# Patient Record
Sex: Male | Born: 1935 | Race: Black or African American | Hispanic: No | Smoking: Former smoker
Health system: Southern US, Community
[De-identification: ages and names within clinical notes are randomized; demographics above are authoritative.]

## PROBLEM LIST (undated history)

## (undated) DIAGNOSIS — I509 Heart failure, unspecified: Secondary | ICD-10-CM

## (undated) DIAGNOSIS — N289 Disorder of kidney and ureter, unspecified: Secondary | ICD-10-CM

## (undated) DIAGNOSIS — F0390 Unspecified dementia without behavioral disturbance: Secondary | ICD-10-CM

## (undated) DIAGNOSIS — I10 Essential (primary) hypertension: Secondary | ICD-10-CM

## (undated) HISTORY — PX: CARDIAC VALVE SURGERY: SHX40

## (undated) HISTORY — PX: FEMORAL ARTERY STENT: SHX1583

## (undated) HISTORY — PX: CARDIAC SURGERY: SHX584

---

## 1998-05-05 ENCOUNTER — Inpatient Hospital Stay (HOSPITAL_COMMUNITY): Admission: AD | Admit: 1998-05-05 | Discharge: 1998-05-20 | Payer: Self-pay | Admitting: *Deleted

## 1998-07-14 ENCOUNTER — Ambulatory Visit (HOSPITAL_COMMUNITY): Admission: RE | Admit: 1998-07-14 | Discharge: 1998-07-14 | Payer: Self-pay | Admitting: *Deleted

## 1998-12-09 ENCOUNTER — Encounter: Payer: Self-pay | Admitting: Cardiology

## 1998-12-09 ENCOUNTER — Ambulatory Visit (HOSPITAL_COMMUNITY): Admission: RE | Admit: 1998-12-09 | Discharge: 1998-12-09 | Payer: Self-pay | Admitting: Cardiology

## 1998-12-28 ENCOUNTER — Encounter: Payer: Self-pay | Admitting: Urology

## 1998-12-28 ENCOUNTER — Ambulatory Visit (HOSPITAL_COMMUNITY): Admission: RE | Admit: 1998-12-28 | Discharge: 1998-12-28 | Payer: Self-pay | Admitting: Urology

## 1999-12-09 ENCOUNTER — Encounter (HOSPITAL_BASED_OUTPATIENT_CLINIC_OR_DEPARTMENT_OTHER): Payer: Self-pay | Admitting: General Surgery

## 1999-12-13 ENCOUNTER — Ambulatory Visit (HOSPITAL_COMMUNITY): Admission: RE | Admit: 1999-12-13 | Discharge: 1999-12-13 | Payer: Self-pay | Admitting: General Surgery

## 1999-12-13 ENCOUNTER — Encounter (INDEPENDENT_AMBULATORY_CARE_PROVIDER_SITE_OTHER): Payer: Self-pay | Admitting: Specialist

## 2000-04-07 ENCOUNTER — Ambulatory Visit (HOSPITAL_COMMUNITY): Admission: RE | Admit: 2000-04-07 | Discharge: 2000-04-07 | Payer: Self-pay | Admitting: Cardiology

## 2000-11-01 ENCOUNTER — Ambulatory Visit (HOSPITAL_COMMUNITY): Admission: RE | Admit: 2000-11-01 | Discharge: 2000-11-01 | Payer: Self-pay | Admitting: Cardiology

## 2001-12-20 ENCOUNTER — Encounter (INDEPENDENT_AMBULATORY_CARE_PROVIDER_SITE_OTHER): Payer: Self-pay | Admitting: *Deleted

## 2001-12-20 ENCOUNTER — Other Ambulatory Visit: Admission: RE | Admit: 2001-12-20 | Discharge: 2001-12-20 | Payer: Self-pay | Admitting: Urology

## 2001-12-26 ENCOUNTER — Encounter: Payer: Self-pay | Admitting: Urology

## 2001-12-26 ENCOUNTER — Encounter: Admission: RE | Admit: 2001-12-26 | Discharge: 2001-12-26 | Payer: Self-pay | Admitting: Urology

## 2002-02-07 ENCOUNTER — Ambulatory Visit: Admission: RE | Admit: 2002-02-07 | Discharge: 2002-02-07 | Payer: Self-pay | Admitting: Cardiology

## 2002-04-15 ENCOUNTER — Ambulatory Visit (HOSPITAL_COMMUNITY): Admission: RE | Admit: 2002-04-15 | Discharge: 2002-04-15 | Payer: Self-pay | Admitting: Neurosurgery

## 2002-04-15 ENCOUNTER — Encounter: Payer: Self-pay | Admitting: Neurosurgery

## 2002-06-12 ENCOUNTER — Ambulatory Visit (HOSPITAL_COMMUNITY): Admission: RE | Admit: 2002-06-12 | Discharge: 2002-06-12 | Payer: Self-pay | Admitting: Cardiology

## 2002-06-25 ENCOUNTER — Encounter: Payer: Self-pay | Admitting: Neurosurgery

## 2002-06-25 ENCOUNTER — Encounter: Admission: RE | Admit: 2002-06-25 | Discharge: 2002-06-25 | Payer: Self-pay | Admitting: Neurosurgery

## 2002-07-09 ENCOUNTER — Encounter: Payer: Self-pay | Admitting: Neurosurgery

## 2002-07-09 ENCOUNTER — Encounter: Admission: RE | Admit: 2002-07-09 | Discharge: 2002-07-09 | Payer: Self-pay | Admitting: Neurosurgery

## 2003-02-20 ENCOUNTER — Ambulatory Visit: Admission: RE | Admit: 2003-02-20 | Discharge: 2003-02-20 | Payer: Self-pay | Admitting: Cardiology

## 2003-02-20 ENCOUNTER — Encounter (INDEPENDENT_AMBULATORY_CARE_PROVIDER_SITE_OTHER): Payer: Self-pay | Admitting: Cardiology

## 2004-01-28 ENCOUNTER — Inpatient Hospital Stay (HOSPITAL_COMMUNITY): Admission: EM | Admit: 2004-01-28 | Discharge: 2004-02-14 | Payer: Self-pay | Admitting: Emergency Medicine

## 2004-01-30 ENCOUNTER — Encounter (INDEPENDENT_AMBULATORY_CARE_PROVIDER_SITE_OTHER): Payer: Self-pay | Admitting: Cardiology

## 2004-02-06 ENCOUNTER — Encounter (INDEPENDENT_AMBULATORY_CARE_PROVIDER_SITE_OTHER): Payer: Self-pay | Admitting: Specialist

## 2004-03-01 ENCOUNTER — Encounter (HOSPITAL_COMMUNITY): Admission: RE | Admit: 2004-03-01 | Discharge: 2004-05-27 | Payer: Self-pay | Admitting: Cardiology

## 2004-03-04 ENCOUNTER — Encounter: Admission: RE | Admit: 2004-03-04 | Discharge: 2004-03-04 | Payer: Self-pay | Admitting: Cardiothoracic Surgery

## 2005-09-09 ENCOUNTER — Encounter: Admission: RE | Admit: 2005-09-09 | Discharge: 2005-09-09 | Payer: Self-pay | Admitting: Nephrology

## 2005-09-25 IMAGING — CR DG CHEST 2V
2 series · 2 of 2 positions shown · non-contrast
Comparison: none

CLINICAL DATA: Pulmonary edema.  Follow-up coronary artery bypass grafts and valve replacement.
 TWO VIEW CHEST 
 PA and lateral views of the chest are made and are compared to the previous study of 02/10/04 at 8588 hours and show slightly poorer left basilar aeration.  There is also slight blunting of the left costophrenic angle.  There are again noted the coronary artery bypass grafts and the prostatic aortic valve.  No pneumothorax is seen.

[view not recorded (1 of 2)]
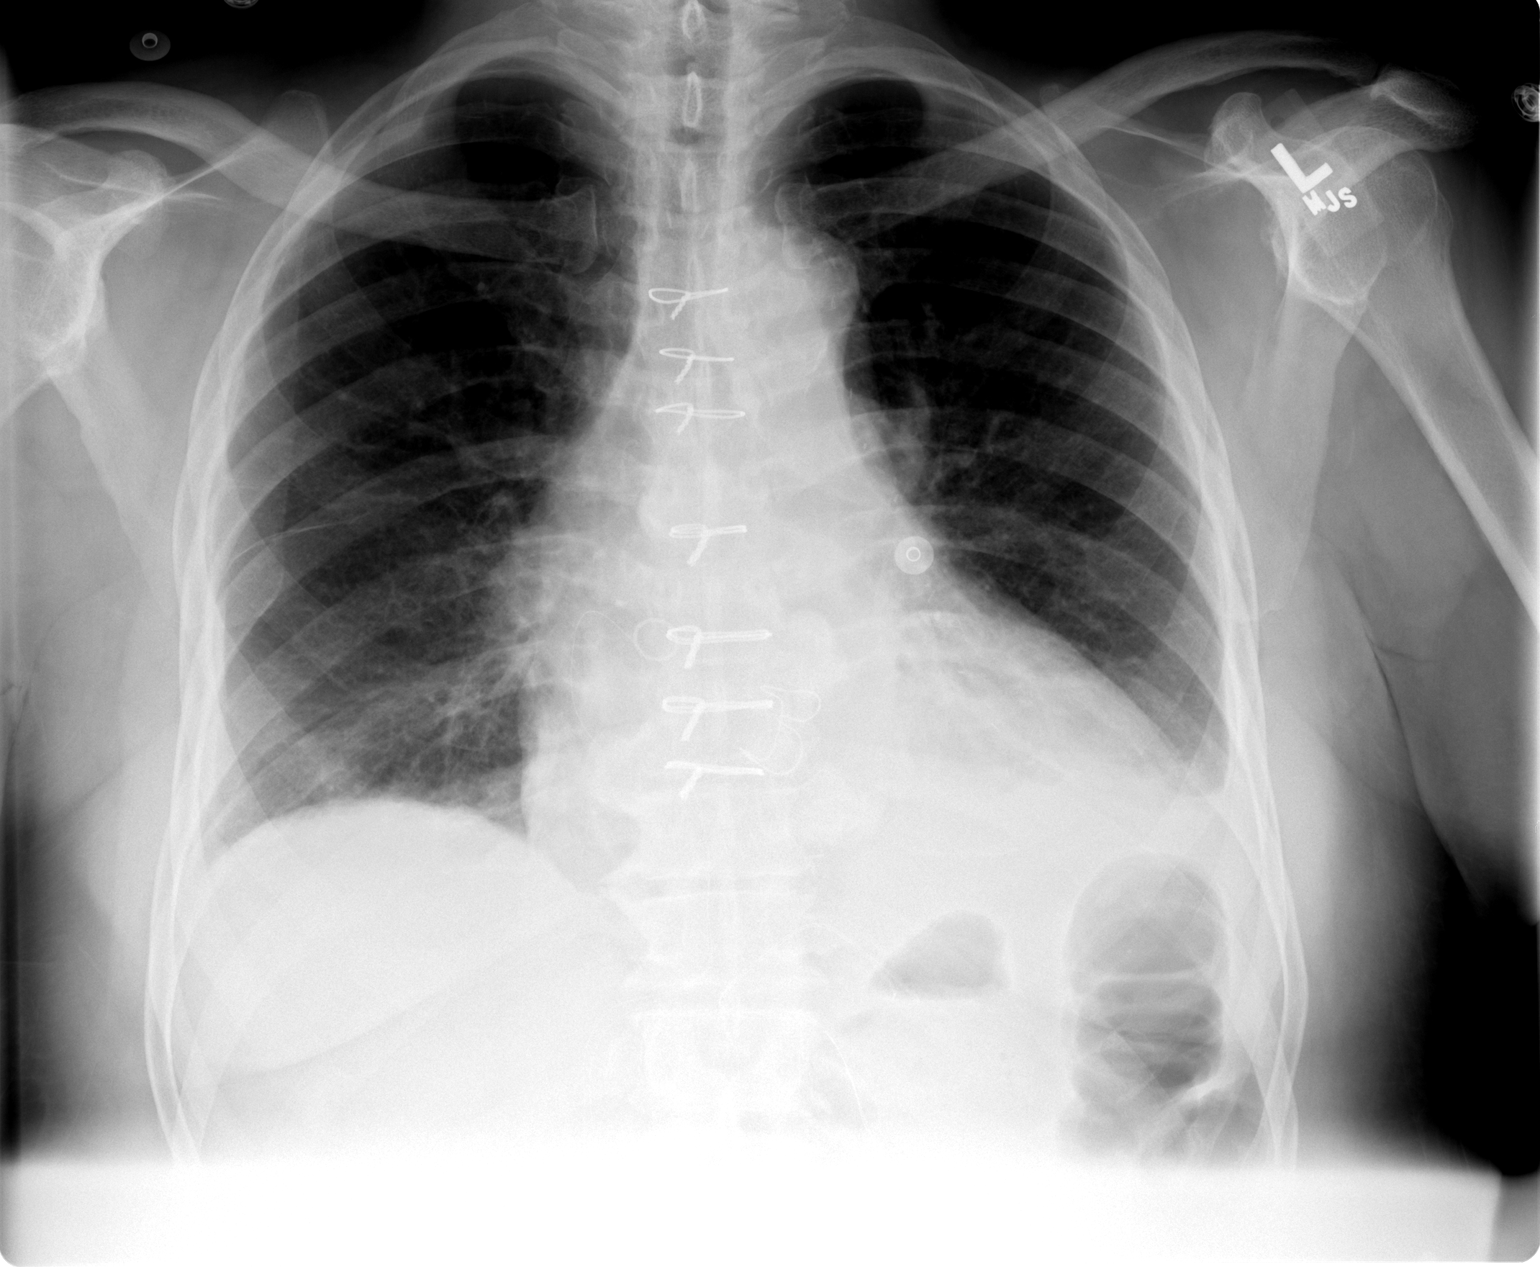

[view not recorded (2 of 2)]
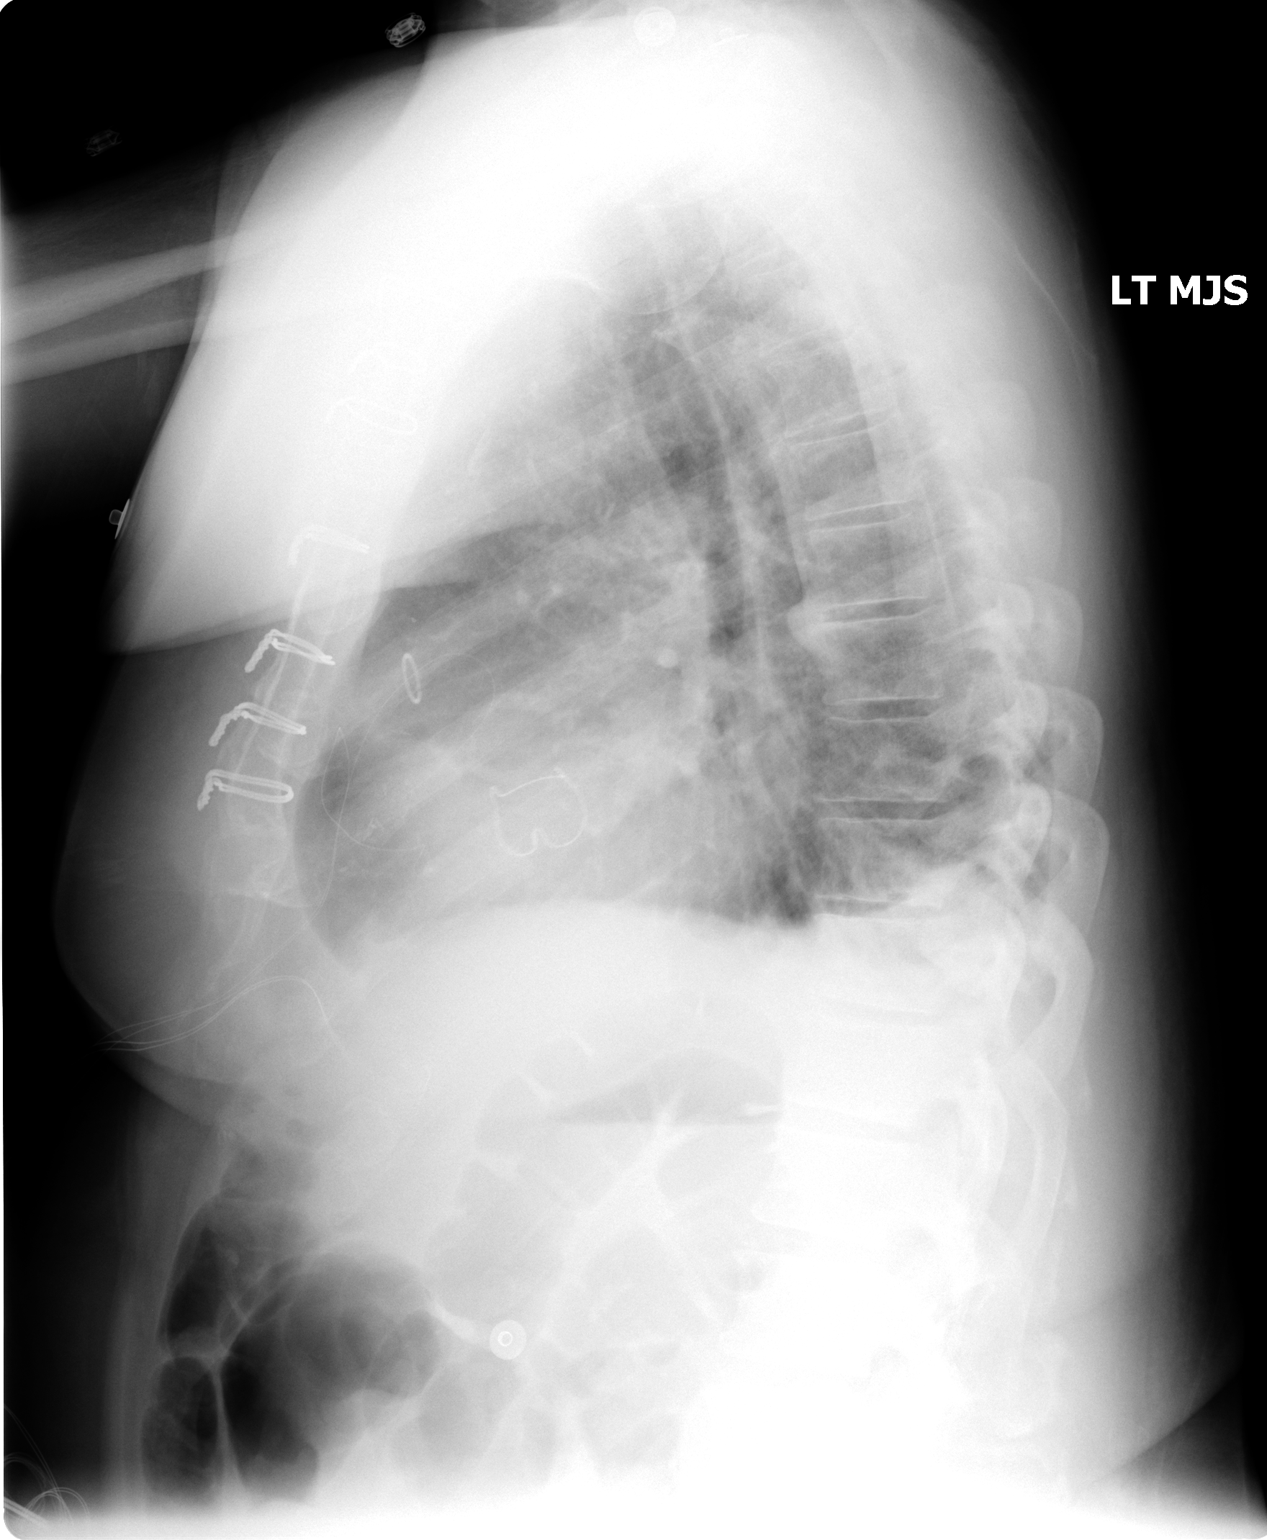

[2 of 2 positions shown; findings below may reference images not displayed]

IMPRESSION: Slightly poorer left basilar aeration.

## 2005-12-05 ENCOUNTER — Encounter: Admission: RE | Admit: 2005-12-05 | Discharge: 2005-12-05 | Payer: Self-pay | Admitting: Cardiology

## 2006-08-31 ENCOUNTER — Emergency Department (HOSPITAL_COMMUNITY): Admission: EM | Admit: 2006-08-31 | Discharge: 2006-08-31 | Payer: Self-pay | Admitting: Emergency Medicine

## 2006-09-03 ENCOUNTER — Inpatient Hospital Stay (HOSPITAL_COMMUNITY): Admission: EM | Admit: 2006-09-03 | Discharge: 2006-09-08 | Payer: Self-pay | Admitting: Emergency Medicine

## 2006-09-07 ENCOUNTER — Encounter: Admission: RE | Admit: 2006-09-07 | Discharge: 2006-09-07 | Payer: Self-pay | Admitting: Neurosurgery

## 2006-10-27 ENCOUNTER — Encounter: Admission: RE | Admit: 2006-10-27 | Discharge: 2006-10-27 | Payer: Self-pay | Admitting: Neurosurgery

## 2006-11-15 ENCOUNTER — Encounter: Admission: RE | Admit: 2006-11-15 | Discharge: 2006-11-15 | Payer: Self-pay | Admitting: Neurosurgery

## 2008-07-03 ENCOUNTER — Encounter: Admission: RE | Admit: 2008-07-03 | Discharge: 2008-07-03 | Payer: Self-pay | Admitting: Nephrology

## 2011-03-11 NOTE — Op Note (Signed)
NAME:  [name], [name]                     ACCOUNT NO.:  [id number]   MEDICAL RECORD NO.:  [id number]                   PATIENT TYPE:  INP   LOCATION:  2303                                 FACILITY:  [facility]   PHYSICIAN:  Gwenith Daily. Tyrone Sage, M.D.            DATE OF BIRTH:  [birth year]   DATE OF PROCEDURE:  02/06/2004  DATE OF DISCHARGE:                                 OPERATIVE REPORT   PREOPERATIVE DIAGNOSIS:  Critical aortic stenosis and coronary occlusive  disease.   POSTOPERATIVE DIAGNOSIS:  Critical aortic stenosis and coronary occlusive  disease.   SURGICAL PROCEDURE:  Aortic valve replacement with a #23 pericardial tissue  valve and coronary artery bypass grafting x2 with the left internal mammary  artery to the left anterior descending coronary artery and reversed  saphenous vein graft to the distal right coronary artery with right thigh  endovein harvesting.   SURGEON:  Gwenith Daily. Tyrone Sage, M.D.   FIRST ASSISTANT:  Rowe Clack, P.A.-C.   BRIEF HISTORY:  The patient is a 77 year old male who presented with a  sudden onset of pulmonary edema.  He was stabilized medically and evaluated  and found to have critical aortic stenosis at catheterization and also at  least a 70%  mid right coronary artery lesion and 50% to 60% lesions of  haziness just prior to and just distal to a stent placed 2-3 years ago in  the proximal LAD.  The patient had evidence of significant left ventricular  hypertrophy.  Because of his symptoms and critical aortic stenosis, aortic  valve replacement was recommended along with coronary artery bypass  grafting.  The patient agreed and signed informed consent.   DESCRIPTION OF PROCEDURE:  With Swan-Ganz and arterial line and monitors in  place, the patient underwent general endotracheal anesthesia without  incident.  Skin of the chest and legs was prepped with Betadine and draped  in the usual sterile manner.  A transesophageal echo probe was  placed and  confirmed that the patient did have some mitral regurgitation, however, the  leaflets were all intact and it was felt that this was a minor enough  regurgitation that replacement of the critical aortic stenosis would be all  that was necessary.  The patient was prepped and draped in the usual sterile  manner.  Using a [employer] endovein harvesting system, vein was harvested from  the right thigh.  A median sternotomy was performed.  The left internal  mammary artery was dissected down as a pedicle graft.  The distal artery was  divided and had good blood flow.  The pericardium was opened.  Overall  ventricular function was preserved, however, he did have evidence of severe  left ventricular hypertrophy.  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  With some difficulty, the patient was  then systemically heparinized.  The ascending aorta and  the right atrium  were cannulated.  The patient was placed on cardiopulmonary bypass at 2.4  L/min per sq m.  Right superior pulmonary vein vent was placed.  The  patient's body temperature was cooled to 30 degrees.  Aortic cross-clamp was  applied and 500 mL of cold blood and potassium cardioplegia were  administered with rapid diastolic arrest of the heart.  Myocardial septal  temperature was monitored throughout the cross-clamp period.  Attention was  turned first to the distal right coronary artery, which was opened.  Using a  running 7-0 Prolene, a distal anastomosis was performed with the second  reversed saphenous vein graft.  Attention was then turned to the mid-to-  distal left anterior descending coronary artery, which was opened.  Using a  running 8-0 Prolene, the left internal mammary artery was anastomosed to the  left anterior descending coronary.  Additional cold blood cardioplegia was  administered.  Attention was  then turned to the aortic valve.  A transverse  aortotomy was performed and this gave good visualization of a tricuspid,  highly calcified valve with the left and right coronary cusps almost  completely fused.  The valve was excised and all calcific debris was  removed.  The annulus was sized for a 23 pericardial tissue valve.  Consideration for a 25 Magnum was attempted, however, this valve still sized  for a 23.  Using #2 Tycron pledgeted sutures with the pledgets on the  ventricular surface, the valve was secured in placed without any impingement  on the coronary ostium.  Then the aorta was inspected for any loose calcific  debris.  The aortotomy was then closed with horizontal mattress, 3- and 4-0  Prolene sutures over felt strips.  With the cross-clamp still in place, a  single punch aortotomy was performed and a right proximal anastomosis of the  vein graft was performed.  The heart was allowed to passively fill and de-  air prior to completion of the final vein proximal anastomosis.  Aortic  cross-clamp was removed with a total cross-clamp time of 117 minutes.  The  patient spontaneously converted to a sinus rhythm.  His body temperature was  rewarmed to 37 degrees.  He had been maintained on renal-dose dopamine  throughout the case because of unknown preoperative renal insufficiency.  He  was ventilated and weaned from cardiopulmonary bypass without difficulty.  The transesophageal echo showed trace to 1+ MR, much improved from  preoperatively.  The aortic valve was functioning normally.  The patient was  decannulated in the usual fashion.  Protamine sulfate was administered.  With the operative field hemostatic, 2 atrial and 2 ventricular pacing wires  were applied and graft markers applied, left pleural tube and 2 mediastinal  tubes left in place.  The sternum was closed with #6 stainless steel wires,  fascia closed with interrupted 0 Vicryl and running 3-0 Vicryl in  the subcutaneous tissue, 4-0 subcuticular stitch in the skin edges.  Dry  dressings were applied.  Sponge and needle count was reported as correct at  completion of the procedure.   The patient had implanted a pericardial tissue value by [employer], model #2700, serial number [id number], 23-mm size.                                               Gwenith Daily Tyrone Sage, M.D.  EBG/MEDQ  D:  02/09/2004  T:  02/09/2004  Job:  [id number]   cc:   Eduardo Osier. Sharyn Lull, M.D.  [address]  [place]  [place] [address]  Fax: [phone]   [facility]

## 2011-03-11 NOTE — Discharge Summary (Signed)
NAME:  [name] [name], [name] [name]           ACCOUNT NO.:  [id number]   MEDICAL RECORD NO.:  [id number]          PATIENT TYPE:  INP   LOCATION:  1409                         FACILITY:  [facility]   PHYSICIAN:  Eduardo Osier. Sharyn Lull, M.D. DATE OF BIRTH:  Feb 24, 1933   DATE OF ADMISSION:  09/02/2006  DATE OF DISCHARGE:  09/08/2006                               DISCHARGE SUMMARY   ADMITTING DIAGNOSES:  1. Severe degenerative joint disease with herniation.  2. Coronary artery disease.  3. Hypertension.  4. Noninsulin-dependent diabetes mellitus controlled by diet.  5. Hypercholesteremia.  6. Mild renal insufficiency.   FINAL DIAGNOSES:  1. Severe degenerative joint disease with herniation.  2. Resolving bronchitis.  3. Hypertension.  4. Coronary artery disease status post aortic valve replacement.  5. Noninsulin-dependent diabetes mellitus controlled by diet.  6. Hypercholesteremia.  7. Mild renal insufficiency.  8. Status post nonsustained ventricular tachycardia.   DISCHARGED HOME MEDICATIONS:  1. Enteric-coated aspirin 81 mg 2 tablets daily.  2. Toprol XL 100 mg 1 tablet daily.  3. Altace 10 mg 1 capsule daily.  4. Norvasc 5 mg 1 tablet daily.  5. Avapro 300 mg 1 tablet daily.  6. Vytorin 10/40 1 tablet daily.  7. Hydrocodone/APAP 5/500 1-2 tablets every 8 hours as needed for      pain.   DIET:  Low salt, low cholesterol, 1800 calories ADA diet.  The patient  has been advised to avoid sweets and increase activity slowly as  tolerated with the help of crutches and walker.  OT, PT and home health  aid has been arranged.  Follow up with me in 1 week.  Follow up with Dr.  Sampson Goon. Venetia Maxon in 1 week.  Condition at discharge is stable.   BRIEF HISTORY AND HOSPITAL COURSE:  [name] is a 77 year old black  male with past medical history significant for severe degenerative joint  disease being followed by Dr. Franky Macho and Dr. Roxan Hockey in the past and  [facility], history of  CAD, history of severe aortic  stenosis requiring AVR, hypertension.  He was admitted by Dr. Shana Chute  because of severe low back pain and left leg pain.  The patient was seen  by Dr. Franky Macho and Dr. Roxan Hockey for the last 3-5 years and was treated  conservatively with epidural injection with improvement in his symptoms  in the past.  Denies any history of trauma or fall.  Denies any bladder  and bowel incontinence.   SOCIAL HISTORY:  Married, lives with his wife.  No history of smoking or  alcohol abuse.   PAST MEDICAL HISTORY:  As above.   PAST SURGICAL HISTORY:  He had CABG.  He had AVR a few years ago.  No  known drug allergies.   MEDICATION:  At home, he is on Altace, Avapro, Norvasc, Vicodin.   EXAMINATION:  His blood pressure was 192/97, pulse was 79.  Neck was  supple, no JVD.  Lungs were clear to auscultation without rhonchi or  rales.  CARDIOVASCULAR EXAM:  S1-S2 was normal.  There was metallic heart  sounds.  ABDOMEN:  Soft.  Bowel sounds present, nontender.  EXTREMITIES:  There was no clubbing, cyanosis or edema.  NEURO EXAM:  Was grossly intact.   LABS:  Cholesterol was 204, LDL 123, HDL 68, AST was 46, ALP was 43, ALT  was 43, ALP was 55, total bili 1.1, magnesium was 1.9.  Sodium 140,  potassium 4.0, chloride 106, bicarb 24, glucose 141, BUN 21, creatinine  1.6.  Hemoglobin was 15.9, hematocrit 46.6, white count of 9.0.  Repeat  CBC on September 05, 2006:  Hemoglobin was 15.3, hematocrit 45.1, white  count of 10.3.  On September 07, 2006:  Hemoglobin was 14.6, hematocrit  43, white count has come down to 7.6.  His MRI of LS spine showed  advanced multilevel degenerative spondylotic changes, multiple sites of  stenosis involving central canal, lateral recess and foramina, multiple  disk herniations.  There is a large primarily centered HNP at L2-L3 and  large HNP paracentrally and posterolateral knee on right L3-L4.  Essentially obliteration of L4-L5 disc with findings  suggestive of prior  interbody fusion.  Foraminal stenosis is noted at multiple levels  including on the right at L2-L3, L4-L5, and particularly L3-L4 and on  the left at L2-L3, L3-L4 and L5-S1 and to a lesser degree L4-L5.  Chest  x-ray showed cardiomegaly with no congestive heart failure.  Neuro found  scarring in the left lower chest, questionable nodular opacity in the  posterior aspect of the chest on the left.  The patient subsequently had  CT of the chest to evaluate for questionable capacity.  On September 04, 2006 it showed pleural thickening and parenchymal scarring in the  posterior and posterolateral hemithorax.  There was no focal pulmonary  mass.  There was dilatation of ascending aorta measuring 5 cm in  greatest diameter.   BRIEF HOSPITAL COURSE:  The patient was admitted to telemetry unit.  Neurosurgical consultation was obtained with Dr. Venetia Maxon.  The patient was  advised to go L3 selective nerve block with steroid injection.  The  patient subsequently underwent CT-guided L3 nerve block with improvement  in his symptoms.  The patient had a cough with yellowish phlegm and was  started on Avelox with improvement in his symptoms.  Due to questionable  left lower lobe opacity, the patient underwent CT of the chest which  showed no evidence of pulmonary mass.  OT/PT consultation was obtained.  The patient has been ambulating in hallway with assistance.  The patient  will be discharged home.  The patient was discharged home on above  medications and will be followed up by neurosurgery in 1 week and with  me in 2 weeks.  The patient was advised to go to the ER if he develops  any weakness in his leg or any incontinence.  The patient was discharged  in stable condition on September 08, 2006.           ______________________________  Eduardo Osier. Sharyn Lull, M.D.    MNH/MEDQ  D:  10/08/2006  T:  10/08/2006  Job:  [id number]

## 2011-03-11 NOTE — Consult Note (Signed)
NAME:  [name] [name], [name] [name]                     ACCOUNT NO.:  [id number]   MEDICAL RECORD NO.:  [id number]                   PATIENT TYPE:  INP   LOCATION:  3732                                 FACILITY:  [facility]   PHYSICIAN:  Gwenith Daily. Tyrone Sage, M.D.            DATE OF BIRTH:  Mar 12, 1933   DATE OF CONSULTATION:  02/02/2004  DATE OF DISCHARGE:                                   CONSULTATION   REQUESTING PHYSICIAN:  Dr. Sharyn Lull.   FOLLOWUP CARDIOLOGIST:  Dr. Sharyn Lull.   PRIMARY CARE PHYSICIAN:  Dr. Sharyn Lull.   REASON FOR CONSULTATION:  Severe aortic stenosis with flash pulmonary edema.   HISTORY OF PRESENT ILLNESS:  The patient is a 77 year old male who presents  with flash pulmonary edema manifested by severe shortness of breath. The  patient was noted he was driving to a local pharmacy to pick up some  medication when he became acutely short of breath with cough and  diaphoretic. EMS was called. On room air, his O2 saturations were 86%, and  he was brought to the emergency room, given BiPAP, morphine, nitroglycerin,  Lasix, and supplemental oxygen. His symptoms improved. Peak CK was 160 with  a MB of 2.9. Troponins were less than 0.5 x3.   The patient denies any syncope or near syncope. He denies any previous  shortness of breath; however, he has been retired for the past six years,  and his wife notes that he is not very active and primarily stays at home  and watches TV.   Previous cardiac history includes cardiac catheterization 1999. At that  time, he had stenosis of the LAD, and a stent was placed in the LAD. Cardiac  risk factors include hypertension. The patient was markedly hypertensive at  the time of admission with a diastolic pressure of 120. He has  hyperlipidemia. Has a positive family history for cardiac disease and  strokes. Both his mother and his father died at elderly ages with multiple  strokes. He has had one brother who has had bypass surgery. He denies  diabetes, is a current smoker one to two cigarettes a day, had smoked up to  a pack a day for 20 years but eight years ago cut back to one or two per  day. He denies claudication.   He does have baseline renal insufficiency in February 2000. His creatinine  was 1.8 on admission; on this admission 2.2; and decreased to 2 just today  prior to catheterization.   Past surgical history includes hernia repair on the right in 2000.   SOCIAL HISTORY:  The patient's married. He is retired from ____________.  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 Denies alcohol use.   MEDICATIONS ON ADMISSION:  1. Altace 10 mg a day.  2. Toprol-XL 50 mg a day.  3. Imdur 60 mg a day.  4. Lipitor 40 mg a day.  5. One aspirin a day.  ALLERGIES:  Denies any drug allergies.   REVIEW OF SYSTEMS:  CARDIAC:  The patient specifically denies chest pain,  resting shortness of breath. Does note exertional shortness of breath as  noted above. Denies orthopnea, syncope, palpitations, or lower extremities  edema. GENERAL:  The patient does note some constitutional symptoms. His  family notes that over the past several months he has not looked as well as  he had and has had an increasing cough for several months, nonproductive. He  denies any fevers or chills. GASTROINTESTINAL:  Denies any blood in his  stool. NEUROLOGICAL:  Denies TIAs or amaurosis. MUSCULOSKELETAL:  Has lower  lumbar disk disease which prevents him from doing heavy physical work. Does  have narrowing at L4-L5. GENITOURINARY:  He has had a history of prostate  difficulty in the past followed by Dr. Brunilda Payor. Denies any recent infections.  Denies any hematologic abnormalities. Denies polyuria, polydipsia, or other  evidence of hypothyroidism. Denies psychiatric history. Denies claudication.  HEENT:  The patient has partial dental replacements, see the dentist on a  regular basis-the last time was two months ago. He does wear glasses for   reading.   PHYSICAL EXAMINATION:  VITAL SIGNS:  His blood pressure is 114/64, pulse is  84 and regular, respiratory rate is 18. He is 5 feet 9 inches tall, weighs  214 pounds on admission and 209 yesterday.  GENERAL:  The patient is awake and neurologically intact and able relate his  history.  HEENT:  Pupils are equal, round, and reactive to light.  NECK:  With radiating bruits heard over both carotid arteries with brisk  upstroke.  LUNGS:  Clear bilaterally.  CARDIAC:  Reveals slightly irregular heart rhythm with a harsh holosystolic  murmur heard along the right sternal border radiating toward the apex of the  heart.  ABDOMEN:  Benign without palpable masses or organomegaly.  NEUROLOGICAL:  Grossly intact.  LYMPH NODES:  He has no cervical, supraclavicular lymph nodes.  SKIN:  Without ischemic changes.  VASCULAR:  Reveals right DP 1+, left DP 2+; right PT not palpable, left PT  1+. The right groin catheter site is without significant hematoma.   LABORATORY DATA:  White count 9.6, hemoglobin 15.5, hematocrit 46.4,  platelet count 144. PT 13.4, INR 1.0, PTT 30. Total cholesterol 148,  triglycerides 89, HDL 50, LDL 80. Sodium 141, potassium 3.9, BUN 28,  creatinine 2.1, glucose 108. On admission, his creatinine was 2.2. BNP is  elevated to 534. EKG is sinus rhythm with PVCs.   The patient had abdominal MRA which showed no evidence of renal artery  stenosis. 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   Echocardiogram and catheter films were reviewed. There is echographic  evidence of severe aortic stenosis with calculated valve area between 0.68  and 0.74 with preserved LV function At the time of catheterization, his PA  pressures were 53/32, a wedge of 36, cardiac index 2.4, left main is patent;  LAD in the area of the previously placed stent appears to be 50 to 60% narrowed on some views or 30 to 40% on others. The circumflex is small but  without significant disease. The right  coronary artery has a mid 60% lesion  in hit. The distal vessel is small.   IMPRESSION:  The patient admitted with flash pulmonary edema and probably  prodrome of decreasing activity over at least several months secondary to  significant aortic stenosis. The patient has some concomitant coronary  artery disease, though  is not what is participating his symptoms. I have  reviewed with the patient and his wife the diagnosis of critical aortic  stenosis and, because of his onset of symptoms, the need for aortic valve  replacement and consideration of bypass surgery. The risks and options were  discussed with the patient in detail including the risk of death, infection,  stroke myocardial infarction, bleeding, blood transfusion. We also discussed  the advantages and disadvantages of each of mechanical and tissue valves;  because of the patient's age, the best choice would probably  be a tissue valve. The patient is agreeable with this. Because of his  baseline elevated creatinine of 2.2 and catheterization this afternoon, we  will delay surgery until Wednesday, April 13, to ensure that he has no bump  in his creatinine following cardiac catheterization. We also obtained  carotid Doppler studies.                                               Gwenith Daily Tyrone Sage, M.D.    Tyson Babinski  D:  02/02/2004  T:  02/02/2004  Job:  [id number]

## 2011-03-11 NOTE — Cardiovascular Report (Signed)
NAME:  [name] [name], [name] [name]                     ACCOUNT NO.:  [id number]   MEDICAL RECORD NO.:  [id number]                   PATIENT TYPE:  INP   LOCATION:  3732                                 FACILITY:  [facility]   PHYSICIAN:  Mohan N. Sharyn Lull, M.D.              DATE OF BIRTH:  June 20, 1933   DATE OF PROCEDURE:  02/02/2004  DATE OF DISCHARGE:                              CARDIAC CATHETERIZATION   PROCEDURE:  Left and right cardiac catheterization with selective left and  right coronary angiography, insertion of Swan-Ganz catheter via right groin  using Judkins technique.   INDICATIONS:  [name] is a 77 year old black male with past medical  history significant for coronary artery disease, history of myocardial  infarction in the past, status post percutaneous transluminal coronary  angioplasty and stenting in 1999, hypertension.  Had moderately severe  aortic stenosis, hypercholesterolemia, mild renal insufficiency. He came to  the emergency room via EMS.  The patient states while driving to the  pharmacy he developed sudden onset of shortness of breath associated with  diaphoresis.  Bystander called EMS and was brought to the emergency room.  The patient denies any chest pain, nausea, vomiting.  Denies palpitation,  lightheadedness or syncope.  The patient denies any noncompliance to  medication or excessive salty food intake.  Denies any prolonged  immobilization.  States he has been having chest cold off and on with  associated cough and whitish phlegm.  Denies any sore throat, fever or  chills.  The patient was noted to be in acute pulmonary edema.  The patient  received IV Lasix, morphine sulfate, and IV nitrates and O2 by BiPAP with  good diuresis and improvement of symptoms.   PAST MEDICAL HISTORY:  As above, plus history of inferior wall myocardial  infarction.  Had Duet stent 3.0 x 18 mm in 1999.   PAST SURGICAL HISTORY:  Had right inguinal hernia in February 2001.   SOCIAL HISTORY:  He is married and has no children.  Smoked one pack per day  for 20 years, quit about eight years ago.  Now smokes occasionally one to  two cigarettes daily.  No history of alcohol use.  He worked at a [employer].  He has been retired since September 1988.  He was born in [place] and  lives in [place].   FAMILY HISTORY:  Father died of stroke at age of 12.  Mother died at age of  47 due to kidney failure.  One brother had coronary artery bypass grafting.   MEDICATIONS AT HOME:  1. Altace 10 mg p.o. daily.  2. Toprol 50 mg p.o. daily.  3. Imdur 60 mg p.o. daily.  4. Lipitor 40 mg p.o. daily.  5. Baby aspirin 81 mg p.o. daily.   ALLERGIES:  No known drug allergies.   PHYSICAL EXAMINATION:  GENERAL:  He was alert, awake, oriented x3 in mild  respiratory distress on BiPAP.  VITAL SIGNS:  Initial  blood pressure was 197/125, pulse 117, sinus tach on  the monitor.  HEENT:  Conjunctivae pink.  NECK:  Supple.  Positive jugular venous distention.  Decreased carotid  upstroke.  LUNGS:  He has bilateral rales with decreased breath sounds at bases.  CARDIOVASCULAR:  S1, S2 was present.  There was 3/6 systolic murmur and soft  S3 gallop.  ABDOMEN:  Soft.  Bowel sounds are present.  Nontender.  EXTREMITIES:  No clubbing, cyanosis or edema.   EKG showed normal sinus rhythm with septal myocardial infarction age  undetermined, minor ST depression in lateral leads.  There were no acute  ischemic changes noted.  His cardiac enzymes were negative.   BRIEF HOSPITAL COURSE:  The patient was admitted to CCU and was started on  IV heparin and nitrates and Lasix with good diuresis.  Myocardial infarction  was ruled out by serial enzymes and EKG.  The patient had two episodes of  nonsustained VT and was started on p.o. amiodarone.  The patient was also  noted to have p.o. amiodarone.  The patient also underwent MRA of the  abdomen due to severe uncontrolled hypertension and acute  pulmonary edema to  rule out renal artery stenosis which showed no evidence of renal artery  stenosis.  Due to multiple risk factors, severe aortic stenosis with aortic  valve area of 0.67 cm x 2-D echo with mean aortic valve gradient of 45 mmHg  with mildly depressed LV function and new onset of congestive heart failure.  Discussed with patient regarding left cath, its risks; i.e., death,  myocardial infarction, stroke, need for emergency coronary artery bypass  graft, local vascular complications, etc., and consented for the procedure.   PROCEDURE:  After obtaining informed consent, the patient was brought to the  cath lab and was placed on fluoroscopy table. Right groin was prepped and  draped in usual fashion.  2% Xylocaine was used for local anesthesia in the  right groin.  With the help of thin-wall needle, a 6-French arterial sheath  and 8 French venous sheaths were placed.  Both the sheaths were aspirated  and flushed.  Next, 7.5 French thermodilution Swan-Ganz  catheter was  advanced under fluoroscopic guidance up to the RA, RV, PA and wedge  position.  Pressures were recorded in above positions. Then, cardiac output  was done.  Subsequently, the Swan-Ganz catheter was pulled out.  Next, 6-  French left Judkins catheter was advanced over the wire under fluoroscopic  guidance up to the ascending aorta.  Wire was pulled out and the catheter  was aspirated and connected to the manifold. Catheter was further advanced  and engaged into left coronary ostium.  Multiple views of the left system  were taken.  Next, the catheter was disengaged and was pulled out over the  wire and was replaced with 6-French right Judkins catheter which was  advanced over the wire under fluoroscopic guidance to the ascending aorta.  Wire was pulled out, the catheter was aspirated and connected to the manifold.  Catheter was further advanced and engaged into right coronary  ostium.  Multiple views of the  right system were taken.  Next, the catheter  was disengaged and was pulled out over the wire and sheaths were aspirated  and flushed.   FINDINGS:  Left ventriculogram was not done due to documented severe aortic  stenosis and renal insufficiency.  Left main was patent.  LAD has 30-40%  sequential stenosis in proximal, mid and distal region.  Diagonal  one is  moderate size which has 20-30% proximal stenosis which bifurcates into  superior and inferior branch.  Diagonal two and diagonal three were very  small which were patent.  Left circumflex was small which was patent.  RCA  has 30-40% proximal and mid stenosis.   SWAN READINGS:  RA pressure was 11, RV 65/10, PA 53/32, pulmonary capillary  wedge pressure of 36.  Cardiac output was 5.2 liters per minute.  Cardiac  index was 2.4 liters per minute.   The patient received sublingual nitro spray prior to the procedure and 40 mg  of Lasix IV at the end of the procedure.  The patient tolerated the  procedure well and there were no complications.  The patient was transferred  to recovery room in stable condition.   PLAN:  Get CVTS consult for possible aortic valve replacement.                                               Eduardo Osier. Sharyn Lull, M.D.    MNH/MEDQ  D:  02/02/2004  T:  02/02/2004  Job:  [id number]

## 2011-03-11 NOTE — Consult Note (Signed)
NAME:  [name] [name], [name] [name]                     ACCOUNT NO.:  [id number]   MEDICAL RECORD NO.:  [id number]                   PATIENT TYPE:  INP   LOCATION:  3732                                 FACILITY:  [facility]   PHYSICIAN:  Terrial Rhodes, M.D.             DATE OF BIRTH:  Apr 13, 1933   DATE OF CONSULTATION:  02/03/2004  DATE OF DISCHARGE:                                   CONSULTATION   REQUESTING PHYSICIAN:  Dr. Tyrone Sage.   PRIMARY CARE PHYSICIAN:  Dr. Sharyn Lull.   REASON FOR CONSULTATION:  Increasing creatinine.   HISTORY OF PRESENT ILLNESS:  The patient is a 77 year old black male with a  history of CAD status post MI and PCI, hypertension, aortic stenosis, and  renal insufficiency who underwent a cardiac catheterization April 11  following admission for acute shortness of breath associated with  diaphoresis but without chest pain, nausea, or vomiting. The patient  reported outpatient compliance with his medications. He received IV Lasix  with good diuresis and improvement of symptoms. An acute MI was ruled out by  serial enzymes and EKGs. During hospitalization, he has had two episodes of  nonsustained ventricular tachycardia and has been placed on amiodarone. A  MRA  of the abdomen was done secondary to his severe hypertension (diastolic  blood pressure of 120 on admission) and showed no renal artery stenosis. A  cardiac catheterization showed 30 to 40% stenosis of LAD, 20 to 30% stenosis  of diagonal #1, and 30 to 40% stenosis of RCA. Plan is for aortic valve  replacement and CABG on April 13, but CVTS requests renal consult for  elevated creatinine.   The patient denies any history of kidney problems except occasionally being  told by his primary that his kidneys are not working normally. He is  currently without chest pain, shortness of breath, or difficulties voiding.   PAST MEDICAL HISTORY:  1. CAD status post inferior wall MI, PCI/stenting (1999) and status post  catheterization (February 02, 2004).  2. Hypertension.  3. Severe aortic stenosis.  4. Hypercholesterolemia.  5. Renal insufficiency with unknown baseline.   PAST SURGICAL HISTORY:  1. PCI/stenting in 1999.  2. Right inguinal hernia repair February 2001.   MEDICATIONS:  Outpatient medications:  1. Altace 10 mg p.o. daily.  2. Toprol 50 mg p.o. daily.  3. Imdur 60 mg p.o. daily.  4. Lipitor 40 mg p.o. daily.  5. Aspirin 81 mg p.o. daily.   Current inpatient medications:  1. Metoprolol.  2. K-Dur.  3. Protonix.  4. Aspirin.  5. Lipitor.  6. Xopenex.  7. Lasix.  8. Amiodarone.  9. Nitroglycerin patch.  10.      Rocephin (day #4).  11.      Morphine sulfate as needed.  12.      Normal saline lock IV.   ALLERGIES:  No known drug allergies.   SOCIAL HISTORY:  The patient is married and has no children. She  now smokes  1 to 2 cigarettes a day for the past 8 years after smoking 1 pack a day for  20 years previously. No history of alcohol use. He is a retried Nutritional therapist.   FAMILY HISTORY:  His father died of a stroke at age 28. His mother died at  age 41 and also suffered from strokes and heart valve problems. His brother  is status post CABG. The patient denies any family history of kidney  problems.   REVIEW OF SYSTEMS:  No nausea or vomiting, dyspnea on exertion, lower  extremity edema. No hematuria or dysuria. He has had increased urine output  secondary to his Lasix.   PHYSICAL EXAMINATION:  VITAL SIGNS:  Current temperature 97.3-maximum  temperature 97.7, blood pressure 117/61 with systolics in the 110s to 130s,  heart rate 77, respiratory rate 20, O2 saturation 100% on room air, weight  209.4 pounds. Urine output recorded at 650 cc in 24 hours.  GENERAL:  He is a well kept older black male, pleasant, no acute distress,  ambulating without difficulty.  HEENT:  PERRL, EOMI. Mucous membranes are clear without lesions.  NECK:  No JVD. Strong carotid upstrokes bilaterally.   CARDIOVASCULAR:  Harsh holosystolic murmur radiating up into the carotid.  Difficulty hearing S2. Regular rate. Two plus and symmetrical radial pulses.  LUNGS:  Clear to auscultation bilaterally. No wheezes, rhonchi, or rales.  Good air flow.  ABDOMEN:  Obese, soft, nontender, nondistended, normal bowel sounds.  EXTREMITIES:  No edema. Moves all extremities equally.   LABORATORY DATA:  UA showed a specific gravity of 1.023, pH 5, small blood,  was otherwise negative. Urine micro showed rare squames, 0 to 2 red blood  cells, no white blood cells or bacteria. White count 8.9, hemoglobin 15,  hematocrit 44, platelets 130. Serum sodium 142, potassium 4.8, chloride 108,  bicarb 26, BUN elevated at 28, creatinine elevated at 2.3, glucose 109,  calcium 9. Total protein 6.3, albumin low at 2.8. The rest of the liver  panel normal. Mag 1.8 on April 8. TSH within normal limits. Imaging:  MRA of  the abdomen on April 8 showed no evidence of renal artery stenosis. Small  renal cysts were noted.   Two-view chest x-ray on April 8 showed improving CHF. Cardiomegaly. Small  bilateral pleural effusions, left greater than right. CT of the abdomen with  and without contrast in March of 2003 showed renovascular calcification  bilaterally. No hydronephrosis. Several lower attenuation structures  consistent with renal cyst (left greater than right) about 2 cm in maximum  diameter.   ASSESSMENT/PLAN:  A 77 year old black male with significant aortic stenosis,  poorly controlled hypertension, history of renal insufficiency admitted for  flash pulmonary edema with planned aortic valve replacement and coronary  artery bypass grafting tomorrow but with elevated creatinine.   1. Renal insufficiency. Creatinine was 2.2 on admission, decreasing to 2 on     April 8, and since then trending up to 2.1 on April 9, 2.2 on April 11,     and 2.3 today. Unsure of his baseline creatinine although in February    2001 he had  a creatinine of 1.5 and a BUN of 18 per hospital computer     records. No renal artery stenosis was seen on MRA. His urine is without     protein but does have a small amount of blood. He is on Lasix secondary     to pulmonary edema with good urine output. He is not  an ACE, ARB, or     NSAID. Would like to see his creatinine plateau prior to surgery.  2. Severe aortic stenosis/coronary artery disease. Plan is for aortic valve     replacement/coronary artery bypass grafting tomorrow. Will discuss with     Dr. Tyrone Sage whether the surgery can be postponed until his creatinine     stabilizes. Management per CVTS and cardiology.  3. Hypertension. Good control in the past 24 hours on current medications.     Follow.  4. Disposition. Per primary team. Will discuss with Dr. Arrie Aran and     follow patient along with you.     Georgina Peer, M.D.                 Terrial Rhodes, M.D.    JM/MEDQ  D:  02/04/2004  T:  02/05/2004  Job:  [id number]   cc:   Gwenith Daily. Tyrone Sage, M.D.  [address]  [place]  [place] [address]   [facility]

## 2011-03-11 NOTE — Op Note (Signed)
[facility]  Patient:    [name] [name], [name] [name]                  MRN: [id number] Proc. Date: 12/13/99 Adm. Date:  [id number] Attending:  Sonda Primes                           Operative Report  PREOPERATIVE DIAGNOSIS:  Right inguinal hernia.  POSTOPERATIVE DIAGNOSIS:  Right direct and indirect inguinal herniae.  PROCEDURE:  Right inguinal herniorrhaphy with Marlex mesh.  SURGEON:  Mardene Celeste. Lurene Shadow, M.D.  ASSISTANT:  Nurse.  ANESTHESIA:  MAC; I used 1% Xylocaine with epinephrine.  NOTE:  This patient is a 77 year old man presenting with a large, sometimes painful groin bulge which is easily reducible on palpation.  He is brought to the operating room now for repair.  DESCRIPTION OF PROCEDURE:  Following induction of satisfactory sedation, with the patient positioned supine, the lower abdomen was prepped and draped to be included in the sterile operative field and then the lower abdominal crease was infiltrated with 1% Xylocaine with epinephrine.  I made the incision just slightly lower than usual because of an area within the lower skin fold which looked as if the area was infected with some dermal fungus.  This incision was then deepened through the kin and subcutaneous tissues and carried down to the external oblique aponeurosis.  Additional injections of Xylocaine were used to maintain analgesia throughout the procedure.  The external oblique aponeurosis was opened down through the external inguinal ring, with protection of the ilioinguinal nerve, which was retracted inferiorly and laterally.  Spermatic cord was elevated and held with a Penrose drain and freed from Hesselbachs triangle.  A very large direct inguinal hernia  sac was located on the anteromedial aspect of the cord and this was dissected free and carried up to the internal ring and opened.  On opening the cord, there was a sliding component made primarily  of intraperitoneal fat, which was dissected free from the sac and reduced into the peritoneal cavity.  The sac was then closed with pursestring sutures of 2-0 silk and the redundant sac was amputated and forwarded for pathologic evaluation.  The Hesselbachs triangle defect was then closed with an onlay patch of Marlex mesh, sewn into the pubic tubercle and carried up along the conjoined tendon, up to the internal ring, and again from the pubic tubercle up along the shelving edge of Pouparts ligament, up to the internal ring, with the  mesh being split so as to allow passage of the cord through the split.  Tails of the mesh were then trimmed and sutured down to the internal oblique muscles. Sponge, instrument and sharp counts were then verified and the wound then closed in layers as follows:  The external oblique aponeurosis was closed over the cord with a running suture of 2-0 Vicryl, Scarpas fascia was closed with running suture of 0 Vicryl and skin was closed with a running suture of 4-0 Monocryl, then reinforced with Steri-Strips.  Sterile dressing was applied.  Anesthetic was reversed. Patient was moved from the operating room to the recovery room in stable condition, having tolerated the procedure well. DD:  12/13/99 TD:  12/13/99 Job: [id number]

## 2011-03-11 NOTE — Consult Note (Signed)
NAME:  [name] [name], ROOP NO.:  [id number]   MEDICAL RECORD NO.:  [id number]          PATIENT TYPE:  INP   LOCATION:  1409                         [facility]ACILITY[facility]   PHYSICIAN:  Danae Orleans. Venetia Maxon, M.D.  DATE OF BIRTH:  [birth year]   DATE OF CONSULTATION:  09/04/2006  DATE OF DISCHARGE:                                   CONSULTATION   REASON FOR CONSULTATION:  Severe left leg pain and low back pain.   HISTORY OF PRESENT ILLNESS:  Lamin Chandley is a 77 year old man with a  long history of low back and left leg pain.  He previously was a patient of  Dr. Corlis Hove, then treated by Dr. Coletta Memos, both from my  practice.  The patient has had multiple episodes of left leg pain managed  with bedrest and epidural steroid injections.  He was hospitalized by Dr.  Roxan Hockey in 1998.  His last series of epidural steroid injections was in  2004.  He is now admitted with severe left thigh pain increased with  standing.  He denies bowel or bladder dysfunction.  He has a history of  coronary disease, status post aortic valve replacement, and hypertension.  I  have included his old records for review from our practice including his  previous admission note.   PHYSICAL EXAMINATION:  GENERAL:  On examination, the patient is awake, alert  and fully oriented.  He speaks with clear and fluent speech.  He has intact  short and long-term memory.  NEUROLOGIC:  Pupils are equal, round and reactive to light.  Extraocular  movements are intact.  Facial sensation and facial motor intact and  symmetric.  Hearing is intact.  Palate is upgoing.  Shoulder shrug is  symmetric.  Tongue protrudes in the midline.  He has full bilateral upper  extremity and lower extremity strength except for decreased left hip flexion  and knee extension, which are mildly weak.  He has decreased sensation to  pin and light touch in the left thigh.  No hip irritability.  Reflexes are 2  in the biceps, triceps  and brachial radialis, 2 at the right knee, 1 at the  left knee, 2 at the ankles.  He has downgoing toes to stimulation.  He has  mild sciatic notch discomfort on the left.   His MRI demonstrated a central and bi-foraminal stenosis L2-3, and left  facet arthropathy L3-4, severe spondylosis L2-3, L3-4, L4-5 and L5-S1  levels.   IMPRESSION:  Selig Wampole is a 77 year old man with severe left leg  pain with standing and also with low back pain.  He had a similar episode in  1998 and 2004 with severe degenerative changes on his MRI.   RECOMMENDATIONS:  I have recommended to Mr. Enriques that he undergo a left  L3 selective nerve block to see if this will help relieve his pain.  I have  recommended that he undergo steroid injections and if he does not get any  relief, he may require surgical intervention.  I will follow the patient.   Please call me with any questions.  Danae Orleans. Venetia Maxon, M.D.  Electronically Signed     JDS/MEDQ  D:  09/04/2006  T:  09/05/2006  Job:  [id number]   cc:   Osvaldo Shipper. Spruill, M.D.  Eduardo Osier. Sharyn Lull, M.D.

## 2011-03-11 NOTE — Discharge Summary (Signed)
NAME:  [name] [name], [name] [name]           ACCOUNT NO.:  [id number]   MEDICAL RECORD NO.:  [id number]          PATIENT TYPE:  INP   LOCATION:  1409                         FACILITY:  [facility]   PHYSICIAN:  Eduardo Osier. Sharyn Lull, M.D. DATE OF BIRTH:  03/31/1933   DATE OF ADMISSION:  09/02/2006  DATE OF DISCHARGE:  09/08/2006                                 DISCHARGE SUMMARY   ADMITTING DIAGNOSES:  Severe back pain, rule out herniated disk,  hypertension, coronary artery disease, status post CABG, status post AVR.   FINAL DIAGNOSES:  Severe degenerative joint disease, status post femoral  nerve block, resolving bronchitis, hypertension, coronary artery disease  status post coronary artery bypass graft/AVR, non-insulin-dependent diabetes  mellitus controlled by diet, hypercholesterolemia, mild renal insufficiency,  status post nonsustained ventricular tachycardia, asymptomatic.   DISCHARGE HOME MEDICATIONS:  Enteric-coated aspirin 81 mg 2 tabs a day,  Toprol-XL 100 mg daily, __________ , Avapro 300 mg 1 tablet daily, Lipitor  20 mg 1 tablet daily, hydrocodone/APAP 5/500 mg 2 tablets every 8 hours as  needed.   FOLLOWUP:  Follow up with me in 2 weeks, follow up with Dr. Sampson Goon.  Venetia Maxon in 1 week.   CONDITION ON DISCHARGE:  Stable.  __________  hypertension, hypercholesterolemia, diabetes mellitus controlled  by diet, mild renal insufficiency, __________ .  The patient has been seen  by Dr. Franky Macho in the past and __________   PAST MEDICAL HISTORY:  As above.   PAST SURGICAL HISTORY:  As above.   PHYSICAL EXAMINATION:  Blood pressure was 192/97, afebrile.  Neck supple.  No bruits.  Lungs clear to auscultation.  __________   LABORATORY DATA:  MRI of the back showed __________           ______________________________  Eduardo Osier. Sharyn Lull, M.D.     MNH/MEDQ  D:  09/08/2006  T:  09/09/2006  Job:  [id number]

## 2011-03-11 NOTE — Discharge Summary (Signed)
NAME:  [name] [name], [name] [name]                     ACCOUNT NO.:  [id number]   MEDICAL RECORD NO.:  [id number]                   PATIENT TYPE:  INP   LOCATION:  2029                                 FACILITY:  [facility]   PHYSICIAN:  Gwenith Daily. Tyrone Sage, M.D.            DATE OF BIRTH:  1932-12-04   DATE OF ADMISSION:  01/28/2004  DATE OF DISCHARGE:  02/14/2004                                 DISCHARGE SUMMARY   ADMISSION DIAGNOSIS:  Flash pulmonary edema.   ADDITIONAL DIAGNOSES/DISCHARGE DIAGNOSES:  1. Known history of coronary artery disease status post stent placement of     the left anterior descending in 1999.  2. History of uncontrolled hypertension.  3. Hyperlipidemia.  4. Acute on chronic renal insufficiency during this admission.  5. History of myocardial infarction in 1999.  6. History of moderately severe aortic stenosis.  7. History of tobacco abuse.  8. Status post right inguinal hernia repair in 2001.  9. Postoperative supraventricular tachycardia, now on Coumadin therapy.  10.      Status post aortic valve replacement and coronary artery bypass     graft completed on February 06, 2004.  11.      Postoperative anemia.  12.      Postoperative elevated liver function tests, trending towards     normal at the time of discharge.   HOSPITAL PROCEDURES/MANAGEMENT:  1. IV Lasix therapy for flash pulmonary edema and exacerbation of congestive     heart failure.  2. MRI/MRA of the kidneys which showed no evidence of renal artery stenosis.  3. A 2-D echocardiogram completed on January 30, 2004.  4. Arterial evaluation including bilateral carotid duplex, palmar arch test,     and ABIs completed on February 03, 2004.  5. Aortic valve replacement utilizing a 23 mm pericardial bioprosthetic     valve and coronary artery bypass grafting x2, completed on February 06, 2004.  6. Initiation of cardiac rehab phase 1.   CONSULTATIONS:  1. Renal service.  2. Cardiac rehab.  3. Case management.   HISTORY OF PRESENT ILLNESS:  Mr. [name] [name] is a 77 year old black male with a  Past Medical History significant for coronary artery disease and a history  of myocardial infarction status post PTCA and stenting in 1999 as well as  hypertension, moderately severe aortic stenosis, hypercholesterolemia, and  mild renal insufficiency.  The patient presented to the emergency department  via EMS with complaints of sudden onset of shortness of breath associated  with diaphoresis.  At the time of presentation, the patient denied any chest  pain, nausea, vomiting, or palpitations.  At the time of presentation to the  emergency department, the patient's O2 saturations were 86%.  He was given  BiPAP, morphine, nitroglycerin, Lasix, and supplemental oxygen.  With this  management, the patient's symptoms improved.  Serial cardiac enzymes were  drawn and the peak CK was 160 with an MB of 2.9.  Troponin's  were less than  0.5 x3.  The patient denied any syncope or near syncope.  He denied any  previous shortness of breath.  The patient was admitted to [facility] for further management of these issues.   HOSPITAL COURSE:  Mr. [name] [name] was admitted to [facility] on January 28, 2004 with flash pulmonary edema and hypertensive urgency.  He was seen in  consultation by cardiology who decided to complete a MRI/MRA of the kidney  to rule out renal artery stenosis as well as to complete a 2-D  echocardiogram to check the patient's left ventricular function.  The  MRI/MRA was negative for renal artery stenosis.  A 2-D echocardiogram  completed on January 30, 2004 revealed a left ventricular ejection fraction of  50 to 55%.  There were findings suggestive of moderate hypokinesis of the  basal inferoposterior wall.  His regional myocardial contractile function  was otherwise normal.  The aortic valve with mildly to moderately calcified  with moderately reduced aortic valve leaflet excursion.  These  findings were  consistent with severe aortic valve stenosis.  Mean transaortic valve  gradient was 43 mmHg.  An estimated aortic valve area was 0.69 cm squared.  The plan was then made for the patient to undergo a heart catheterization  for further evaluation of known coronary artery disease.   The patient was taken to the cardiac catheterization lab and underwent right  and left heart catheterization with elective left and right coronary  angiography on February 02, 2004.  This showed that the left main was patent.  The LAD had a 30 to 40% sequential stenosis in the proximal, mid, and distal  region.  The first diagonal was moderate-sized with a 20 to 30% proximal  stenosis.  The second and third diagonals were very small and were patent.  The left circumflex was small and was patent.  The right coronary artery had  30 to 40% proximal and mid stenosis.  With these findings, in addition to  known aortic valve stenosis, a CVTS consult was initiated for possible  surgical intervention.   Dr. Tyrone Sage of CVTS responded to the cardiac surgery consultation on February 02, 2004.  His impression was that the patient had concomitant coronary  artery disease and significant aortic stenosis.  He reviewed with the  patient and his wife the diagnosis of critical aortic stenosis and because  of the patient's symptoms he expressed the need for aortic valve replacement  and consideration of bypass surgery.  The risks and options were discussed  with the patient and his family at that time.  They were in understanding  and wished to proceed with surgery.  The patient was seen and evaluated by  the renal service for a history of chronic renal insufficiency and a slight  bump in creatinine after cardiac catheterization.   The patient was seen and examined by the renal service February 03, 2004.  They  made the appropriate recommendations and continued to follow the patient throughout his hospitalization.  The  plan for AVR and CABG combination  surgery were postponed until the patient's creatinine stabilized.  In the  meantime, the patient underwent arterial evaluation with bilateral carotid  duplex exam which showed no evidence of internal carotid artery stenosis  bilaterally.  Palmar arch tests were completed bilaterally and were within  normal limits.  ABIs were also completed and showed a right ABI of 0.84 and  a left ABI of 1.  Overall,  the patient remained stable over the next several  hospital days and received appropriate treatment for elevated creatinine.   Once the patient's renal status had stabilized, he was taken to the  operating room February 06, 2004.  At that time, he underwent aortic valve  replacement with a 23 mm pericardial bioprosthetic valve and coronary artery  bypass grafting x2 utilizing the saphenous vein graft to the right coronary  artery and the left internal mammary artery to the left anterior descending  artery.  The greater saphenous vein was endoscopically harvested from the  right thigh.  Transesophageal echocardiogram was completed intraoperatively  and showed improvement in aortic valve gradient status post replacement.  Overall, the patient tolerated the procedure well and was transferred to the  surgical intensive care unit in critical but stable condition.   Postoperatively, the patient was treated appropriately for postoperative  anemia, acute on chronic renal insufficiency, hypertension, and significant  pulmonary edema.  He continued to make slow but steady progress.  The  patient did have several episodes of supraventricular tachycardia/atrial  fibrillation postoperatively.  Was treated with amiodarone therapy and the  patient was ultimately started on Coumadin for paroxysmal atrial  fibrillation.   The patient made slow and steady progress and ultimately was deemed  appropriate for discharge on February 14, 2004.  The patient had been feeling  quite  well.  He denied any chest pain, shortness of breath, or nausea and  vomiting.  He was ambulating without difficulty with cardiac rehab as well  as independently.  He had remained afebrile and his vital signs were stable  with a blood pressure of 145/88, pulse of 82 and regular, respirations of 20  and unlabored, SpO2 was 95% on room air.  The patient's weight was at 216  pounds with a preoperative weight of 209 to 215 pounds.  Heart was in a  regular rate and rhythm with a normal sinus rhythm on telemetry.  His lungs  revealed coarse rhonchi that were cleared with a cough.  His abdomen was  benign.  His extremities had 1+ ankle edema bilaterally.  The patient's  sternum was stable and clean, dry, and intact.  His right lower extremity  was healing well from saphenous vein harvest.  The patient had resumed  normal bowel and bladder function.  He was tolerating a regular diet.  He  had a slight increase in LFTs several days prior to discharge and this was trending towards normal at the time of discharge.  The patient's BUN and  creatinine were at baseline reading 22 and 2 at the time of discharge.  PT/INR were at 16.3 and 1.5 respectively.   DISPOSITION:  The patient is being discharged to home in improved and stable  condition on February 14, 2004.   DISCHARGE MEDICATIONS:  1. Toprol-XL 25 mg daily.  2. Lipitor 40 mg daily.  3. Amiodarone 200 mg daily.  4. Lasix 40 mg daily for 7 days.  5. K-Dur 10 mEq daily for 7 days.  6. Coumadin 7.5 mg daily and then as directed by Dr. Sharyn Lull.  7. Tylox one to two tabs every 4-6 hours as needed for pain.   DISCHARGE INSTRUCTIONS:  1. Activity:  The patient should avoid driving.  He should avoid heavy     lifting or strenuous activity.  He should continue to walk daily.  He     should continue his breathing exercises for one more week.  2. Diet:  The patient should follow  a low fat, low salt, carbohydrate     modified medium calorie diet.  3.  Wound Care:  The patient may shower.  He should wash his incisions daily     with soap and water.  He should notify the CVTS office if he has any     redness, swelling, or drainage from his incisions.   SPECIAL INSTRUCTIONS:  The patient is to have a PT/INR blood work drawn on  February 17, 2004 at home by the home health nurse and this information is to  be forwarded to Dr. Annitta Jersey office.   FOLLOW UP APPOINTMENTS:  1. The patient is to see Dr. Sharyn Lull in 2 weeks from discharge.  He is to     call this office to arrange for appointment date and time.  2. The patient is to see Dr. Tyrone Sage Mar 04, 2004 at 12 noon, with a chest     x-ray completed at [facility] at 11 a.m.  3. The patient is to follow up with [facility] within 2 to 3 weeks of     discharge.  He is to call and arrange this appointment date and time.      Carolyn A. Eustaquio Boyden.                  Gwenith Daily Tyrone Sage, M.D.    CAF/MEDQ  D:  03/17/2004  T:  03/19/2004  Job:  [id number]   cc:   Eduardo Osier. Sharyn Lull, M.D.  [address]  [place]  [place] [address]  Fax: [phone]   Terrial Rhodes, M.D.  [address]  [facility]  [place] [address]  Fax: [phone]   [facility]

## 2011-04-01 ENCOUNTER — Ambulatory Visit
Admission: RE | Admit: 2011-04-01 | Discharge: 2011-04-01 | Disposition: A | Discharge status: Home / Self Care | Payer: Medicare Other | Source: Ambulatory Visit | Attending: Nephrology | Admitting: Nephrology

## 2011-04-01 ENCOUNTER — Other Ambulatory Visit: Payer: Self-pay | Admitting: Nephrology

## 2011-04-01 DIAGNOSIS — R05 Cough: Secondary | ICD-10-CM

## 2011-10-24 ENCOUNTER — Encounter: Payer: Self-pay | Admitting: Emergency Medicine

## 2011-10-24 ENCOUNTER — Emergency Department (HOSPITAL_COMMUNITY)
Admission: EM | Admit: 2011-10-24 | Discharge: 2011-10-24 | Disposition: A | Discharge status: Home / Self Care | Payer: Medicare Other | Attending: Emergency Medicine | Admitting: Emergency Medicine

## 2011-10-24 DIAGNOSIS — L0100 Impetigo, unspecified: Secondary | ICD-10-CM | POA: Insufficient documentation

## 2011-10-24 DIAGNOSIS — I10 Essential (primary) hypertension: Secondary | ICD-10-CM | POA: Insufficient documentation

## 2011-10-24 DIAGNOSIS — L299 Pruritus, unspecified: Secondary | ICD-10-CM | POA: Insufficient documentation

## 2011-10-24 HISTORY — DX: Essential (primary) hypertension: I10

## 2011-10-24 MED ORDER — CEPHALEXIN 500 MG PO CAPS: 500.0000 mg | ORAL_CAPSULE | Freq: Three times a day (TID) | ORAL | Status: AC

## 2011-10-24 MED ORDER — CEPHALEXIN 250 MG PO CAPS
500.0000 mg | ORAL_CAPSULE | Freq: Once | ORAL | Status: AC
  Administered 2011-10-24: 500 mg via ORAL
  Filled 2011-10-24: qty 2

## 2011-10-24 NOTE — ED Notes (Signed)
Pt stated around 0130 last night he woke up and his pillow was wet.  Stated his lower part of his face was itching and swelling.

## 2011-10-24 NOTE — ED Provider Notes (Signed)
History     CSN: [id number]  Arrival date & time 10/24/11  4696   First MD Initiated Contact with Patient 10/24/11 [phone]      Chief Complaint  Patient presents with  . Rash    (Consider location/radiation/quality/duration/timing/severity/associated sxs/prior treatment) HPI Comments: Patient with weeping areas on bilateral chin for the last 5 hours. Patient states the area started to itch and he applied water mixed with Epson salt which helped. The patient awoke early this morning with drainage on his pillow and he came to the emergency department for evaluation. Patient denies fever, mouth pain, upper respiratory symptoms, vomiting. No trouble breathing, mouth swelling, tongue swelling. No new medications. No new exposures noted.  Patient is a 77 y.o. male presenting with rash. The history is provided by the patient.  Rash  This is a new problem. The current episode started 3 to 5 hours ago. The problem is associated with nothing. There has been no fever. The rash is present on the face. He has tried a cold compress for the symptoms.    Past Medical History  Diagnosis Date  . Hypertension     Past Surgical History  Procedure Date  . Cardiac surgery     History reviewed. No pertinent family history.  History  Substance Use Topics  . Smoking status: Not on file  . Smokeless tobacco: Not on file  . Alcohol Use: No      Review of Systems  Constitutional: Negative for fever and chills.  HENT: Negative for congestion, sore throat and rhinorrhea.   Eyes: Negative for redness.  Respiratory: Negative for shortness of breath.   Skin: Positive for rash. Negative for wound.  Neurological: Negative for headaches.    Allergies  Review of patient's allergies indicates no known allergies.  Home Medications   Current Outpatient Rx  Name Route Sig Dispense Refill  . ASPIRIN EC 81 MG PO TBEC Oral Take 162 mg by mouth daily.      . CRESTOR PO Oral Take 1 tablet by mouth.          BP 171/102  Pulse 86  Temp(Src) 97.9 F (36.6 C) (Oral)  Resp 23  SpO2 100%  Physical Exam  Nursing note and vitals reviewed. Constitutional: He is oriented to person, place, and time. He appears well-developed and well-nourished.  HENT:  Head: Normocephalic and atraumatic.  Right Ear: External ear normal.  Left Ear: External ear normal.  Mouth/Throat: Oropharynx is clear and moist.  Eyes: Conjunctivae are normal. Pupils are equal, round, and reactive to light. Right eye exhibits no discharge. Left eye exhibits no discharge.  Neck: Normal range of motion. Neck supple.  Cardiovascular: Normal rate and regular rhythm.        Healed surgical scars on chest.  Pulmonary/Chest: Effort normal and breath sounds normal.  Abdominal: Soft.  Musculoskeletal: He exhibits no edema.  Lymphadenopathy:    He has no cervical adenopathy.  Neurological: He is alert and oriented to person, place, and time.  Skin: Skin is warm and dry.       Patient with honey crusted, mildly weeping areas on the bilateral chin. Area is nontender. There does not appear to be surrounding erythema.  Psychiatric: He has a normal mood and affect.    ED Course  Procedures (including critical care time)  Labs Reviewed - No data to display No results found.   1. Impetigo   2. Hypertension     Patient seen and examined. Seen by Dr. Bebe Shaggy.  Will give antibiotics for presumed impetigo. No systemic symptoms of illness. Patient counseled on wound care.  MDM  Patient treated for impetigo. Oral antibiotics given.        Eustace Moore [place], [place] 10/24/11 [phone]

## 2011-10-24 NOTE — ED Provider Notes (Signed)
Pt well appearing, but rash noted to face No new meds No oral/conjunctival lesions ?impetigo  Joya Gaskins, MD 10/24/11 [phone]

## 2011-10-25 NOTE — ED Provider Notes (Signed)
Medical screening examination/treatment/procedure(s) were conducted as a shared visit with non-physician practitioner(s) and myself.  I personally evaluated the patient during the encounter   Joya Gaskins, MD 10/25/11 [phone]

## 2012-03-14 ENCOUNTER — Ambulatory Visit
Admission: RE | Admit: 2012-03-14 | Discharge: 2012-03-14 | Disposition: A | Discharge status: Home / Self Care | Payer: Medicare Other | Source: Ambulatory Visit | Attending: Nephrology | Admitting: Nephrology

## 2012-03-14 ENCOUNTER — Other Ambulatory Visit: Payer: Self-pay | Admitting: Nephrology

## 2012-03-14 DIAGNOSIS — R05 Cough: Secondary | ICD-10-CM

## 2012-03-14 DIAGNOSIS — R062 Wheezing: Secondary | ICD-10-CM

## 2012-03-30 ENCOUNTER — Inpatient Hospital Stay (HOSPITAL_COMMUNITY)
Admission: EM | Admit: 2012-03-30 | Discharge: 2012-04-05 | DRG: 292 | Disposition: A | Discharge status: Home Health | Payer: Medicare Other | Attending: Cardiology | Admitting: Cardiology

## 2012-03-30 ENCOUNTER — Encounter (HOSPITAL_COMMUNITY): Payer: Self-pay | Admitting: *Deleted

## 2012-03-30 ENCOUNTER — Emergency Department (HOSPITAL_COMMUNITY): Payer: Medicare Other

## 2012-03-30 DIAGNOSIS — Z954 Presence of other heart-valve replacement: Secondary | ICD-10-CM

## 2012-03-30 DIAGNOSIS — E7800 Pure hypercholesterolemia, unspecified: Secondary | ICD-10-CM | POA: Diagnosis present

## 2012-03-30 DIAGNOSIS — N184 Chronic kidney disease, stage 4 (severe): Secondary | ICD-10-CM | POA: Diagnosis present

## 2012-03-30 DIAGNOSIS — Z9861 Coronary angioplasty status: Secondary | ICD-10-CM

## 2012-03-30 DIAGNOSIS — I509 Heart failure, unspecified: Secondary | ICD-10-CM | POA: Diagnosis present

## 2012-03-30 DIAGNOSIS — Z951 Presence of aortocoronary bypass graft: Secondary | ICD-10-CM

## 2012-03-30 DIAGNOSIS — J40 Bronchitis, not specified as acute or chronic: Secondary | ICD-10-CM | POA: Diagnosis present

## 2012-03-30 DIAGNOSIS — R42 Dizziness and giddiness: Secondary | ICD-10-CM

## 2012-03-30 DIAGNOSIS — E119 Type 2 diabetes mellitus without complications: Secondary | ICD-10-CM | POA: Diagnosis present

## 2012-03-30 DIAGNOSIS — I2589 Other forms of chronic ischemic heart disease: Secondary | ICD-10-CM | POA: Diagnosis present

## 2012-03-30 DIAGNOSIS — I2510 Atherosclerotic heart disease of native coronary artery without angina pectoris: Secondary | ICD-10-CM | POA: Diagnosis present

## 2012-03-30 DIAGNOSIS — I129 Hypertensive chronic kidney disease with stage 1 through stage 4 chronic kidney disease, or unspecified chronic kidney disease: Secondary | ICD-10-CM | POA: Diagnosis present

## 2012-03-30 DIAGNOSIS — I5043 Acute on chronic combined systolic (congestive) and diastolic (congestive) heart failure: Principal | ICD-10-CM | POA: Diagnosis present

## 2012-03-30 DIAGNOSIS — I252 Old myocardial infarction: Secondary | ICD-10-CM

## 2012-03-30 HISTORY — DX: Heart failure, unspecified: I50.9

## 2012-03-30 HISTORY — DX: Disorder of kidney and ureter, unspecified: N28.9

## 2012-03-30 LAB — URINE MICROSCOPIC-ADD ON

## 2012-03-30 LAB — PROTIME-INR
INR: 1.25 (ref 0.00–1.49)
INR: 1.18 (ref 0.00–1.49)
Prothrombin Time: 16 seconds — ABNORMAL HIGH (ref 11.6–15.2)
Prothrombin Time: 15.2 seconds (ref 11.6–15.2)

## 2012-03-30 LAB — BASIC METABOLIC PANEL
BUN: 37 mg/dL — ABNORMAL HIGH (ref 6–23)
Chloride: 109 mEq/L (ref 96–112)
Creatinine, Ser: 2.6 mg/dL — ABNORMAL HIGH (ref 0.50–1.35)
GFR calc Af Amer: 26 mL/min — ABNORMAL LOW (ref >90)
GFR calc non Af Amer: 22 mL/min — ABNORMAL LOW (ref >90)
Glucose, Bld: 119 mg/dL — ABNORMAL HIGH (ref 70–99)

## 2012-03-30 LAB — DIFFERENTIAL
Basophils Absolute: 0 10*3/uL (ref 0.0–0.1)
Basophils Relative: 0 % (ref 0–1)
Eosinophils Absolute: 0.1 10*3/uL (ref 0.0–0.7)
Lymphs Abs: 1.5 10*3/uL (ref 0.7–4.0)
Neutro Abs: 6.2 10*3/uL (ref 1.7–7.7)

## 2012-03-30 LAB — URINALYSIS, ROUTINE W REFLEX MICROSCOPIC
Bilirubin Urine: NEGATIVE
Hgb urine dipstick: NEGATIVE
Ketones, ur: NEGATIVE mg/dL
Nitrite: NEGATIVE
pH: 5.5 (ref 5.0–8.0)

## 2012-03-30 LAB — APTT
aPTT: >200 seconds (ref 24–37)
aPTT: 150 seconds — ABNORMAL HIGH (ref 24–37)

## 2012-03-30 LAB — CBC
HCT: 37.6 % — ABNORMAL LOW (ref 39.0–52.0)
MCHC: 32.4 g/dL (ref 30.0–36.0)
MCV: 88.5 fL (ref 78.0–100.0)
RDW: 17.5 % — ABNORMAL HIGH (ref 11.5–15.5)

## 2012-03-30 LAB — CARDIAC PANEL(CRET KIN+CKTOT+MB+TROPI)
CK, MB: 3.3 ng/mL (ref 0.3–4.0)
Total CK: 103 U/L (ref 7–232)

## 2012-03-30 LAB — GLUCOSE, CAPILLARY: Glucose-Capillary: 112 mg/dL — ABNORMAL HIGH (ref 70–99)

## 2012-03-30 LAB — TROPONIN I: Troponin I: <0.3 ng/mL (ref <0.30)

## 2012-03-30 MED ORDER — CARVEDILOL 3.125 MG PO TABS
3.1250 mg | ORAL_TABLET | Freq: Two times a day (BID) | ORAL | Status: DC
  Administered 2012-03-30 – 2012-04-05 (×12): 3.125 mg via ORAL
  Filled 2012-03-30 (×13): qty 1

## 2012-03-30 MED ORDER — HEPARIN BOLUS VIA INFUSION
4000.0000 [IU] | Freq: Once | INTRAVENOUS | Status: AC
  Administered 2012-03-30: 4000 [IU] via INTRAVENOUS

## 2012-03-30 MED ORDER — FUROSEMIDE 10 MG/ML IJ SOLN
40.0000 mg | Freq: Once | INTRAMUSCULAR | Status: AC
  Administered 2012-03-30: 40 mg via INTRAVENOUS
  Filled 2012-03-30: qty 4

## 2012-03-30 MED ORDER — ONDANSETRON HCL 4 MG/2ML IJ SOLN: 4.0000 mg | Freq: Four times a day (QID) | INTRAMUSCULAR | Status: DC | PRN

## 2012-03-30 MED ORDER — TECHNETIUM TO 99M ALBUMIN AGGREGATED
3.3000 | Freq: Once | INTRAVENOUS | Status: AC | PRN
  Administered 2012-03-30: 3 via INTRAVENOUS

## 2012-03-30 MED ORDER — FUROSEMIDE 10 MG/ML IJ SOLN
40.0000 mg | Freq: Two times a day (BID) | INTRAMUSCULAR | Status: DC
  Administered 2012-03-30 – 2012-04-05 (×12): 40 mg via INTRAVENOUS
  Filled 2012-03-30 (×15): qty 4

## 2012-03-30 MED ORDER — INSULIN ASPART 100 UNIT/ML ~~LOC~~ SOLN
0.0000 [IU] | Freq: Three times a day (TID) | SUBCUTANEOUS | Status: DC
  Administered 2012-03-31 – 2012-04-05 (×3): 1 [IU] via SUBCUTANEOUS

## 2012-03-30 MED ORDER — ASPIRIN EC 81 MG PO TBEC
81.0000 mg | DELAYED_RELEASE_TABLET | Freq: Every day | ORAL | Status: DC
  Administered 2012-03-30 – 2012-04-05 (×6): 81 mg via ORAL
  Filled 2012-03-30 (×7): qty 1

## 2012-03-30 MED ORDER — SODIUM CHLORIDE 0.9 % IV SOLN: 250.0000 mL | INTRAVENOUS | Status: DC | PRN

## 2012-03-30 MED ORDER — DEXTROSE 5 % IV SOLN
1.0000 g | INTRAVENOUS | Status: DC
  Administered 2012-03-30 – 2012-04-04 (×6): 1 g via INTRAVENOUS
  Filled 2012-03-30 (×7): qty 10

## 2012-03-30 MED ORDER — INSULIN ASPART 100 UNIT/ML ~~LOC~~ SOLN: 0.0000 [IU] | Freq: Every day | SUBCUTANEOUS | Status: DC

## 2012-03-30 MED ORDER — PANTOPRAZOLE SODIUM 40 MG PO TBEC
40.0000 mg | DELAYED_RELEASE_TABLET | Freq: Every day | ORAL | Status: DC
  Administered 2012-03-31 – 2012-04-05 (×5): 40 mg via ORAL
  Filled 2012-03-30 (×7): qty 1

## 2012-03-30 MED ORDER — SODIUM CHLORIDE 0.9 % IJ SOLN: 3.0000 mL | INTRAMUSCULAR | Status: DC | PRN

## 2012-03-30 MED ORDER — LEVALBUTEROL HCL 1.25 MG/0.5ML IN NEBU
1.2500 mg | INHALATION_SOLUTION | Freq: Three times a day (TID) | RESPIRATORY_TRACT | Status: DC
  Administered 2012-03-30: 1.25 mg via RESPIRATORY_TRACT
  Filled 2012-03-30 (×6): qty 0.5

## 2012-03-30 MED ORDER — SODIUM CHLORIDE 0.9 % IV SOLN
INTRAVENOUS | Status: AC
  Administered 2012-03-30: 10 mL/h via INTRAVENOUS

## 2012-03-30 MED ORDER — SODIUM CHLORIDE 0.9 % IJ SOLN
3.0000 mL | Freq: Two times a day (BID) | INTRAMUSCULAR | Status: DC
  Administered 2012-03-30 – 2012-04-05 (×12): 3 mL via INTRAVENOUS

## 2012-03-30 MED ORDER — POTASSIUM CHLORIDE CRYS ER 20 MEQ PO TBCR
40.0000 meq | EXTENDED_RELEASE_TABLET | Freq: Once | ORAL | Status: AC
  Administered 2012-03-30: 40 meq via ORAL
  Filled 2012-03-30: qty 2

## 2012-03-30 MED ORDER — HEPARIN SODIUM (PORCINE) 5000 UNIT/ML IJ SOLN
5000.0000 [IU] | Freq: Three times a day (TID) | INTRAMUSCULAR | Status: DC
  Administered 2012-03-30 – 2012-04-05 (×17): 5000 [IU] via SUBCUTANEOUS
  Filled 2012-03-30 (×20): qty 1

## 2012-03-30 MED ORDER — HEPARIN (PORCINE) IN NACL 100-0.45 UNIT/ML-% IJ SOLN
1400.0000 [IU]/h | INTRAMUSCULAR | Status: DC
  Administered 2012-03-30: 1400 [IU]/h via INTRAVENOUS
  Filled 2012-03-30: qty 250

## 2012-03-30 MED ORDER — POTASSIUM CHLORIDE CRYS ER 20 MEQ PO TBCR
20.0000 meq | EXTENDED_RELEASE_TABLET | Freq: Every day | ORAL | Status: DC
  Administered 2012-03-30 – 2012-04-01 (×3): 20 meq via ORAL
  Filled 2012-03-30 (×3): qty 1

## 2012-03-30 MED ORDER — NITROGLYCERIN 2 % TD OINT
0.5000 [in_us] | TOPICAL_OINTMENT | Freq: Four times a day (QID) | TRANSDERMAL | Status: DC
  Administered 2012-03-30 – 2012-04-05 (×22): 0.5 [in_us] via TOPICAL
  Filled 2012-03-30: qty 30

## 2012-03-30 MED ORDER — ACETAMINOPHEN 325 MG PO TABS: 650.0000 mg | ORAL_TABLET | ORAL | Status: DC | PRN

## 2012-03-30 NOTE — Progress Notes (Signed)
Critical lab value called by [facility] in lab; PTT >200.  Pharmacy notified and they are assessing result due to timing of lab draw.  [place],RN

## 2012-03-30 NOTE — Progress Notes (Signed)
ANTICOAGULATION CONSULT NOTE - Initial Consult  Pharmacy Consult for Heparin Indication: pulmonary embolus  No Known Allergies  Patient Measurements:   RN reports pt is 68in and 88kg  Vital Signs: Temp: 97.6 F (36.4 C) (06/07 1131) Temp src: Oral (06/07 1131) BP: 153/80 mmHg (06/07 1232) Pulse Rate: 67  (06/07 1232)  Labs:  Basename 03/30/12 1212  HGB 12.2*  HCT 37.6*  PLT 139*  APTT --  LABPROT --  INR --  HEPARINUNFRC --  CREATININE 2.60*  CKTOTAL --  CKMB --  TROPONINI <0.30    CrCl is unknown because there is no height on file for the current visit.   Medical History: Past Medical History  Diagnosis Date  . Hypertension   . Renal disorder     renal failure  . CHF (congestive heart failure)     Medications:  Prior to Admission: Furosemide, Bidil, ambien Scheduled:    . furosemide  40 mg Intravenous Once   Infusions:    Assessment:  78 yo M with heparin per pharmacy ordered for PE  CBC appears adequate, but platelets at baseline are only 139  Renal function is reduced with SCr 2.6  D-dimer 3.06  Baseline coags are ordered and pending  Goal of Therapy:  Heparin level 0.3-0.7 units/ml Monitor platelets by anticoagulation protocol: Yes   Plan:   Follow up baseline coags  Heparin bolus 4000 units IV x1  Heparin IV infusion 1400 units/hr (14 ml/hr)  Daily heparin level and CBC  Heparin level in 8 hours  Lynann Beaver PharmD, BCPS Pager [phone] 03/30/2012 3:31 PM

## 2012-03-30 NOTE — ED Notes (Signed)
Pt said too dizzy too stand for the finale orthostatic monitoring

## 2012-03-30 NOTE — ED Notes (Signed)
Per EMS pt drove to doctor's office for lab work, has been fasting for lab work, getting dressed this morning and felt dizzy before going to doctors, didn't make it inside to doctors, sister called 911, didn't feel like he was going to pass out, just felt dizzy, has happen before but this dizzy spell lasted longer than normal. No longer feels dizzy. BP 122/78, CBG 98

## 2012-03-30 NOTE — ED Notes (Signed)
Pt. Was given a ham sandwich, apple juice, crackers and peanut butter.

## 2012-03-30 NOTE — ED Notes (Signed)
Report called to floor.  Nurse unable to take report at this time.

## 2012-03-30 NOTE — H&P (Signed)
[name] [name] is an 78 y.o. male.   Chief Complaint: Dizziness generalized weakness/shortness of breath HPI: Patient is 78 year old male with past medical history significant for multiple medical problems i.e. coronary artery disease history of injury wall MI in the past status post PTCA stenting in July of 1999, subsequently had CABG and aVR in 2005, hypertension, history of aortic stenosis, non-insulin-dependent diabetes matters controlled by diet, chronic progressive kidney disease, hypercholesteremia, history of nonsustained VT and SVT in the past, patient came to the ER from the lab as he had an episode of dizziness associated with generalized feeling weak and shortness of breath. Patient denies any chest pain nausea vomiting or diaphoresis. Patient complains of progressive  increasing leg swelling and dyspnea with minimal exertion and was seen in my office  few days ago was noted to be in mild heart failure and was treated with increasing Lasix dose with mild improvement. Patient denies any palpitation or syncopal episode. Patient does give history of PND orthopnea and leg swelling. Patient also comp complains of cough with whitish milky mucous but denies any fever or chills  Past Medical History  Diagnosis Date  . Hypertension   . Renal disorder     renal failure  . CHF (congestive heart failure)     Past Surgical History  Procedure Date  . Cardiac surgery   . Cardiac valve surgery   . Femoral artery stent     right leg per pt    No family history on file. Social History:  reports that he has been smoking.  He has never used smokeless tobacco. He reports that he does not drink alcohol or use illicit drugs.  Allergies: No Known Allergies  Medications Prior to Admission  Medication Sig Dispense Refill  . furosemide (LASIX) 40 MG tablet Take 40 mg by mouth 2 (two) times daily.      . isosorbide-hydrALAZINE (BIDIL) 20-37.5 MG per tablet Take 1 tablet by mouth 3 (three) times  daily.      Marland Kitchen zolpidem (AMBIEN) 5 MG tablet Take 5 mg by mouth at bedtime as needed. For sleep.        Results for orders placed during the hospital encounter of 03/30/12 (from the past 48 hour(s))  URINALYSIS, ROUTINE W REFLEX MICROSCOPIC     Status: Abnormal   Collection Time   03/30/12 11:58 AM      Component Value Range Comment   Color, Urine YELLOW  YELLOW     APPearance CLEAR  CLEAR     Specific Gravity, Urine 1.015  1.005 - 1.030     pH 5.5  5.0 - 8.0     Glucose, UA NEGATIVE  NEGATIVE (mg/dL)    Hgb urine dipstick NEGATIVE  NEGATIVE     Bilirubin Urine NEGATIVE  NEGATIVE     Ketones, ur NEGATIVE  NEGATIVE (mg/dL)    Protein, ur 30 (*) NEGATIVE (mg/dL)    Urobilinogen, UA 0.2  0.0 - 1.0 (mg/dL)    Nitrite NEGATIVE  NEGATIVE     Leukocytes, UA NEGATIVE  NEGATIVE    URINE MICROSCOPIC-ADD ON     Status: Normal   Collection Time   03/30/12 11:58 AM      Component Value Range Comment   Squamous Epithelial / LPF RARE  RARE     WBC, UA 3-6  <3 (WBC/hpf)    RBC / HPF 0-2  <3 (RBC/hpf)    Bacteria, UA RARE  RARE    CBC  Status: Abnormal   Collection Time   03/30/12 12:12 PM      Component Value Range Comment   WBC 8.4  4.0 - 10.5 (K/uL)    RBC 4.25  4.22 - 5.81 (MIL/uL)    Hemoglobin 12.2 (*) 13.0 - 17.0 (g/dL)    HCT 96.0 (*) 45.4 - 52.0 (%)    MCV 88.5  78.0 - 100.0 (fL)    MCH 28.7  26.0 - 34.0 (pg)    MCHC 32.4  30.0 - 36.0 (g/dL)    RDW 09.8 (*) 11.9 - 15.5 (%)    Platelets 139 (*) 150 - 400 (K/uL)   DIFFERENTIAL     Status: Normal   Collection Time   03/30/12 12:12 PM      Component Value Range Comment   Neutrophils Relative 74  43 - 77 (%)    Lymphocytes Relative 18  12 - 46 (%)    Monocytes Relative 7  3 - 12 (%)    Eosinophils Relative 1  0 - 5 (%)    Basophils Relative 0  0 - 1 (%)    Neutro Abs 6.2  1.7 - 7.7 (K/uL)    Lymphs Abs 1.5  0.7 - 4.0 (K/uL)    Monocytes Absolute 0.6  0.1 - 1.0 (K/uL)    Eosinophils Absolute 0.1  0.0 - 0.7 (K/uL)    Basophils  Absolute 0.0  0.0 - 0.1 (K/uL)    Smear Review PLATELET COUNT CONFIRMED BY SMEAR     BASIC METABOLIC PANEL     Status: Abnormal   Collection Time   03/30/12 12:12 PM      Component Value Range Comment   Sodium 143  135 - 145 (mEq/L)    Potassium 3.9  3.5 - 5.1 (mEq/L)    Chloride 109  96 - 112 (mEq/L)    CO2 20  19 - 32 (mEq/L)    Glucose, Bld 119 (*) 70 - 99 (mg/dL)    BUN 37 (*) 6 - 23 (mg/dL)    Creatinine, Ser 1.47 (*) 0.50 - 1.35 (mg/dL)    Calcium 9.0  8.4 - 10.5 (mg/dL)    GFR calc non Af Amer 22 (*) >90 (mL/min)    GFR calc Af Amer 26 (*) >90 (mL/min)   TROPONIN I     Status: Normal   Collection Time   03/30/12 12:12 PM      Component Value Range Comment   Troponin I <0.30  <0.30 (ng/mL)   PRO B NATRIURETIC PEPTIDE     Status: Abnormal   Collection Time   03/30/12 12:12 PM      Component Value Range Comment   Pro B Natriuretic peptide (BNP) 2752.0 (*) 0 - 450 (pg/mL)   D-DIMER, QUANTITATIVE     Status: Abnormal   Collection Time   03/30/12 12:12 PM      Component Value Range Comment   D-Dimer, Quant 3.06 (*) 0.00 - 0.48 (ug/mL-FEU)   APTT     Status: Abnormal   Collection Time   03/30/12  4:20 PM      Component Value Range Comment   aPTT >200 (*) 24 - 37 (seconds)   PROTIME-INR     Status: Abnormal   Collection Time   03/30/12  4:20 PM      Component Value Range Comment   Prothrombin Time 16.0 (*) 11.6 - 15.2 (seconds)    INR 1.25  0.00 - 1.49     Nm Pulmonary Perfusion  03/30/2012  *RADIOLOGY REPORT*  Clinical Data: Shortness of breath.  Elevated D-dimer  NM PULMONARY PERFUSION PARTICULATE  Radiopharmaceutical: CURIE MAA TECHNETIUM TO 46M ALBUMIN AGGREGATED  Comparison: 03/30/2012  Findings: On the chest radiograph there is evidence of cardiac enlargement and pulmonary vascular congestion.  On the perfusion portion of the examination there is relative, nonsegmental decreased radiotracer activity to the left upper lobe. There is also relative nonsegmental decreased  activity in the left lower lobe which is likely due to attenuation artifact from the patient's enlarged cardiac heart.  No medium or large size segmental perfusion defects are identified.  IMPRESSION:  1.  Low probability for acute pulmonary embolus.  Original Report Authenticated By: Rosealee Albee, M.D.   Dg Chest Port 1v Same Day  03/30/2012  *RADIOLOGY REPORT*  Clinical Data: Dizziness, history of congestive heart failure  PORTABLE CHEST - 1 VIEW SAME DAY  Comparison: Chest x-ray of 03/14/2012 and 04/01/2011  Findings: There is moderate cardiomegaly present with pulmonary vascular congestion.  A small left effusion cannot be excluded. Median sternotomy sutures are noted from prior CABG.  IMPRESSION: Cardiomegaly and moderate pulmonary vascular congestion.  Original Report Authenticated By: Juline Patch, M.D.    Review of Systems  Constitutional: Negative for fever and chills.  Respiratory: Positive for cough, sputum production and shortness of breath. Negative for hemoptysis and wheezing.   Cardiovascular: Positive for orthopnea, leg swelling and PND. Negative for chest pain and palpitations.  Gastrointestinal: Negative for nausea, vomiting and abdominal pain.  Musculoskeletal: Negative for myalgias.  Neurological: Positive for dizziness and weakness.    Blood pressure 189/92, pulse 73, temperature 97.5 F (36.4 C), temperature source Oral, resp. rate 22, height 5\' 7"  (1.702 m), weight 84.7 kg (186 lb 11.7 oz), SpO2 100.00%. Physical Exam  Constitutional: He is oriented to person, place, and time.  HENT:  Head: Normocephalic and atraumatic.  Eyes: Conjunctivae are normal. Left eye exhibits no discharge. No scleral icterus.  Neck: Normal range of motion. Neck supple. JVD present.  Cardiovascular: Normal rate and regular rhythm.        2/6 systolic murmur and soft diastolic murmur  Noted Soft S3 gallop noted  Respiratory:       Decreased breath sound at bases with bilateral rhonchi and  rales noted  GI: Soft. Bowel sounds are normal. He exhibits no distension. There is no tenderness.  Musculoskeletal:       No clubbing cyanosis 2+ edema noted  Lymphadenopathy:    He has no cervical adenopathy.  Neurological: He is alert and oriented to person, place, and time.      Assessment/Plan Decompensated congestive systolic heart failureCoronary artery disease history of MI in the past rule out ischemia rule out MI History of aortic stenosis Status post CABG and aVR Hypertension Non-insulin-dependent diabetes mellitus controlled by diet Chronic kidney disease stage IV Hypercholesteremia History of nonsustained VT and SVT in the past Bronchitis Plan As per orders Dr. Algie Coffer on-call for weekend Will schedule for nuclear stress test been fully compensated and MI ruled out  [facility] 03/30/2012, 6:47 PM

## 2012-03-30 NOTE — ED Notes (Signed)
Pt said he is to dizzy for the finale orthostatic monitoring

## 2012-03-30 NOTE — Progress Notes (Signed)
Pt had a 7 beat run of Vtach. Thurnell Lose , MD notified new orders placed. Will continue to monitor patient. Roosvelt Maser, RN

## 2012-03-30 NOTE — ED Provider Notes (Signed)
History     CSN: [id number]  Arrival date & time 03/30/12  1030   First MD Initiated Contact with Patient 03/30/12 1037      Chief Complaint  Patient presents with  . Dizziness    (Consider location/radiation/quality/duration/timing/severity/associated sxs/prior treatment) HPI Comments: [name] [name] is a 78 y.o. Male who was on his way to a laboratory to get a blood specimen was ordered by his doctor on 03/26/2012. He began to feel dizzy, a lightheaded feeling, so he came here. He does not have chest pain, shortness of breath, nausea, vomiting, or back pain. He has not had a similar problem recently. The symptom is worse with standing. It does not get better when he sits. He has been using his medication as prescribed.  The history is provided by the patient.    Past Medical History  Diagnosis Date  . Hypertension   . Renal disorder     renal failure  . CHF (congestive heart failure)     Past Surgical History  Procedure Date  . Cardiac surgery   . Cardiac valve surgery     No family history on file.  History  Substance Use Topics  . Smoking status: Current Some Day Smoker  . Smokeless tobacco: Never Used  . Alcohol Use: No      Review of Systems  All other systems reviewed and are negative.    Allergies  Review of patient's allergies indicates no known allergies.  Home Medications   Current Outpatient Rx  Name Route Sig Dispense Refill  . FUROSEMIDE 40 MG PO TABS Oral Take 40 mg by mouth 2 (two) times daily.    . ISOSORB DINITRATE-HYDRALAZINE 20-37.5 MG PO TABS Oral Take 1 tablet by mouth 3 (three) times daily.    Marland Kitchen ZOLPIDEM TARTRATE 5 MG PO TABS Oral Take 5 mg by mouth at bedtime as needed. For sleep.      BP 133/71  Pulse 71  Temp(Src) 97.6 F (36.4 C) (Oral)  Resp 16  SpO2 99%  Physical Exam  Nursing note and vitals reviewed. Constitutional: He is oriented to person, place, and time. He appears well-developed and well-nourished.  HENT:    Head: Normocephalic and atraumatic.  Right Ear: External ear normal.  Left Ear: External ear normal.  Eyes: Conjunctivae and EOM are normal. Pupils are equal, round, and reactive to light.  Neck: Normal range of motion and phonation normal. Neck supple.  Cardiovascular: Normal rate, regular rhythm and intact distal pulses.        3/6 systolic murmur at the left upper sternal border.  Pulmonary/Chest: Effort normal and breath sounds normal. He exhibits no bony tenderness.  Abdominal: Soft. Normal appearance. There is no tenderness.  Musculoskeletal: Normal range of motion.  Neurological: He is alert and oriented to person, place, and time. He has normal strength. No cranial nerve deficit or sensory deficit. He exhibits normal muscle tone. Coordination normal.  Skin: Skin is warm, dry and intact.  Psychiatric: He has a normal mood and affect. His behavior is normal. Judgment and thought content normal.    ED Course  Procedures (including critical care time)   Date: 03/30/2012  Rate: 48  Rhythm: sinus bradycardia  QRS Axis: left  Intervals: normal  ST/T Wave abnormalities: nonspecific T wave changes  Conduction Disutrbances:none  Narrative Interpretation: low voltage and frequent PVC  Old EKG Reviewed: changes noted  IV heparin per pharmacy protocol, started for possible PE. Lasix for congestive heart failure.  15:00- case  discussed with Dr. Sharyn Lull; will admit, the patient and asked that I write holding orders.  CRITICAL CARE Performed by: Mancel Bale L   Total critical care time: 35 min.  Critical care time was exclusive of separately billable procedures and treating other patients.  Critical care was necessary to treat or prevent imminent or life-threatening deterioration.  Critical care was time spent personally by me on the following activities: development of treatment plan with patient and/or surrogate as well as nursing, discussions with consultants, evaluation of  patient's response to treatment, examination of patient, obtaining history from patient or surrogate, ordering and performing treatments and interventions, ordering and review of laboratory studies, ordering and review of radiographic studies, pulse oximetry and re-evaluation of patient's condition.  Labs Reviewed  CBC - Abnormal; Notable for the following:    Hemoglobin 12.2 (*)    HCT 37.6 (*)    RDW 17.5 (*)    Platelets 139 (*)    All other components within normal limits  BASIC METABOLIC PANEL - Abnormal; Notable for the following:    Glucose, Bld 119 (*)    BUN 37 (*)    Creatinine, Ser 2.60 (*)    GFR calc non Af Amer 22 (*)    GFR calc Af Amer 26 (*)    All other components within normal limits  URINALYSIS, ROUTINE W REFLEX MICROSCOPIC - Abnormal; Notable for the following:    Protein, ur 30 (*)    All other components within normal limits  PRO B NATRIURETIC PEPTIDE - Abnormal; Notable for the following:    Pro B Natriuretic peptide (BNP) 2752.0 (*)    All other components within normal limits  D-DIMER, QUANTITATIVE - Abnormal; Notable for the following:    D-Dimer, Quant 3.06 (*)    All other components within normal limits  DIFFERENTIAL  TROPONIN I  URINE MICROSCOPIC-ADD ON  URINE CULTURE  APTT  PROTIME-INR   Dg Chest Port 1v Same Day  03/30/2012  *RADIOLOGY REPORT*  Clinical Data: Dizziness, history of congestive heart failure  PORTABLE CHEST - 1 VIEW SAME DAY  Comparison: Chest x-ray of 03/14/2012 and 04/01/2011  Findings: There is moderate cardiomegaly present with pulmonary vascular congestion.  A small left effusion cannot be excluded. Median sternotomy sutures are noted from prior CABG.  IMPRESSION: Cardiomegaly and moderate pulmonary vascular congestion.  Original Report Authenticated By: Juline Patch, M.D.     1. Dizziness   2. Congestive heart failure       MDM  Nonspecific dizziness, doubt ACS, or pneumonia. Chest x-ray, and BNP are consistent with  fluid overload and mild congestive heart failure. IV Lasix ordered. Nuclear medicine study is pending at the time of admission to evaluate for pulmonary embolus. He has been started on heparin.           Flint Melter, MD 03/30/12 [phone]

## 2012-03-31 LAB — BASIC METABOLIC PANEL
CO2: 24 mEq/L (ref 19–32)
Calcium: 8.9 mg/dL (ref 8.4–10.5)
Chloride: 110 mEq/L (ref 96–112)
Glucose, Bld: 95 mg/dL (ref 70–99)
Potassium: 4.4 mEq/L (ref 3.5–5.1)
Sodium: 144 mEq/L (ref 135–145)

## 2012-03-31 LAB — GLUCOSE, CAPILLARY
Glucose-Capillary: 79 mg/dL (ref 70–99)
Glucose-Capillary: 126 mg/dL — ABNORMAL HIGH (ref 70–99)
Glucose-Capillary: 92 mg/dL (ref 70–99)

## 2012-03-31 LAB — CBC
Hemoglobin: 11.6 g/dL — ABNORMAL LOW (ref 13.0–17.0)
MCH: 29 pg (ref 26.0–34.0)
MCV: 89 fL (ref 78.0–100.0)
Platelets: 131 10*3/uL — ABNORMAL LOW (ref 150–400)
RBC: 4 MIL/uL — ABNORMAL LOW (ref 4.22–5.81)
WBC: 7.8 10*3/uL (ref 4.0–10.5)

## 2012-03-31 LAB — TSH: TSH: 1.056 u[IU]/mL (ref 0.350–4.500)

## 2012-03-31 LAB — CARDIAC PANEL(CRET KIN+CKTOT+MB+TROPI)
Relative Index: 2.8 — ABNORMAL HIGH (ref 0.0–2.5)
Total CK: 130 U/L (ref 7–232)

## 2012-03-31 LAB — MAGNESIUM: Magnesium: 2 mg/dL (ref 1.5–2.5)

## 2012-03-31 MED ORDER — LEVALBUTEROL HCL 1.25 MG/0.5ML IN NEBU
1.2500 mg | INHALATION_SOLUTION | Freq: Three times a day (TID) | RESPIRATORY_TRACT | Status: DC
Start: 2012-03-31 — End: 2012-04-02
  Administered 2012-03-31 – 2012-04-02 (×7): 1.25 mg via RESPIRATORY_TRACT
  Filled 2012-03-31 (×10): qty 0.5

## 2012-03-31 MED ORDER — IPRATROPIUM BROMIDE 0.02 % IN SOLN
0.5000 mg | Freq: Three times a day (TID) | RESPIRATORY_TRACT | Status: DC
  Administered 2012-03-31 – 2012-04-02 (×7): 0.5 mg via RESPIRATORY_TRACT
  Filled 2012-03-31 (×9): qty 2.5

## 2012-03-31 NOTE — Progress Notes (Signed)
  Echocardiogram 2D Echocardiogram has been performed.  [name] [name] [facility] 03/31/2012, 9:53 AM

## 2012-03-31 NOTE — Progress Notes (Signed)
Subjective:  Breathing better.  Objective:  Vital Signs in the last 24 hours: Temp:  [97.7 F (36.5 C)-97.9 F (36.6 C)] 97.7 F (36.5 C) (06/08 1400) Pulse Rate:  [63-72] 72  (06/08 1400) Cardiac Rhythm:  [-] Normal sinus rhythm (06/08 0800) Resp:  [16-21] 16  (06/08 1400) BP: (152-155)/(82-96) 153/96 mmHg (06/08 1400) SpO2:  [99 %-100 %] 100 % (06/08 1400) Weight:  [82.3 kg (181 lb 7 oz)] 82.3 kg (181 lb 7 oz) (06/08 0508)  Physical Exam: BP Readings from Last 1 Encounters:  03/31/12 153/96    Wt Readings from Last 1 Encounters:  03/31/12 82.3 kg (181 lb 7 oz)    Weight change:   HEENT: [place]/AT, Eyes-Brown, PERL, EOMI, Conjunctiva-Pink, Sclera-Non-icteric Neck: Full JVD at 60 degree, No bruit, Trachea midline. Lungs:  Crackles, Bilateral. Cardiac:  Regular rhythm, normal S1 and S2, no S3.  Abdomen:  Soft, non-tender. Extremities:  1 + edema present. No cyanosis. No clubbing. CNS: AxOx3, Cranial nerves grossly intact, moves all 4 extremities. Right handed. Skin: Warm and dry.   Intake/Output from previous day: 06/07 0701 - 06/08 0700 In: 170 [I.V.:120; IV Piggyback:50] Out: 1100 [Urine:1100]    Lab Results: BMET    Component Value Date/Time   NA 144 03/31/2012 0357   K 4.4 03/31/2012 0357   CL 110 03/31/2012 0357   CO2 24 03/31/2012 0357   GLUCOSE 95 03/31/2012 0357   BUN 39* 03/31/2012 0357   CREATININE 2.79* 03/31/2012 0357   CALCIUM 8.9 03/31/2012 0357   GFRNONAA 20* 03/31/2012 0357   GFRAA 24* 03/31/2012 0357   CBC    Component Value Date/Time   WBC 7.8 03/31/2012 0357   RBC 4.00* 03/31/2012 0357   HGB 11.6* 03/31/2012 0357   HCT 35.6* 03/31/2012 0357   PLT 131* 03/31/2012 0357   MCV 89.0 03/31/2012 0357   MCH 29.0 03/31/2012 0357   MCHC 32.6 03/31/2012 0357   RDW 17.5* 03/31/2012 0357   LYMPHSABS 1.5 03/30/2012 1212   MONOABS 0.6 03/30/2012 1212   EOSABS 0.1 03/30/2012 1212   BASOSABS 0.0 03/30/2012 1212   CARDIAC ENZYMES Lab Results  Component Value Date   CKTOTAL 130 03/31/2012   CKMB 3.2 03/31/2012   TROPONINI <0.30 03/31/2012    Assessment/Plan:  Patient Active Hospital Problem List: Decompensated congestive systolic heart failure Coronary artery disease  History of MI in the past rule out ischemia rule out MI  History of aortic stenosis  Status post CABG and aVR  Hypertension  Non-insulin-dependent diabetes mellitus controlled by diet  Chronic kidney disease stage IV  Hypercholesteremia  History of nonsustained VT and SVT in the past  Bronchitis  Continue diuresis   LOS: 1 day    Orpah Cobb  MD  03/31/2012, 5:20 PM

## 2012-03-31 NOTE — Progress Notes (Signed)
[name] spoke with pt concerning Md order for Home Heart failure Screening. Per pt [name] to contact HPOA, sister-n-law, [name] [phone] concerning dc planning. [name] spoke to CalvinBarry [name], per family choice [name] [name] to provide [facility] services. [name] [name] rep [name] notified of new referral. Pt states very active, drives self to PCP appt. PCP Dr.Harwani. Pt states having no problem affording prescribed medications. No DME needs stated.    [name] [name] [phone]

## 2012-04-01 LAB — BASIC METABOLIC PANEL
Chloride: 106 mEq/L (ref 96–112)
GFR calc Af Amer: 24 mL/min — ABNORMAL LOW (ref >90)
Potassium: 4.3 mEq/L (ref 3.5–5.1)
Sodium: 140 mEq/L (ref 135–145)

## 2012-04-01 LAB — GLUCOSE, CAPILLARY
Glucose-Capillary: 79 mg/dL (ref 70–99)
Glucose-Capillary: 60 mg/dL — ABNORMAL LOW (ref 70–99)
Glucose-Capillary: 145 mg/dL — ABNORMAL HIGH (ref 70–99)
Glucose-Capillary: 98 mg/dL (ref 70–99)

## 2012-04-01 LAB — URINE CULTURE: Culture  Setup Time: 201306080134

## 2012-04-01 LAB — CBC
Platelets: 113 10*3/uL — ABNORMAL LOW (ref 150–400)
RDW: 17.4 % — ABNORMAL HIGH (ref 11.5–15.5)
WBC: 8.8 10*3/uL (ref 4.0–10.5)

## 2012-04-01 MED ORDER — POTASSIUM CHLORIDE CRYS ER 10 MEQ PO TBCR
10.0000 meq | EXTENDED_RELEASE_TABLET | Freq: Every day | ORAL | Status: DC
  Administered 2012-04-02 – 2012-04-05 (×3): 10 meq via ORAL
  Filled 2012-04-01 (×4): qty 1

## 2012-04-01 MED ORDER — AMLODIPINE BESYLATE 2.5 MG PO TABS
2.5000 mg | ORAL_TABLET | Freq: Every day | ORAL | Status: DC
  Administered 2012-04-01 – 2012-04-05 (×5): 2.5 mg via ORAL
  Filled 2012-04-01 (×5): qty 1

## 2012-04-01 NOTE — Progress Notes (Addendum)
Recent 6 bt run VT.  Checked pt.   Calm and Talking with visitors. No concerns voiced.  Dr. Algie Coffer rounding. Informed of short run VT.

## 2012-04-01 NOTE — Progress Notes (Signed)
Breathing better today. Yesterday SOB with talking. Today ambulated to BR and in room with minimal dyspnea.  CBG 60 at midday.  Denied hypoglycemic sx.  Ate snack of juice and p-nut butter grahams, with CBG increasing to 89.

## 2012-04-01 NOTE — Progress Notes (Signed)
Subjective:  No new complaints. Able to enjoy company and talk without shortness of breath.  Objective:  Vital Signs in the last 24 hours: Temp:  [97.5 F (36.4 C)-97.9 F (36.6 C)] 97.5 F (36.4 C) (06/09 0602) Pulse Rate:  [55-72] 61  (06/09 0614) Cardiac Rhythm:  [-] Normal sinus rhythm (06/09 0800) Resp:  [16-17] 17  (06/09 0602) BP: (153-177)/(92-98) 175/92 mmHg (06/09 0614) SpO2:  [95 %-100 %] 95 % (06/09 0825) Weight:  [81.194 kg (179 lb)] 81.194 kg (179 lb) (06/09 0602)  Physical Exam: BP Readings from Last 1 Encounters:  04/01/12 175/92    Wt Readings from Last 1 Encounters:  04/01/12 81.194 kg (179 lb)    Weight change: -3.506 kg (-7 lb 11.7 oz)  HEENT: [place]/AT, Eyes-Brown, PERL, EOMI, Conjunctiva-Pink, Sclera-Non-icteric Neck: No JVD, No bruit, Trachea midline. Lungs:  Clear, Bilateral. Cardiac:  Regular rhythm, normal S1 and S2, no S3.  Abdomen:  Soft, non-tender. Extremities:  Trace edema present. No cyanosis. No clubbing. CNS: AxOx3, Cranial nerves grossly intact, moves all 4 extremities. Right handed. Skin: Warm and dry.   Intake/Output from previous day: 06/08 0701 - 06/09 0700 In: 1610 [P.O.:1560; IV Piggyback:50] Out: 3075 [Urine:3075]    Lab Results: BMET    Component Value Date/Time   NA 140 04/01/2012 0715   K 4.3 04/01/2012 0715   CL 106 04/01/2012 0715   CO2 26 04/01/2012 0715   GLUCOSE 96 04/01/2012 0715   BUN 43* 04/01/2012 0715   CREATININE 2.72* 04/01/2012 0715   CALCIUM 8.9 04/01/2012 0715   GFRNONAA 21* 04/01/2012 0715   GFRAA 24* 04/01/2012 0715   CBC    Component Value Date/Time   WBC 8.8 04/01/2012 0715   RBC 4.33 04/01/2012 0715   HGB 12.4* 04/01/2012 0715   HCT 38.5* 04/01/2012 0715   PLT 113* 04/01/2012 0715   MCV 88.9 04/01/2012 0715   MCH 28.6 04/01/2012 0715   MCHC 32.2 04/01/2012 0715   RDW 17.4* 04/01/2012 0715   LYMPHSABS 1.5 03/30/2012 1212   MONOABS 0.6 03/30/2012 1212   EOSABS 0.1 03/30/2012 1212   BASOSABS 0.0 03/30/2012 1212   CARDIAC ENZYMES Lab  Results  Component Value Date   CKTOTAL 130 03/31/2012   CKMB 3.2 03/31/2012   TROPONINI <0.30 03/31/2012    Assessment/Plan:  Patient Active Hospital Problem List:  Decompensated congestive systolic heart failure  Coronary artery disease  History of MI in the past rule out ischemia rule out MI  History of aortic stenosis  Status post CABG and aVR  Hypertension  Non-insulin-dependent diabetes mellitus controlled by diet  Chronic kidney disease stage IV  Hypercholesteremia  History of nonsustained VT and SVT in the past  Bronchitis   Continue diuresis.     LOS: 2 days    Orpah Cobb  MD  04/01/2012, 1:05 PM

## 2012-04-02 LAB — BASIC METABOLIC PANEL
CO2: 26 mEq/L (ref 19–32)
Calcium: 8.5 mg/dL (ref 8.4–10.5)
Glucose, Bld: 86 mg/dL (ref 70–99)
Sodium: 138 mEq/L (ref 135–145)

## 2012-04-02 LAB — CBC
Hemoglobin: 11.5 g/dL — ABNORMAL LOW (ref 13.0–17.0)
MCH: 28.7 pg (ref 26.0–34.0)
MCV: 87.5 fL (ref 78.0–100.0)
RBC: 4.01 MIL/uL — ABNORMAL LOW (ref 4.22–5.81)

## 2012-04-02 LAB — GLUCOSE, CAPILLARY
Glucose-Capillary: 92 mg/dL (ref 70–99)
Glucose-Capillary: 84 mg/dL (ref 70–99)

## 2012-04-02 MED ORDER — LEVALBUTEROL HCL 1.25 MG/0.5ML IN NEBU
1.2500 mg | INHALATION_SOLUTION | RESPIRATORY_TRACT | Status: DC | PRN
  Filled 2012-04-02: qty 0.5

## 2012-04-02 NOTE — Progress Notes (Signed)
Subjective:  Patient denies any chest pain states breathing has gradually improved  Objective:  Vital Signs in the last 24 hours: Temp:  [97.8 F (36.6 C)-98.1 F (36.7 C)] 97.9 F (36.6 C) (06/10 1415) Pulse Rate:  [67-73] 67  (06/10 1415) Resp:  [18] 18  (06/10 1415) BP: (134-151)/(75-83) 151/79 mmHg (06/10 1415) SpO2:  [96 %-99 %] 98 % (06/10 1415) Weight:  [80.65 kg (177 lb 12.8 oz)] 80.65 kg (177 lb 12.8 oz) (06/10 0643)  Intake/Output from previous day: 06/09 0701 - 06/10 0700 In: 650 [P.O.:600; IV Piggyback:50] Out: 426 [Urine:425; Stool:1] Intake/Output from this shift: Total I/O In: 240 [P.O.:240] Out: -   Physical Exam: Neck: no adenopathy, no carotid bruit, no JVD and supple, symmetrical, trachea midline Lungs: Decreased breath sound at bases with bilateral rales Heart: regular rate and rhythm, S1, S2 normal and Soft systolic murmur and S3 gallop noted Abdomen: soft, non-tender; bowel sounds normal; no masses,  no organomegaly Extremities: No clubbing cyanosis 1+ edema  Lab Results:  Basename 04/02/12 0443 04/01/12 0715  WBC 7.7 8.8  HGB 11.5* 12.4*  PLT 106* 113*    Basename 04/02/12 0443 04/01/12 0715  NA 138 140  K 3.8 4.3  CL 103 106  CO2 26 26  GLUCOSE 86 96  BUN 46* 43*  CREATININE 2.89* 2.72*    Basename 03/31/12 1120 03/31/12 0357  TROPONINI <0.30 <0.30   Hepatic Function Panel No results found for this basename: PROT,ALBUMIN,AST,ALT,ALKPHOS,BILITOT,BILIDIR,IBILI in the last 72 hours No results found for this basename: CHOL in the last 72 hours No results found for this basename: PROTIME in the last 72 hours  Imaging: Imaging results have been reviewed and No results found.  Cardiac Studies:  Assessment/Plan: Resolving congestive systolic heart/diastolic failureCoronary artery disease history of MI in the past rule out ischemia rule out MI  History of aortic stenosis  Status post CABG and aVR  Hypertension  Non-insulin-dependent  diabetes mellitus controlled by diet  Chronic kidney disease stage IV  Hypercholesteremia  History of nonsustained VT and SVT in the past  Bronchitis Plan Continue present management Chest x-ray in a.m. Check labs in a.m.  LOS: 3 days    [name] [name] N 04/02/2012, 4:56 PM

## 2012-04-03 ENCOUNTER — Inpatient Hospital Stay (HOSPITAL_COMMUNITY): Payer: Medicare Other

## 2012-04-03 LAB — GLUCOSE, CAPILLARY
Glucose-Capillary: 88 mg/dL (ref 70–99)
Glucose-Capillary: 97 mg/dL (ref 70–99)

## 2012-04-03 LAB — CBC
MCH: 28.5 pg (ref 26.0–34.0)
MCHC: 32.7 g/dL (ref 30.0–36.0)
Platelets: 108 10*3/uL — ABNORMAL LOW (ref 150–400)
RBC: 4.04 MIL/uL — ABNORMAL LOW (ref 4.22–5.81)
RDW: 17.1 % — ABNORMAL HIGH (ref 11.5–15.5)

## 2012-04-03 LAB — BASIC METABOLIC PANEL
CO2: 25 mEq/L (ref 19–32)
Calcium: 8.7 mg/dL (ref 8.4–10.5)
Chloride: 102 mEq/L (ref 96–112)
Creatinine, Ser: 2.91 mg/dL — ABNORMAL HIGH (ref 0.50–1.35)
GFR calc Af Amer: 22 mL/min — ABNORMAL LOW (ref >90)
Sodium: 138 mEq/L (ref 135–145)

## 2012-04-03 LAB — PRO B NATRIURETIC PEPTIDE: Pro B Natriuretic peptide (BNP): 1743 pg/mL — ABNORMAL HIGH (ref 0–450)

## 2012-04-03 NOTE — Progress Notes (Signed)
Subjective:  Patient denies any chest pain breathing gradually improving. Denies any cough fever or chills now.  Objective:  Vital Signs in the last 24 hours: Temp:  [98.2 F (36.8 C)] 98.2 F (36.8 C) (06/11 1347) Pulse Rate:  [67-76] 73  (06/11 1347) Resp:  [18-19] 18  (06/11 1347) BP: (131-160)/(63-92) 134/75 mmHg (06/11 1347) SpO2:  [93 %-97 %] 97 % (06/11 1347) Weight:  [79.153 kg (174 lb 8 oz)] 79.153 kg (174 lb 8 oz) (06/11 0630)  Intake/Output from previous day: 06/10 0701 - 06/11 0700 In: 530 [P.O.:480; IV Piggyback:50] Out: 150 [Urine:150] Intake/Output from this shift: Total I/O In: 603 [P.O.:600; I.V.:3] Out: -   Physical Exam: Neck: no adenopathy, no carotid bruit, no JVD and supple, symmetrical, trachea midline Lungs: Decreased breath sound at bases with occasional basilar rales Heart: regular rate and rhythm, S1, S2 normal and Soft systolic murmur and S3 gallop noted Abdomen: soft, non-tender; bowel sounds normal; no masses,  no organomegaly Extremities: No clubbing cyanosis trace edema noted  Lab Results:  Basename 04/03/12 0438 04/02/12 0443  WBC 7.4 7.7  HGB 11.5* 11.5*  PLT 108* 106*    Basename 04/03/12 0438 04/02/12 0443  NA 138 138  K 4.1 3.8  CL 102 103  CO2 25 26  GLUCOSE 74 86  BUN 44* 46*  CREATININE 2.91* 2.89*   No results found for this basename: TROPONINI:2,CK,MB:2 in the last 72 hours Hepatic Function Panel No results found for this basename: PROT,ALBUMIN,AST,ALT,ALKPHOS,BILITOT,BILIDIR,IBILI in the last 72 hours No results found for this basename: CHOL in the last 72 hours No results found for this basename: PROTIME in the last 72 hours  Imaging: Imaging results have been reviewed and Dg Chest 2 View  04/03/2012  *RADIOLOGY REPORT*  Clinical Data: History of chronic intermittent coughing. Congestive heart failure follow-up.  History of diabetes.  CHEST - 2 VIEW  Comparison: 03/14/2012.  03/30/2012.  Findings: There is moderate  enlargement of the cardiac silhouette which has decreased size since prior examination. The patient has undergone previous median sternotomy, valvular replacement procedure, and coronary artery bypass grafting. Ectasia and nonaneurysmal calcification of the thoracic aorta are seen.  There is no evidence of right pleural effusion.  There is chronic noncalcific pleural thickening involving the left costophrenic angle which has been stable over multiple examinations.  There is minimal osteophyte formation in the spine.  IMPRESSION: Moderate enlargement of the cardiac silhouette.  Decreased size of the cardiac silhouette since prior study.  No pulmonary edema, pneumonia, or right pleural effusion is seen.  Chronic noncalcific pleural thickening on the left is felt to be fibrotic change and is stable.  Original Report Authenticated By: Crawford Givens, M.D.    Cardiac Studies:  Assessment/Plan:  Resolving congestive systolic heart/diastolic failureCoronary artery disease history of MI in the past rule out ischemia rule out MI  History of aortic stenosis  Status post CABG and aVR  Hypertension  Non-insulin-dependent diabetes mellitus controlled by diet  Chronic kidney disease stage IV  Hypercholesteremia  History of nonsustained VT and SVT in the past  Bronchitis  Plan Will schedule for nuclear stress test in a.m. OT PT consult  LOS: 4 days    Leanna Hamid N 04/03/2012, 4:51 PM

## 2012-04-04 ENCOUNTER — Inpatient Hospital Stay (HOSPITAL_COMMUNITY)
Admission: RE | Admit: 2012-04-04 | Discharge: 2012-04-04 | Disposition: A | Discharge status: Home / Self Care | Payer: Medicare Other | Source: Ambulatory Visit | Attending: Cardiology | Admitting: Cardiology

## 2012-04-04 LAB — CBC
HCT: 35.9 % — ABNORMAL LOW (ref 39.0–52.0)
Hemoglobin: 12 g/dL — ABNORMAL LOW (ref 13.0–17.0)
MCV: 87.8 fL (ref 78.0–100.0)
WBC: 7.1 10*3/uL (ref 4.0–10.5)

## 2012-04-04 LAB — GLUCOSE, CAPILLARY
Glucose-Capillary: 93 mg/dL (ref 70–99)
Glucose-Capillary: 125 mg/dL — ABNORMAL HIGH (ref 70–99)

## 2012-04-04 MED ORDER — TECHNETIUM TC 99M TETROFOSMIN IV KIT
10.0000 | PACK | Freq: Once | INTRAVENOUS | Status: AC | PRN
  Administered 2012-04-04: 10 via INTRAVENOUS

## 2012-04-04 MED ORDER — REGADENOSON 0.4 MG/5ML IV SOLN
0.4000 mg | Freq: Once | INTRAVENOUS | Status: AC
  Administered 2012-04-04: 0.4 mg via INTRAVENOUS

## 2012-04-04 MED ORDER — TECHNETIUM TC 99M TETROFOSMIN IV KIT
30.0000 | PACK | Freq: Once | INTRAVENOUS | Status: AC | PRN
  Administered 2012-04-04: 30 via INTRAVENOUS

## 2012-04-04 NOTE — Progress Notes (Signed)
OT/PT Notes:  Pt is scheduled for nuclear stress test to r/o MI.  Will reattempt evals at another time.  [place], OTR/L [phone] 04/04/2012

## 2012-04-04 NOTE — Progress Notes (Signed)
PT Cancellation Note  Treatment cancelled today due to medical issues that prevent  Therapy.Rada Hay 04/04/2012, 9:02 AM [phone]

## 2012-04-04 NOTE — Progress Notes (Signed)
Subjective:  Patient denies any chest pain states breathing is gradually improved  Objective:  Vital Signs in the last 24 hours: Temp:  [98.2 F (36.8 C)-98.6 F (37 C)] 98.6 F (37 C) (06/12 0459) Pulse Rate:  [70-76] 75  (06/12 1147) Resp:  [18-20] 20  (06/12 0459) BP: (132-176)/(75-113) 147/83 mmHg (06/12 1147) SpO2:  [97 %] 97 % (06/12 0459) Weight:  [80.2 kg (176 lb 12.9 oz)] 80.2 kg (176 lb 12.9 oz) (06/12 0459)  Intake/Output from previous day: 06/11 0701 - 06/12 0700 In: 843 [P.O.:840; I.V.:3] Out: -  Intake/Output from this shift:    Physical Exam: Neck: no adenopathy, no carotid bruit, no JVD and supple, symmetrical, trachea midline Lungs: Decrease pressor bases with occasional rails Heart: regular rate and rhythm, S1, S2 normal and Soft systolic murmur noted and faint S3 gallop Abdomen: soft, non-tender; bowel sounds normal; no masses,  no organomegaly Extremities: extremities normal, atraumatic, no cyanosis or edema  Lab Results:  Basename 04/04/12 0455 04/03/12 0438  WBC 7.1 7.4  HGB 12.0* 11.5*  PLT 110* 108*    Basename 04/03/12 0438 04/02/12 0443  NA 138 138  K 4.1 3.8  CL 102 103  CO2 25 26  GLUCOSE 74 86  BUN 44* 46*  CREATININE 2.91* 2.89*   No results found for this basename: TROPONINI:2,CK,MB:2 in the last 72 hours Hepatic Function Panel No results found for this basename: PROT,ALBUMIN,AST,ALT,ALKPHOS,BILITOT,BILIDIR,IBILI in the last 72 hours No results found for this basename: CHOL in the last 72 hours No results found for this basename: PROTIME in the last 72 hours  Imaging: Imaging results have been reviewed and Dg Chest 2 View  04/03/2012  *RADIOLOGY REPORT*  Clinical Data: History of chronic intermittent coughing. Congestive heart failure follow-up.  History of diabetes.  CHEST - 2 VIEW  Comparison: 03/14/2012.  03/30/2012.  Findings: There is moderate enlargement of the cardiac silhouette which has decreased size since prior  examination. The patient has undergone previous median sternotomy, valvular replacement procedure, and coronary artery bypass grafting. Ectasia and nonaneurysmal calcification of the thoracic aorta are seen.  There is no evidence of right pleural effusion.  There is chronic noncalcific pleural thickening involving the left costophrenic angle which has been stable over multiple examinations.  There is minimal osteophyte formation in the spine.  IMPRESSION: Moderate enlargement of the cardiac silhouette.  Decreased size of the cardiac silhouette since prior study.  No pulmonary edema, pneumonia, or right pleural effusion is seen.  Chronic noncalcific pleural thickening on the left is felt to be fibrotic change and is stable.  Original Report Authenticated By: Crawford Givens, M.D.    Cardiac Studies:  Assessment/Plan:  Resolving congestive systolic heart/diastolic failureCoronary artery disease history of MI in the past rule out ischemia rule out MI  History of aortic stenosis  Status post CABG and aVR  Hypertension  Non-insulin-dependent diabetes mellitus controlled by diet  Chronic kidney disease stage IV  Hypercholesteremia  History of nonsustained VT and SVT in the past  Resolving Bronchitis  Plan Schedule for nuclear stress test today Continue present management Check labs in a.m.  LOS: 5 days    [name] [name] 04/04/2012, 11:58 AM

## 2012-04-05 LAB — BASIC METABOLIC PANEL
CO2: 29 mEq/L (ref 19–32)
Calcium: 9.1 mg/dL (ref 8.4–10.5)
Creatinine, Ser: 3.16 mg/dL — ABNORMAL HIGH (ref 0.50–1.35)
GFR calc non Af Amer: 18 mL/min — ABNORMAL LOW (ref >90)
Glucose, Bld: 101 mg/dL — ABNORMAL HIGH (ref 70–99)
Sodium: 137 mEq/L (ref 135–145)

## 2012-04-05 LAB — CBC
MCH: 28.6 pg (ref 26.0–34.0)
MCHC: 32.5 g/dL (ref 30.0–36.0)
MCV: 87.9 fL (ref 78.0–100.0)
Platelets: 112 10*3/uL — ABNORMAL LOW (ref 150–400)
RBC: 4.3 MIL/uL (ref 4.22–5.81)

## 2012-04-05 LAB — PRO B NATRIURETIC PEPTIDE: Pro B Natriuretic peptide (BNP): 1633 pg/mL — ABNORMAL HIGH (ref 0–450)

## 2012-04-05 MED ORDER — FUROSEMIDE 40 MG PO TABS: 80.0000 mg | ORAL_TABLET | Freq: Two times a day (BID) | ORAL | Status: DC

## 2012-04-05 MED ORDER — POTASSIUM CHLORIDE CRYS ER 10 MEQ PO TBCR: 10.0000 meq | EXTENDED_RELEASE_TABLET | Freq: Every day | ORAL | Status: DC

## 2012-04-05 MED ORDER — CARVEDILOL 3.125 MG PO TABS: 3.1250 mg | ORAL_TABLET | Freq: Two times a day (BID) | ORAL | Status: DC

## 2012-04-05 MED ORDER — ASPIRIN 81 MG PO TBEC: 81.0000 mg | DELAYED_RELEASE_TABLET | Freq: Every day | ORAL | Status: DC

## 2012-04-05 NOTE — Discharge Summary (Signed)
  Discharge summary dictated on 04/05/2012 dictation number is [id number]

## 2012-04-05 NOTE — Evaluation (Signed)
Physical Therapy Evaluation Patient Details Name: [name] [name] MRN: [id number] DOB: 1933/03/28 Today's Date: 04/05/2012 Time: 0865-7846 PT Time Calculation (min): 22 min  PT Assessment / Plan / Recommendation Clinical Impression  pt admitted w/ dizziness, CHF. pt lives alone w/ assistance from family as needed. Independent w/ ADL's. pt demonstrates no difficulties w/ ambulation. no further PT needed. will sign off.    PT Assessment  Patent does not need any further PT services    Follow Up Recommendations  No PT follow up    Barriers to Discharge        lEquipment Recommendations  None recommended by PT    Recommendations for Other Services     Frequency      Precautions / Restrictions Precautions Precautions: None Restrictions Weight Bearing Restrictions: No   Pertinent Vitals/Pain HR 71-93 walking      Mobility  Bed Mobility Bed Mobility: Supine to Sit Supine to Sit: 7: Independent Transfers Transfers: Sit to Stand Sit to Stand: 7: Independent Ambulation/Gait Ambulation/Gait Assistance: 7: Independent Ambulation Distance (Feet): 400 Feet Assistive device: None Ambulation/Gait Assistance Details: no balance issues. Stairs: No    Exercises     PT Diagnosis:    PT Problem List:   PT Treatment Interventions:     PT Goals    Visit Information  Last PT Received On: 04/05/12 Assistance Needed: +1    Subjective Data  Subjective: i am going home i hope Patient Stated Goal: to go home   Prior Functioning  Home Living Lives With: Alone Available Help at Discharge: Family;Available PRN/intermittently Type of Home: House Home Access: Stairs to enter [employer] of Steps: 3 Entrance Stairs-Rails: None Home Layout: One level Bathroom Shower/Tub: Engineer, manufacturing systems: Standard Bathroom Accessibility: Yes Home Adaptive Equipment: Environmental consultant - rolling;Crutches;Straight cane Prior Function Level of Independence: Independent Able to  Take Stairs?: Yes Driving: Yes Vocation: Retired Musician: No difficulties    Cognition  Overall Cognitive Status: Appears within functional limits for tasks assessed/performed Arousal/Alertness: Awake/alert Orientation Level: Appears intact for tasks assessed Behavior During Session: [facility] for tasks performed    Extremity/Trunk Assessment Right Lower Extremity Assessment RLE ROM/Strength/Tone: WFL for tasks assessed RLE Sensation: WFL - Light Touch Left Lower Extremity Assessment LLE ROM/Strength/Tone: WFL for tasks assessed LLE Sensation: WFL - Light Touch   Balance    End of Session PT - End of Session Activity Tolerance: Patient tolerated treatment well Patient left:  (up walking in hall)   Rada Hay 04/05/2012, 9:59 AM  [phone]

## 2012-04-05 NOTE — Progress Notes (Signed)
Talked to patient with sister present - [name] about DCP; patient is independent of all ADL, continues to drive, cook and do things around the house. Patient stated that he has scales and weighs himself everyday and takes his blood pressure. Patient is very cheerful and egar to go home; B Lobbyist, BSN, [place]

## 2012-04-05 NOTE — Discharge Instructions (Signed)
Heart Failure Heart failure (HF) is a condition in which the heart has trouble pumping blood. This means your heart does not pump blood efficiently for your body to work well. In some cases of HF, fluid may back up into your lungs or you may have swelling (edema) in your lower legs. HF is a long-term (chronic) condition. It is important for you to take good care of yourself and follow your caregiver's treatment plan. CAUSES   Health conditions:   High blood pressure (hypertension) causes the heart muscle to work harder than normal. When pressure in the blood vessels is high, the heart needs to pump (contract) with more force in order to circulate blood throughout the body. High blood pressure eventually causes the heart to become stiff and weak.   Coronary artery disease (CAD) is the buildup of cholesterol and fat (plaques) in the arteries of the heart. The blockage in the arteries deprives the heart muscle of oxygen and blood. This can cause chest pain and may lead to a heart attack. High blood pressure can also contribute to CAD.   Heart attack (myocardial infarction) occurs when 1 or more arteries in the heart become blocked. The loss of oxygen damages the muscle tissue of the heart. When this happens, part of the heart muscle dies. The injured tissue does not contract as well and weakens the heart's ability to pump blood.   Abnormal heart valves can cause HF when the heart valves do not open and close properly. This makes the heart muscle pump harder to keep the blood flowing.   Heart muscle disease (cardiomyopathy or myocarditis) is damage to the heart muscle from a variety of causes. These can include drug or alcohol abuse, infections, or unknown reasons. These can increase the risk of HF.   Lung disease makes the heart work harder because the lungs do not work properly. This can cause a strain on the heart leading it to fail.   Diabetes increases the risk of HF. High blood sugar contributes  to high fat (lipid) levels in the blood. Diabetes can also cause slow damage to tiny blood vessels that carry important nutrients to the heart muscle. When the heart does not get enough oxygen and food, it can cause the heart to become weak and stiff. This leads to a heart that does not contract efficiently.   Other diseases can contribute to HF. These include abnormal heart rhythms, thyroid problems, and low blood counts (anemia).   Unhealthy lifestyle habits:   Obesity.   Smoking.   Eating foods high in fat and cholesterol.   Eating or drinking beverages high in salt.   Drug or alcohol abuse.   Lack of exercise.  SYMPTOMS  HF symptoms may vary and can be hard to detect. Symptoms may include:  Shortness of breath with activity, such as climbing stairs.   Persistent cough.   Swelling of the feet, ankles, legs, or abdomen.   Unexplained weight gain.   Difficulty breathing when lying flat.   Waking from sleep because of the need to sit up and get more air.   Rapid heartbeat.   Fatigue and loss of energy.   Feeling lightheaded or close to fainting.  DIAGNOSIS  A diagnosis of HF is based on your history, symptoms, physical examination, and diagnostic tests. Diagnostic tests for HF may include:  EKG.   Chest X-ray.   Blood tests.   Exercise stress test.   Blood oxygen test (arterial blood gas).   Evaluation   by a heart doctor (cardiologist).   Ultrasound evaluation of the heart (echocardiogram).   Heart artery test to look for blockages (angiogram).   Radioactive imaging to look at the heart (radionuclide test).  TREATMENT  Treatment is aimed at managing the symptoms of HF. Medicines, lifestyle changes, or surgical intervention may be necessary to treat HF.  Medicines to help treat HF may include:   Angiotensin-converting enzyme (ACE) inhibitors. These block the effects of a blood protein called angiotensin-converting enzyme. ACE inhibitors relax (dilate) the  blood vessels and help lower blood pressure. This decreases the workload of the heart, slows the progression of HF, and improves symptoms.   Angiotensin receptor blockers (ARBs). These medications work similar to ACE inhibitors. ARBs may be an alternative for people who cannot tolerate an ACE inhibitor.   Aldosterone antagonists. This medication helps get rid of extra fluid from your body. This lowers the volume of blood the heart has to pump.   Water pills (diuretics). Diuretics cause the kidneys to remove salt and water from the blood. The extra fluid is removed by urination. By removing extra fluid from the body, diuretics help lower the workload of the heart and help prevent fluid buildup in the lungs so breathing is easier.   Beta blockers. These prevent the heart from beating too fast and improve heart muscle strength. Beta blockers help maintain a normal heart rate, control blood pressure, and improve HF symptoms.   Digitalis. This increases the force of the heartbeat and may be helpful to people with HF or heart rhythm problems.   Healthy lifestyle changes include:   Stopping smoking.   Eating a healthy diet. Avoid foods high in fat. Avoid foods fried in oil or made with fat. A dietician can help with healthy food choices.   Limiting how much salt you eat.   Limiting alcohol intake to no more than 1 drink per day for women and 2 drinks per day for men. Drinking more than that is harmful to your heart. If your heart has already been damaged by alcohol or you have severe HF, drinking alcohol should be stopped completely.   Exercising as directed by your caregiver.   Surgical treatment for HF may include:   Procedures to open blocked arteries, repair damaged heart valves, or remove damaged heart muscle tissue.   A pacemaker to help heart muscle function and to control certain abnormal heart rhythms.   A defibrillator to possibly prevent sudden cardiac death.  HOME CARE  INSTRUCTIONS   Activity level. Your caregiver can help you determine what type of exercise program may be helpful. It is important to maintain your strength. Pace your physical activity to avoid shortness of breath or chest pain. Rest for 1 hour before and after meals. A cardiac rehabilitation program may be helpful to some people with HF.   Diet. Eat a heart healthy diet. Food choices should be low in saturated fat and cholesterol. Talk to a dietician to learn about heart healthy foods.   Salt intake. When you have HF, you need to limit the amount of salt you eat. Eat less than 1500 milligrams (mg) of salt per day or as recommended by your caregiver.   Weight monitoring. Weigh yourself every day. You should weigh yourself in the morning after you urinate and before you eat breakfast. Wear the same amount of clothing each time you weigh yourself. Record your weight daily. Bring your recorded weights to your clinic visits. Tell your caregiver right away if   you have gained 3 lb/1.4 kg in 1 day, or 5 lb/2.3 kg in a week or whatever amount you were told to report.   Blood pressure monitoring. This should be done as directed by your caregiver. A home blood pressure cuff can be purchased at a drugstore. Record your blood pressure numbers and bring them to your clinic visits. Tell your caregiver if you become dizzy or lightheaded upon standing up.   Smoking. If you are currently a smoker, it is time to quit. Nicotine makes your heart work harder by causing your blood vessels to constrict. Do not use nicotine gum or patches before talking to your caregiver.   Follow up. Be sure to schedule a follow-up visit with your caregiver. Keep all your appointments.  SEEK MEDICAL CARE IF:   Your weight increases by 3 lb/1.4 kg in 1 day or 5 lb/2.3 kg in a week.   You notice increasing shortness of breath that is unusual for you. This may happen during rest, sleep, or with activity.   You cough more than normal,  especially with physical activity.   You notice more swelling in your hands, feet, ankles, or belly (abdomen).   You are unable to sleep because it is hard to breathe.   You cough up bloody mucus (sputum).   You begin to feel "jumping" or "fluttering" sensations (palpitations) in your chest.  SEEK IMMEDIATE MEDICAL CARE IF:   You have severe chest pain or pressure which may include symptoms such as:   Pain or pressure in the arms, neck, jaw, or back.   Feeling sweaty.   Feeling sick to your stomach (nauseous).   Feeling short of breath while at rest.   Having a fast or irregular heartbeat.   You experience stroke symptoms. These symptoms include:   Facial weakness or numbness.   Weakness or numbness in an arm, leg, or on one side of your body.   Blurred vision.   Difficulty talking or thinking.   Dizziness or fainting.   Severe headache.  THESE ARE MEDICAL EMERGENCIES. Do not wait to see if the symptoms go away. Call your local emergency services (911 in [place] DO NOT drive yourself to the hospital. IMPORTANT  Make a list of every medicine, vitamin, or herbal supplement you are taking. Keep the list with you at all times. Show it to your caregiver at every visit. Keep the list up-to-date.   Ask your caregiver or pharmacist to write an explanation of each medicine you are taking. This should include:   Why you are taking it.   The possible side effects.   The best time of day to take it.   Foods to take with it or what foods to avoid.   When to stop taking it.  MAKE SURE YOU:   Understand these instructions.   Will watch your condition.   Will get help right away if you are not doing well or get worse.  Document Released: 10/10/2005 Document Revised: 09/29/2011 Document Reviewed: 01/22/2010 [employer] Patient Information 2012 [employer]. 

## 2012-04-05 NOTE — Evaluation (Signed)
Occupational Therapy Evaluation Patient Details Name: [name] [name] MRN: [id number] DOB: [birth year] Today's Date: 04/05/2012 Time: 0920-0950 OT Time Calculation (min): 30 min  OT Assessment / Plan / Recommendation Clinical Impression  Pt admitted with dizziness ina CHF.  Pt is independent in ADL and mobility.  Education provided in energy conservation and safety.  No further OT needs. Recommend home with intermittent assist of family.      OT Assessment  Patient does not need any further OT services    Follow Up Recommendations  No OT follow up;Supervision - Intermittent    Barriers to Discharge      Equipment Recommendations  None recommended by OT    Recommendations for Other Services    Frequency       Precautions / Restrictions Precautions Precautions: None Restrictions Weight Bearing Restrictions: No   Pertinent Vitals/Pain No pain    ADL  Eating/Feeding: Simulated;Independent Where Assessed - Eating/Feeding: Edge of bed Grooming: Simulated;Independent Where Assessed - Grooming: Unsupported standing Upper Body Bathing: Simulated;Independent Where Assessed - Upper Body Bathing: Unsupported standing Lower Body Bathing: Simulated;Independent Where Assessed - Lower Body Bathing: Unsupported standing Upper Body Dressing: Performed;Set up Where Assessed - Upper Body Dressing: Unsupported standing Lower Body Dressing: Performed;Independent Where Assessed - Lower Body Dressing: Unsupported sitting;Unsupported standing Toilet Transfer: Simulated;Independent Toilet Transfer Method: Sit to stand Transfers/Ambulation Related to ADLs: Ambulating independently in nursing unit and to bathroom. ADL Comments: Educated pt in energy conservation, pacing, work simplification.    OT Diagnosis:    OT Problem List:   OT Treatment Interventions:     OT Goals    Visit Information  Last OT Received On: 04/05/12 Assistance Needed: +1    Subjective Data  Subjective: "My  sister in law watches what I eat." Patient Stated Goal: Home with family's intermittent assist.   Prior Functioning  Home Living Lives With: Alone Available Help at Discharge: Family;Available PRN/intermittently Type of Home: House Home Access: Stairs to enter [employer] of Steps: 3 Entrance Stairs-Rails: None Home Layout: One level Bathroom Shower/Tub: Engineer, manufacturing systems: Standard Bathroom Accessibility: Yes Home Adaptive Equipment: Environmental consultant - rolling;Crutches;Straight cane Prior Function Level of Independence: Independent Able to Take Stairs?: Yes Driving: Yes Vocation: Retired Musician: No difficulties Dominant Hand: Right    Cognition  Overall Cognitive Status: Appears within functional limits for tasks assessed/performed Arousal/Alertness: Awake/alert Orientation Level: Appears intact for tasks assessed Behavior During Session: [facility] for tasks performed    Extremity/Trunk Assessment Right Upper Extremity Assessment RUE ROM/Strength/Tone: Within functional levels RUE Coordination: WFL - gross/fine motor Left Upper Extremity Assessment LUE ROM/Strength/Tone: Within functional levels LUE Coordination: WFL - gross/fine motor Right Lower Extremity Assessment RLE ROM/Strength/Tone: WFL for tasks assessed RLE Sensation: WFL - Light Touch Left Lower Extremity Assessment LLE ROM/Strength/Tone: WFL for tasks assessed LLE Sensation: WFL - Light Touch Trunk Assessment Trunk Assessment: Normal   Mobility Bed Mobility Bed Mobility: Supine to Sit Supine to Sit: 7: Independent Transfers Sit to Stand: 7: Independent   Exercise    Balance    End of Session OT - End of Session Activity Tolerance: Patient tolerated treatment well Patient left: Other (comment) (ambulating in unit)   Evern Bio 04/05/2012, 10:20 AM

## 2012-04-06 NOTE — Discharge Summary (Signed)
NAMEMarland [name]  ILAY, CAPSHAW NO.:  [id number]  MEDICAL RECORD NO.:  [id number]  LOCATION:  1436                         FACILITY:  [facility]  PHYSICIAN:  Crosley Stejskal N. Sharyn Lull, M.D. DATE OF BIRTH:  13-May-1933  DATE OF ADMISSION:  03/30/2012 DATE OF DISCHARGE:  04/05/2012                              DISCHARGE SUMMARY   ADMITTING DIAGNOSES: 1. Decompensated congestive systolic heart failure. 2. Coronary artery disease.  History of myocardial infarction in the     past, status post coronary artery bypass graft and aortic valve     replacement in the past.  History of aortic stenosis. 3. Hypertension. 4. Insulin-requiring diabetes mellitus, controlled by diet. 5. Chronic kidney disease, stage IV. 6. Hypercholesteremia. 7. History of nonsustained ventricular tachycardia and     supraventricular tachycardia in the past. 8. Bronchitis, rule out pneumonia.  FINAL DIAGNOSES: 1. Compensated systolic/diastolic heart failure, myocardial infarction     ruled out, negative nuclear stress study. 2. Coronary artery disease, history of myocardial infarction in the     past. 3. Ischemic cardiomyopathy. 4. Coronary artery disease and history of aortic stenosis, status post     coronary artery bypass graft and aortic valve replacement in the     past. 5. Hypertension. 6. Insulin-requiring diabetes mellitus, controlled by diet. 7. Chronic kidney disease, stage IV. 8. Hypercholesteremia. 9. History of nonsustained ventricular tachycardia and     supraventricular tachycardia in the past. 10.Status post bronchitis.  DISCHARGE HOME MEDICATIONS: 1. Enteric-coated aspirin 81 mg 1 tablet daily. 2. Carvedilol 3.125 mg 1 tablet twice daily. 3. K-Dur 10 mEq daily. 4. Lasix 40 mg 2 tablets twice daily. 5. BiDil one tablet 3 times daily.  DIET:  Low-salt, low-cholesterol 1800 calorie ADA diet/renal diet. Heart failure instructions have been given.  Follow up with me in 1 week.  CONDITION ON  DISCHARGE:  Stable.  BRIEF HISTORY AND HOSPITAL COURSE:  [name] [name] is a 78 year old male with past medical history significant for multiple medical problems, i.e., coronary artery disease, history of inferior wall myocardial infarction in the past, status post PTCA stenting in July 1999, subsequently had CABG and AVR in 2005, hypertension, history of aortic stenosis, non-insulin-dependent diabetes mellitus, controlled by diet, chronic progressive renal insufficiency, stage IV, hypercholesteremia, and history of nonsustained VT and SVT in the past.  The patient came to the ER from lab as he had episode of dizziness associated with feeling generalized weak, short of breath.  The patient denies any chest pain, nausea, vomiting, diaphoresis.  Complains of progressive increasing leg swelling and dyspnea with minimal exertion.  The patient was seen in my office few days ago and was noted to be in mild heart failure and was treated with increasing dose of Lasix with minimal improvement.  The patient denies any palpitation or syncopal episode.  The patient does give history of PND, orthopnea, and leg swelling.  The patient also complains of cough with whitish milky mucus, but denies any fever or chills.  PAST MEDICAL HISTORY:  As above.  PAST SURGICAL HISTORY:  He had CABG and AVR in the past.  Had femoral arterial stenting of right leg in the past.  PHYSICAL EXAMINATION:  VITAL SIGNS:  His  blood pressure was 189/92, pulse was 73, he was afebrile. HEENT:  Conjunctiva was pink.  Sclerae nonicteric. NECK:  Supple.  Positive for JVD. HEART:  Regular rate and rhythm.  There is 2/6 systolic murmur and soft diastolic murmur and S3 gallop noted. LUNGS:  Decreased breath sounds at bases with bilateral rhonchi and rales. ABDOMEN:  Soft.  Bowel sounds were present.  Nontender. EXTREMITIES:  There was no clubbing, cyanosis.  There was 2+ edema.  LABORATORY:  Sodium was 139, potassium 3.9, BUN  37, creatinine 2.60. His 3 sets of cardiac enzymes were normal.  His proBNP was 2752, repeat proBNP is 1743, today his proBNP is 1633 which is trending down.  His hemoglobin was 12.2, hematocrit 37.6, white count of 8.4.  Nuclear stress test which was done yesterday showed no evidence of ischemia with global hypokinesia, EF of 42%.  BRIEF HOSPITAL COURSE:  The patient was admitted to telemetry unit and was started on IV Lasix and continued on his home medication with good diuresis.  MI was ruled out by serial enzymes and EKG.  The patient subsequently underwent nuclear stress test, i.e. Lexiscan Myoview which showed no evidence of reversible ischemia with global hypokinesia, EF of 42%.  The patient was also treated with Rocephin for bronchitis with improvement in his coughing.  The patient remained afebrile during the hospital stay.  The patient clinically has markedly improved with almost resolution of his leg swelling and appears to be fully compensated from heart failure point of view.  The patient has been advised to monitor his weight regularly and stick with low-salt, low-cholesterol/renal diet.  The patient will be followed up closely in my office in 1 week. The patient also has been advised to refrain from taking any NSAIDs or Ace inhibitors for now.  We will follow his renal function as outpatient closely.  He will be followed by Renal Service also as outpatient.     Eduardo Osier. Sharyn Lull, M.D.     MNH/MEDQ  D:  04/05/2012  T:  04/06/2012  Job:  [id number]

## 2012-04-11 ENCOUNTER — Other Ambulatory Visit: Payer: Self-pay | Admitting: Cardiology

## 2012-04-11 DIAGNOSIS — N179 Acute kidney failure, unspecified: Secondary | ICD-10-CM

## 2012-04-12 ENCOUNTER — Encounter (INDEPENDENT_AMBULATORY_CARE_PROVIDER_SITE_OTHER): Payer: Medicare Other

## 2012-04-12 DIAGNOSIS — N179 Acute kidney failure, unspecified: Secondary | ICD-10-CM

## 2012-04-12 DIAGNOSIS — N184 Chronic kidney disease, stage 4 (severe): Secondary | ICD-10-CM

## 2012-09-03 ENCOUNTER — Encounter (HOSPITAL_COMMUNITY): Payer: Self-pay | Admitting: *Deleted

## 2012-09-03 ENCOUNTER — Emergency Department (HOSPITAL_COMMUNITY)
Admission: EM | Admit: 2012-09-03 | Discharge: 2012-09-03 | Disposition: A | Discharge status: Home / Self Care | Payer: Medicare Other | Attending: Emergency Medicine | Admitting: Emergency Medicine

## 2012-09-03 DIAGNOSIS — I509 Heart failure, unspecified: Secondary | ICD-10-CM | POA: Diagnosis not present

## 2012-09-03 DIAGNOSIS — R059 Cough, unspecified: Secondary | ICD-10-CM | POA: Insufficient documentation

## 2012-09-03 DIAGNOSIS — I10 Essential (primary) hypertension: Secondary | ICD-10-CM | POA: Diagnosis not present

## 2012-09-03 DIAGNOSIS — N19 Unspecified kidney failure: Secondary | ICD-10-CM | POA: Diagnosis not present

## 2012-09-03 DIAGNOSIS — Z9889 Other specified postprocedural states: Secondary | ICD-10-CM | POA: Diagnosis not present

## 2012-09-03 DIAGNOSIS — F17200 Nicotine dependence, unspecified, uncomplicated: Secondary | ICD-10-CM | POA: Diagnosis not present

## 2012-09-03 DIAGNOSIS — R05 Cough: Secondary | ICD-10-CM | POA: Insufficient documentation

## 2012-09-03 DIAGNOSIS — J3489 Other specified disorders of nose and nasal sinuses: Secondary | ICD-10-CM | POA: Insufficient documentation

## 2012-09-03 DIAGNOSIS — Z954 Presence of other heart-valve replacement: Secondary | ICD-10-CM | POA: Insufficient documentation

## 2012-09-03 DIAGNOSIS — R21 Rash and other nonspecific skin eruption: Secondary | ICD-10-CM | POA: Diagnosis present

## 2012-09-03 DIAGNOSIS — Z7982 Long term (current) use of aspirin: Secondary | ICD-10-CM | POA: Insufficient documentation

## 2012-09-03 DIAGNOSIS — Z79899 Other long term (current) drug therapy: Secondary | ICD-10-CM | POA: Diagnosis not present

## 2012-09-03 DIAGNOSIS — L0100 Impetigo, unspecified: Secondary | ICD-10-CM | POA: Insufficient documentation

## 2012-09-03 MED ORDER — MUPIROCIN CALCIUM 2 % EX CREA: TOPICAL_CREAM | Freq: Three times a day (TID) | CUTANEOUS | Status: DC

## 2012-09-03 MED ORDER — AMOXICILLIN-POT CLAVULANATE 875-125 MG PO TABS: 1.0000 | ORAL_TABLET | Freq: Two times a day (BID) | ORAL | Status: DC

## 2012-09-03 NOTE — ED Notes (Addendum)
Per pt:  Pt woke up around 0430 with nasal drainage and scabs present around his nose and lip.  Pt st's it itches badly but has no pain.  No issues breathing.  Pt in nad.

## 2012-09-03 NOTE — ED Provider Notes (Signed)
Medical screening examination/treatment/procedure(s) were conducted as a shared visit with non-physician practitioner(s) and myself.  I personally evaluated the patient during the encounter.  Pt with acute onset of blisters, lesions to upper lip and nose associated with recent URI.  Suspect impetigo, will treat with mupiricin and oral abx.  Olivia Mackie, MD 09/03/12 [phone]

## 2012-09-03 NOTE — ED Provider Notes (Signed)
History     CSN: [id number]  Arrival date & time 09/03/12  0554   First MD Initiated Contact with Patient 09/03/12 [phone]      No chief complaint on file.   (Consider location/radiation/quality/duration/timing/severity/associated sxs/prior treatment) HPI Comments: WALKER SITAR is a 78 y.o. Male who presents with complaint of pain and rash over his nose and upper lip. States woke up this morning with this new symptom. Reports itching and burning, especially with palpation. Nothing makes his symptoms better. States has had nasal congestion for the last week, denies blowing his nose frequently. Denies fever, chills, malaise. Has not tried any treatment.  No other complaints.    Past Medical History  Diagnosis Date  . Hypertension   . Renal disorder     renal failure  . CHF (congestive heart failure)     Past Surgical History  Procedure Date  . Cardiac surgery   . Cardiac valve surgery   . Femoral artery stent     right leg per pt    No family history on file.  History  Substance Use Topics  . Smoking status: Current Some Day Smoker -- 0.2 packs/day for 15 years  . Smokeless tobacco: Never Used  . Alcohol Use: No      Review of Systems  Constitutional: Negative for fever and chills.  HENT: Positive for congestion and rhinorrhea. Negative for ear pain, nosebleeds, sore throat and sneezing.   Respiratory: Positive for cough. Negative for chest tightness and shortness of breath.   Cardiovascular: Negative for chest pain and leg swelling.  Musculoskeletal: Negative for myalgias.  Skin: Positive for rash.  Neurological: Negative.     Allergies  Review of patient's allergies indicates no known allergies.  Home Medications   Current Outpatient Rx  Name  Route  Sig  Dispense  Refill  . ASPIRIN 81 MG PO TBEC   Oral   Take 1 tablet (81 mg total) by mouth daily.   30 tablet   3   . CARVEDILOL 3.125 MG PO TABS   Oral   Take 1 tablet (3.125 mg total) by mouth  every 12 (twelve) hours.   60 tablet   3   . FUROSEMIDE 40 MG PO TABS   Oral   Take 2 tablets (80 mg total) by mouth 2 (two) times daily.   60 tablet   3   . ISOSORB DINITRATE-HYDRALAZINE 20-37.5 MG PO TABS   Oral   Take 1 tablet by mouth 3 (three) times daily.         Marland Kitchen POTASSIUM CHLORIDE CRYS ER 10 MEQ PO TBCR   Oral   Take 1 tablet (10 mEq total) by mouth daily.   30 tablet   3     BP 186/96  Pulse 87  Temp 97.9 F (36.6 C) (Oral)  Resp 20  SpO2 96%  Physical Exam  Nursing note and vitals reviewed. Constitutional: He is oriented to person, place, and time. He appears well-developed and well-nourished. No distress.  HENT:  Head: Normocephalic and atraumatic.  Right Ear: Tympanic membrane, external ear and ear canal normal.  Left Ear: Tympanic membrane, external ear and ear canal normal.  Nose: Rhinorrhea present. No epistaxis.  No foreign bodies. Right sinus exhibits no maxillary sinus tenderness and no frontal sinus tenderness. Left sinus exhibits no maxillary sinus tenderness and no frontal sinus tenderness.    Cardiovascular: Normal rate, regular rhythm and normal heart sounds.   Pulmonary/Chest: Effort normal and breath sounds normal.  No respiratory distress. He has no wheezes. He has no rales.  Neurological: He is alert and oriented to person, place, and time.  Skin: Skin is warm and dry.    ED Course  Procedures (including critical care time)  Rash consistent with impetigo. Pt is non toxic appearing. Afebrile.  Filed Vitals:   09/03/12 0603  BP: 186/96  Pulse: 87  Temp: 97.9 F (36.6 C)  Resp: 20    will recheck. Discussed with dr. Norlene Campbell. Will start on both topical and oral antibiotic. Follow up with PCP. Return precautions given if worsening quickly, high fever, or any other concerns.   6:30 AM BP repeated, elevated. Pt has not taken his BP meds this morning. Pt asymptomatic for his elevated BP. Instructed to take his medications as soon as he  gets home and have a close follow up with PCP.   1. Impetigo       MDM          Lottie Mussel, PA 09/03/12 1610  Lottie Mussel, PA 09/03/12 0630

## 2013-03-01 ENCOUNTER — Other Ambulatory Visit: Payer: Self-pay

## 2013-03-01 ENCOUNTER — Inpatient Hospital Stay (HOSPITAL_COMMUNITY)
Admission: EM | Admit: 2013-03-01 | Discharge: 2013-03-09 | DRG: 291 | Disposition: A | Discharge status: Home Health | Payer: Medicare Other | Attending: Cardiovascular Disease | Admitting: Cardiovascular Disease

## 2013-03-01 ENCOUNTER — Encounter (HOSPITAL_COMMUNITY): Payer: Self-pay | Admitting: *Deleted

## 2013-03-01 ENCOUNTER — Emergency Department (HOSPITAL_COMMUNITY): Payer: Medicare Other

## 2013-03-01 DIAGNOSIS — N19 Unspecified kidney failure: Secondary | ICD-10-CM

## 2013-03-01 DIAGNOSIS — I509 Heart failure, unspecified: Secondary | ICD-10-CM | POA: Diagnosis present

## 2013-03-01 DIAGNOSIS — Z951 Presence of aortocoronary bypass graft: Secondary | ICD-10-CM

## 2013-03-01 DIAGNOSIS — N184 Chronic kidney disease, stage 4 (severe): Secondary | ICD-10-CM | POA: Diagnosis present

## 2013-03-01 DIAGNOSIS — E875 Hyperkalemia: Secondary | ICD-10-CM

## 2013-03-01 DIAGNOSIS — J40 Bronchitis, not specified as acute or chronic: Secondary | ICD-10-CM | POA: Diagnosis present

## 2013-03-01 DIAGNOSIS — I252 Old myocardial infarction: Secondary | ICD-10-CM

## 2013-03-01 DIAGNOSIS — I2510 Atherosclerotic heart disease of native coronary artery without angina pectoris: Secondary | ICD-10-CM | POA: Diagnosis present

## 2013-03-01 DIAGNOSIS — I5023 Acute on chronic systolic (congestive) heart failure: Principal | ICD-10-CM | POA: Diagnosis present

## 2013-03-01 DIAGNOSIS — I4729 Other ventricular tachycardia: Secondary | ICD-10-CM | POA: Diagnosis present

## 2013-03-01 DIAGNOSIS — F17200 Nicotine dependence, unspecified, uncomplicated: Secondary | ICD-10-CM | POA: Diagnosis present

## 2013-03-01 DIAGNOSIS — N189 Chronic kidney disease, unspecified: Secondary | ICD-10-CM | POA: Diagnosis present

## 2013-03-01 DIAGNOSIS — F0390 Unspecified dementia without behavioral disturbance: Secondary | ICD-10-CM | POA: Diagnosis present

## 2013-03-01 DIAGNOSIS — I472 Ventricular tachycardia, unspecified: Secondary | ICD-10-CM | POA: Diagnosis present

## 2013-03-01 DIAGNOSIS — I999 Unspecified disorder of circulatory system: Secondary | ICD-10-CM | POA: Diagnosis present

## 2013-03-01 DIAGNOSIS — I130 Hypertensive heart and chronic kidney disease with heart failure and stage 1 through stage 4 chronic kidney disease, or unspecified chronic kidney disease: Secondary | ICD-10-CM | POA: Diagnosis present

## 2013-03-01 DIAGNOSIS — I359 Nonrheumatic aortic valve disorder, unspecified: Secondary | ICD-10-CM | POA: Diagnosis present

## 2013-03-01 DIAGNOSIS — E119 Type 2 diabetes mellitus without complications: Secondary | ICD-10-CM | POA: Diagnosis present

## 2013-03-01 DIAGNOSIS — J189 Pneumonia, unspecified organism: Secondary | ICD-10-CM | POA: Diagnosis present

## 2013-03-01 DIAGNOSIS — E7800 Pure hypercholesterolemia, unspecified: Secondary | ICD-10-CM | POA: Diagnosis present

## 2013-03-01 MED ORDER — NITROGLYCERIN 2 % TD OINT
0.5000 [in_us] | TOPICAL_OINTMENT | Freq: Once | TRANSDERMAL | Status: AC
  Administered 2013-03-02: 0.5 [in_us] via TOPICAL
  Filled 2013-03-01: qty 30

## 2013-03-01 NOTE — ED Notes (Signed)
Pt started with a productive cough 4 days ago. Today called EMS complaining of dizziness. History of CHF

## 2013-03-01 NOTE — ED Notes (Signed)
[id number]<WG> Expected date:<BR> Expected time:<BR> Means of arrival:<BR> Comments:<BR> EMS, 17M CHF

## 2013-03-01 NOTE — ED Provider Notes (Signed)
History     CSN: [id number]  Arrival date & time 03/01/13  2309   First MD Initiated Contact with Patient 03/01/13 2316      Chief Complaint  Patient presents with  . Dizziness     Patient is a 79 y.o. male presenting with cough. The history is provided by the patient.  Cough Cough characteristics:  Productive Severity:  Moderate Onset quality:  Gradual Duration:  4 days Timing:  Intermittent Progression:  Worsening Chronicity:  New Relieved by:  Nothing Worsened by:  Nothing tried Associated symptoms: no chest pain, no fever and no headaches   pt reports he has had cough for past 4 days Today he noted his BP to be elevated and he felt dizzy upon standing He felt as though he may faint.  No LOC reported No active CP No Ha No focal weakness   Past Medical History  Diagnosis Date  . Hypertension   . Renal disorder     renal failure  . CHF (congestive heart failure)     Past Surgical History  Procedure Laterality Date  . Cardiac surgery    . Cardiac valve surgery    . Femoral artery stent      right leg per pt    History reviewed. No pertinent family history.  History  Substance Use Topics  . Smoking status: Current Some Day Smoker -- 0.25 packs/day for 15 years  . Smokeless tobacco: Never Used  . Alcohol Use: No      Review of Systems  Constitutional: Negative for fever.  Respiratory: Positive for cough.   Cardiovascular: Negative for chest pain.  Gastrointestinal: Negative for vomiting, diarrhea and blood in stool.  Musculoskeletal: Negative for back pain.  Neurological: Positive for dizziness and weakness. Negative for headaches.  All other systems reviewed and are negative.    Allergies  Review of patient's allergies indicates no known allergies.  Home Medications   Current Outpatient Rx  Name  Route  Sig  Dispense  Refill  . amoxicillin-clavulanate (AUGMENTIN) 875-125 MG per tablet   Oral   Take 1 tablet by mouth 2 (two) times daily.   10 tablet   0   . carvedilol (COREG) 3.125 MG tablet   Oral   Take 1 tablet (3.125 mg total) by mouth every 12 (twelve) hours.   60 tablet   3   . furosemide (LASIX) 40 MG tablet   Oral   Take 2 tablets (80 mg total) by mouth 2 (two) times daily.   60 tablet   3   . isosorbide-hydrALAZINE (BIDIL) 20-37.5 MG per tablet   Oral   Take 1 tablet by mouth 3 (three) times daily.         . mupirocin cream (BACTROBAN) 2 %   Topical   Apply topically 3 (three) times daily.   15 g   0   . potassium chloride (K-DUR,KLOR-CON) 10 MEQ tablet   Oral   Take 10 mEq by mouth daily.           BP 139/86  Pulse 75  Temp(Src) 97.6 F (36.4 C) (Oral)  Resp 24  SpO2 98%  Physical Exam CONSTITUTIONAL: Well developed/well nourished HEAD: Normocephalic/atraumatic EYES: EOMI/PERRL, no nystagmus ENMT: Mucous membranes moist NECK: supple no meningeal signs SPINE:entire spine nontender CV: S1/S2 noted, murmur noted LUNGS: crackles in left base.  Mild tachypnea noted ABDOMEN: soft, nontender, no rebound or guarding GU:no cva tenderness NEURO:Awake/alert, facies symmetric, no arm or leg drift is noted  No past pointing EXTREMITIES: pulses normal, full ROM.  SKIN: warm, color normal PSYCH: no abnormalities of mood noted    ED Course  Procedures Labs Reviewed  CBC WITH DIFFERENTIAL  BASIC METABOLIC PANEL  PRO B NATRIURETIC PEPTIDE  12:08 AM Pt with cough for 4 days now presenting with fatigue and near syncope.  He also reports his BP was elevated Took his BP meds and then soon after felt dizzy  1:41 AM chf noted Hyperkalemia noted He has been given NTG, kayexalate and lasix D/w dr Algie Coffer, covering for harwani Will admit Patient agreeable  MDM  Nursing notes including past medical history and social history reviewed and considered in documentation xrays reviewed and considered Labs/vital reviewed and considered        Date: 03/01/2013  Rate: 68  Rhythm: normal  sinus rhythm  QRS Axis: left  Intervals: normal  ST/T Wave abnormalities: nonspecific ST changes  Conduction Disutrbances:none  Narrative Interpretation:   Old EKG Reviewed: unchanged    Joya Gaskins, MD 03/02/13 [phone]

## 2013-03-02 ENCOUNTER — Other Ambulatory Visit: Payer: Self-pay

## 2013-03-02 ENCOUNTER — Encounter (HOSPITAL_COMMUNITY): Payer: Self-pay | Admitting: Urology

## 2013-03-02 LAB — BASIC METABOLIC PANEL
BUN: 33 mg/dL — ABNORMAL HIGH (ref 6–23)
CO2: 21 mEq/L (ref 19–32)
Calcium: 8.4 mg/dL (ref 8.4–10.5)
Chloride: 112 mEq/L (ref 96–112)
Creatinine, Ser: 3.33 mg/dL — ABNORMAL HIGH (ref 0.50–1.35)
Glucose, Bld: 114 mg/dL — ABNORMAL HIGH (ref 70–99)

## 2013-03-02 LAB — CBC WITH DIFFERENTIAL/PLATELET
Eosinophils Absolute: 0.1 10*3/uL (ref 0.0–0.7)
Eosinophils Relative: 1 % (ref 0–5)
HCT: 35.6 % — ABNORMAL LOW (ref 39.0–52.0)
Lymphocytes Relative: 20 % (ref 12–46)
Lymphs Abs: 1.4 10*3/uL (ref 0.7–4.0)
MCH: 29.3 pg (ref 26.0–34.0)
MCV: 89.2 fL (ref 78.0–100.0)
Monocytes Absolute: 0.6 10*3/uL (ref 0.1–1.0)
RBC: 3.99 MIL/uL — ABNORMAL LOW (ref 4.22–5.81)
WBC: 6.9 10*3/uL (ref 4.0–10.5)

## 2013-03-02 LAB — PRO B NATRIURETIC PEPTIDE: Pro B Natriuretic peptide (BNP): 3815 pg/mL — ABNORMAL HIGH (ref 0–450)

## 2013-03-02 LAB — POCT I-STAT TROPONIN I: Troponin i, poc: 0.08 ng/mL (ref 0.00–0.08)

## 2013-03-02 MED ORDER — ASPIRIN 325 MG PO TABS
325.0000 mg | ORAL_TABLET | Freq: Every day | ORAL | Status: DC
  Administered 2013-03-02 – 2013-03-09 (×8): 325 mg via ORAL
  Filled 2013-03-02 (×9): qty 1

## 2013-03-02 MED ORDER — ISOSORB DINITRATE-HYDRALAZINE 20-37.5 MG PO TABS
1.0000 | ORAL_TABLET | Freq: Two times a day (BID) | ORAL | Status: DC
  Administered 2013-03-02 – 2013-03-09 (×15): 1 via ORAL
  Filled 2013-03-02 (×16): qty 1

## 2013-03-02 MED ORDER — HEPARIN SODIUM (PORCINE) 5000 UNIT/ML IJ SOLN
5000.0000 [IU] | Freq: Three times a day (TID) | INTRAMUSCULAR | Status: DC
  Administered 2013-03-02 – 2013-03-09 (×22): 5000 [IU] via SUBCUTANEOUS
  Filled 2013-03-02 (×25): qty 1

## 2013-03-02 MED ORDER — ISOSORB DINITRATE-HYDRALAZINE 20-37.5 MG PO TABS: 1.0000 | ORAL_TABLET | Freq: Two times a day (BID) | ORAL | Status: DC

## 2013-03-02 MED ORDER — ACETAMINOPHEN 325 MG PO TABS: 650.0000 mg | ORAL_TABLET | ORAL | Status: DC | PRN

## 2013-03-02 MED ORDER — CARVEDILOL 3.125 MG PO TABS
3.1250 mg | ORAL_TABLET | Freq: Two times a day (BID) | ORAL | Status: DC
  Filled 2013-03-02 (×4): qty 1

## 2013-03-02 MED ORDER — SODIUM CHLORIDE 0.9 % IV SOLN: 250.0000 mL | INTRAVENOUS | Status: DC | PRN

## 2013-03-02 MED ORDER — FUROSEMIDE 10 MG/ML IJ SOLN
80.0000 mg | Freq: Two times a day (BID) | INTRAMUSCULAR | Status: DC
  Administered 2013-03-02 – 2013-03-06 (×10): 80 mg via INTRAVENOUS
  Filled 2013-03-02 (×12): qty 8

## 2013-03-02 MED ORDER — SODIUM CHLORIDE 0.9 % IJ SOLN
3.0000 mL | Freq: Two times a day (BID) | INTRAMUSCULAR | Status: DC
  Administered 2013-03-02 – 2013-03-08 (×13): 3 mL via INTRAVENOUS

## 2013-03-02 MED ORDER — SODIUM POLYSTYRENE SULFONATE 15 GM/60ML PO SUSP
15.0000 g | Freq: Once | ORAL | Status: AC
  Administered 2013-03-02: 15 g via ORAL
  Filled 2013-03-02: qty 60

## 2013-03-02 MED ORDER — ONDANSETRON HCL 4 MG/2ML IJ SOLN: 4.0000 mg | Freq: Four times a day (QID) | INTRAMUSCULAR | Status: DC | PRN

## 2013-03-02 MED ORDER — CARVEDILOL 3.125 MG PO TABS
3.1250 mg | ORAL_TABLET | Freq: Two times a day (BID) | ORAL | Status: DC
  Administered 2013-03-02 – 2013-03-09 (×16): 3.125 mg via ORAL
  Filled 2013-03-02 (×19): qty 1

## 2013-03-02 MED ORDER — FUROSEMIDE 10 MG/ML IJ SOLN
40.0000 mg | Freq: Once | INTRAMUSCULAR | Status: AC
  Administered 2013-03-02: 40 mg via INTRAVENOUS
  Filled 2013-03-02: qty 4

## 2013-03-02 MED ORDER — SODIUM CHLORIDE 0.9 % IJ SOLN
3.0000 mL | INTRAMUSCULAR | Status: DC | PRN
  Administered 2013-03-05 – 2013-03-07 (×2): 3 mL via INTRAVENOUS

## 2013-03-02 NOTE — H&P (Signed)
[name] [name] is an 79 y.o. male.   Chief Complaint: Shortness of breath and dizziness HPI: 79 years old male with 4 day history of cough, leg edema and shortness of beath. Patient has history of CHF and renal insufficiency.  Past Medical History  Diagnosis Date  . Hypertension   . Renal disorder     renal failure  . CHF (congestive heart failure)       Past Surgical History  Procedure Laterality Date  . Cardiac surgery    . Cardiac valve surgery    . Femoral artery stent      right leg per pt    History reviewed. No pertinent family history. Social History:  reports that he has been smoking.  He has never used smokeless tobacco. He reports that he does not drink alcohol or use illicit drugs.  Allergies: No Known Allergies   (Not in a hospital admission)  Results for orders placed during the hospital encounter of 03/01/13 (from the past 48 hour(s))  CBC WITH DIFFERENTIAL     Status: Abnormal   Collection Time    03/02/13 12:01 AM      Result Value Range   WBC 6.9  4.0 - 10.5 K/uL   RBC 3.99 (*) 4.22 - 5.81 MIL/uL   Hemoglobin 11.7 (*) 13.0 - 17.0 g/dL   HCT 24.4 (*) 01.0 - 27.2 %   MCV 89.2  78.0 - 100.0 fL   MCH 29.3  26.0 - 34.0 pg   MCHC 32.9  30.0 - 36.0 g/dL   RDW 53.6 (*) 64.4 - 03.4 %   Platelets 128 (*) 150 - 400 K/uL   Comment: REPEATED TO VERIFY     SPECIMEN CHECKED FOR CLOTS   Neutrophils Relative 70  43 - 77 %   Neutro Abs 4.8  1.7 - 7.7 K/uL   Lymphocytes Relative 20  12 - 46 %   Lymphs Abs 1.4  0.7 - 4.0 K/uL   Monocytes Relative 8  3 - 12 %   Monocytes Absolute 0.6  0.1 - 1.0 K/uL   Eosinophils Relative 1  0 - 5 %   Eosinophils Absolute 0.1  0.0 - 0.7 K/uL   Basophils Relative 0  0 - 1 %   Basophils Absolute 0.0  0.0 - 0.1 K/uL   Smear Review MORPHOLOGY UNREMARKABLE    BASIC METABOLIC PANEL     Status: Abnormal   Collection Time    03/02/13 12:01 AM      Result Value Range   Sodium 143  135 - 145 mEq/L   Potassium 6.0 (*) 3.5 - 5.1  mEq/L   Comment: HEMOLYZED SPECIMEN, RESULTS MAY BE AFFECTED     HEMOLYSIS AT THIS LEVEL MAY AFFECT RESULT   Chloride 112  96 - 112 mEq/L   CO2 21  19 - 32 mEq/L   Glucose, Bld 114 (*) 70 - 99 mg/dL   BUN 33 (*) 6 - 23 mg/dL   Creatinine, Ser 7.42 (*) 0.50 - 1.35 mg/dL   Calcium 8.4  8.4 - 59.5 mg/dL   GFR calc non Af Amer 16 (*) >90 mL/min   GFR calc Af Amer 19 (*) >90 mL/min   Comment:            The eGFR has been calculated     using the CKD EPI equation.     This calculation has not been     validated in all clinical     situations.  eGFR's persistently     <90 mL/min signify     possible Chronic Kidney Disease.  PRO B NATRIURETIC PEPTIDE     Status: Abnormal   Collection Time    03/02/13 12:01 AM      Result Value Range   Pro B Natriuretic peptide (BNP) 3815.0 (*) 0 - 450 pg/mL  POCT I-STAT TROPONIN I     Status: None   Collection Time    03/02/13 12:12 AM      Result Value Range   Troponin i, poc 0.08  0.00 - 0.08 ng/mL   Comment 3            Comment: Due to the release kinetics of cTnI,     a negative result within the first hours     of the onset of symptoms does not rule out     myocardial infarction with certainty.     If myocardial infarction is still suspected,     repeat the test at appropriate intervals.   Dg Chest Portable 1 View  03/02/2013  *RADIOLOGY REPORT*  Clinical Data: Cough.  Hypertension.  PORTABLE CHEST - 1 VIEW  Comparison: PA and lateral chest 04/03/2012.  Findings: There is marked cardiomegaly.  Pulmonary edema is identified.  There is a small left pleural effusion versus pleural scar, unchanged.  No pneumothorax.  IMPRESSION: Cardiomegaly and pulmonary edema.   Original Report Authenticated By: Holley Dexter, M.D.     @ROS @ Constitutional: Negative for fever and chills.  Respiratory: Positive for cough and shortness of breath. Negative for hemoptysis and wheezing.  Cardiovascular: Positive for orthopnea, leg swelling and PND. Negative  for chest pain and palpitations.  Gastrointestinal: Negative for nausea, vomiting and abdominal pain.  Musculoskeletal: Negative for myalgias.  Neurological: Positive for dizziness and weakness.   Physical Exam  Blood pressure 139/86, pulse 75, temperature 97.6 F (36.4 C), temperature source Oral, resp. rate 24, SpO2 98.00%. Constitutional: He is oriented to person, place, and time.  HENT: Head: Normocephalic and atraumatic. Eyes: Brown, conjunctivae are normal. No scleral icterus.  Neck: Normal range of motion. Neck supple. JVD present.  Cardiovascular: Normal rate and regular rhythm. 2/6 systolic murmur and soft diastolic murmur noted. Soft S3 gallop noted.  Respiratory: Decreased breath sound at bases with bilateral rhonchi and rales noted  GI: Soft. Bowel sounds are normal. He exhibits no distension. There is no tenderness.  Musculoskeletal: No clubbing or cyanosis. 2+ edema noted  Lymphadenopathy: He has no cervical adenopathy.  Neurological: He is alert and oriented to person, place, and time. Moves all 4 extremities.   Assessment/Plan Decompensated congestive systolic heart failure Coronary artery disease history of MI in the past rule out ischemia rule out MI  History of aortic stenosis  Status post CABG and aVR  Hypertension  Non-insulin-dependent diabetes mellitus controlled by diet  Chronic kidney disease stage IV  Hypercholesteremia  History of nonsustained VT and SVT in the past  Bronchitis  IV lasix, home medications.  Chima Astorino S 03/02/2013, 2:12 AM

## 2013-03-02 NOTE — Progress Notes (Signed)
MD aware Pt's BP 166/119. Pulse 74. No other changes in Pt's assessment. New orders given.

## 2013-03-02 NOTE — Progress Notes (Signed)
  Echocardiogram 2D Echocardiogram has been performed.  Saria Haran 03/02/2013, 8:20 AM

## 2013-03-03 LAB — BASIC METABOLIC PANEL
BUN: 41 mg/dL — ABNORMAL HIGH (ref 6–23)
CO2: 22 mEq/L (ref 19–32)
Chloride: 103 mEq/L (ref 96–112)
GFR calc Af Amer: 22 mL/min — ABNORMAL LOW (ref >90)
Potassium: 4.2 mEq/L (ref 3.5–5.1)

## 2013-03-03 LAB — MAGNESIUM: Magnesium: 2 mg/dL (ref 1.5–2.5)

## 2013-03-03 MED ORDER — AMLODIPINE BESYLATE 2.5 MG PO TABS
2.5000 mg | ORAL_TABLET | Freq: Every day | ORAL | Status: DC
  Administered 2013-03-03 – 2013-03-08 (×6): 2.5 mg via ORAL
  Filled 2013-03-03 (×6): qty 1

## 2013-03-03 MED ORDER — LORAZEPAM 2 MG/ML IJ SOLN
1.0000 mg | Freq: Once | INTRAMUSCULAR | Status: AC
  Administered 2013-03-03: 1 mg via INTRAVENOUS
  Filled 2013-03-03: qty 1

## 2013-03-03 MED ORDER — GUAIFENESIN-CODEINE 100-10 MG/5ML PO SOLN: 5.0000 mL | ORAL | Status: DC | PRN

## 2013-03-03 NOTE — Progress Notes (Signed)
MD notified about 9 beats of V-tach. Orders to add MAG to morning labs.  Earnest Conroy. Clelia Croft, RN

## 2013-03-03 NOTE — Progress Notes (Signed)
Pt had a 14 beat run of Vtach. Pt asymptomatic, resting in bed. VSS. MD made aware, no new orders at this time. Will continue to monitor pt closely.

## 2013-03-03 NOTE — Progress Notes (Signed)
Subjective:  Feeling better. Some cough.  Objective:  Vital Signs in the last 24 hours: Temp:  [97.4 F (36.3 C)-97.9 F (36.6 C)] 97.4 F (36.3 C) (05/11 0300) Pulse Rate:  [67-74] 72 (05/11 0300) Cardiac Rhythm:  [-] Normal sinus rhythm (05/11 0748) Resp:  [20] 20 (05/11 0300) BP: (137-177)/(75-119) 150/94 mmHg (05/11 0631) SpO2:  [96 %-99 %] 99 % (05/11 0300) Weight:  [74.617 kg (164 lb 8 oz)] 74.617 kg (164 lb 8 oz) (05/11 0300)  Physical Exam: BP Readings from Last 1 Encounters:  03/03/13 150/94     Wt Readings from Last 1 Encounters:  03/03/13 74.617 kg (164 lb 8 oz)    Weight change: -3.311 kg (-7 lb 4.8 oz)  HEENT: [place]/AT, Eyes-Brown, PERL, EOMI, Conjunctiva-Pink, Sclera-Non-icteric Neck: No JVD, No bruit, Trachea midline. Lungs:  Few basal crackles, Bilateral. Cardiac:  Regular rhythm, normal S1 and S2, no S3. II/VI systolic murmur. Abdomen:  Soft, non-tender. Extremities:  1 + edema present. No cyanosis. No clubbing. CNS: AxOx3, Cranial nerves grossly intact, moves all 4 extremities. Right handed. Skin: Warm and dry.   Intake/Output from previous day: 05/10 0701 - 05/11 0700 In: 840 [P.O.:840] Out: 1550 [Urine:1550]    Lab Results: BMET    Component Value Date/Time   NA 139 03/03/2013 0459   K 4.2 03/03/2013 0459   CL 103 03/03/2013 0459   CO2 22 03/03/2013 0459   GLUCOSE 96 03/03/2013 0459   BUN 41* 03/03/2013 0459   CREATININE 2.99* 03/03/2013 0459   CALCIUM 8.9 03/03/2013 0459   GFRNONAA 19* 03/03/2013 0459   GFRAA 22* 03/03/2013 0459   CBC    Component Value Date/Time   WBC 6.9 03/02/2013 0001   RBC 3.99* 03/02/2013 0001   HGB 11.7* 03/02/2013 0001   HCT 35.6* 03/02/2013 0001   PLT 128* 03/02/2013 0001   MCV 89.2 03/02/2013 0001   MCH 29.3 03/02/2013 0001   MCHC 32.9 03/02/2013 0001   RDW 16.7* 03/02/2013 0001   LYMPHSABS 1.4 03/02/2013 0001   MONOABS 0.6 03/02/2013 0001   EOSABS 0.1 03/02/2013 0001   BASOSABS 0.0 03/02/2013 0001   CARDIAC ENZYMES Lab  Results  Component Value Date   CKTOTAL 130 03/31/2012   CKMB 3.2 03/31/2012   TROPONINI <0.30 03/31/2012    Scheduled Meds: . aspirin  325 mg Oral Daily  . carvedilol  3.125 mg Oral BID WC  . furosemide  80 mg Intravenous Q12H  . heparin  5,000 Units Subcutaneous Q8H  . isosorbide-hydrALAZINE  1 tablet Oral BID  . sodium chloride  3 mL Intravenous Q12H   Continuous Infusions:  PRN Meds:.sodium chloride, acetaminophen, ondansetron (ZOFRAN) IV, sodium chloride  Assessment/Plan: Decompensated congestive systolic heart failure  Coronary artery disease history of MI in the past rule out ischemia rule out MI  History of aortic stenosis  Status post CABG and aVR  Hypertension  Non-insulin-dependent diabetes mellitus controlled by diet  Chronic kidney disease stage IV  Hypercholesteremia  History of nonsustained VT and SVT in the past  Bronchitis  Continue lasix. Add robitussin with codeine for cough.   LOS: 2 days    Orpah Cobb  MD  03/03/2013, 9:52 AM

## 2013-03-04 LAB — BASIC METABOLIC PANEL
BUN: 44 mg/dL — ABNORMAL HIGH (ref 6–23)
Chloride: 102 mEq/L (ref 96–112)
GFR calc Af Amer: 20 mL/min — ABNORMAL LOW (ref >90)
Potassium: 3.5 mEq/L (ref 3.5–5.1)

## 2013-03-04 LAB — PRO B NATRIURETIC PEPTIDE: Pro B Natriuretic peptide (BNP): 2245 pg/mL — ABNORMAL HIGH (ref 0–450)

## 2013-03-04 MED ORDER — LEVOFLOXACIN IN D5W 500 MG/100ML IV SOLN
500.0000 mg | INTRAVENOUS | Status: DC
  Administered 2013-03-06 – 2013-03-08 (×2): 500 mg via INTRAVENOUS
  Filled 2013-03-04 (×2): qty 100

## 2013-03-04 MED ORDER — LEVOFLOXACIN IN D5W 750 MG/150ML IV SOLN
750.0000 mg | Freq: Once | INTRAVENOUS | Status: AC
  Administered 2013-03-04: 750 mg via INTRAVENOUS
  Filled 2013-03-04: qty 150

## 2013-03-04 NOTE — Progress Notes (Signed)
ANTIBIOTIC CONSULT NOTE - INITIAL  Pharmacy Consult for Levaquin Indication: Bronchitis vs PNA  No Known Allergies  Patient Measurements: Height: 5\' 9"  (175.3 cm) Weight: 160 lb 7.9 oz (72.8 kg) IBW/kg (Calculated) : 70.7  Vital Signs: Temp: 97.2 F (36.2 C) (05/12 0656) Temp src: Axillary (05/12 0656) BP: 167/89 mmHg (05/12 0656) Pulse Rate: 65 (05/12 0656)  Labs:  Recent Labs  03/02/13 0001 03/03/13 0459 03/04/13 0420  WBC 6.9  --   --   HGB 11.7*  --   --   PLT 128*  --   --   CREATININE 3.33* 2.99* 3.16*   Estimated Creatinine Clearance: 19.3 ml/min (by C-G formula based on Cr of 3.16).   Medical History: Past Medical History  Diagnosis Date  . Hypertension   . Renal disorder     renal failure  . CHF (congestive heart failure)     Medications:  Anti-infectives   None     Assessment: 79 yo M admitted 03/02/13 with CC: SOB and dizziness. Plan now is to start Levaquin for bronchitis vs PNA. Patient has not received antibiotics during this admission yet. Hx of CKD IV, current CrCl ~ 54ml/min. Will renally adjust Levaquin dose.  Plan:  1)  Levaquin 750mg  IV x1, then 2)  Levaquin 500mg  IV q48h  Darrol Angel, PharmD Pager: [phone] 03/04/2013,12:46 PM

## 2013-03-04 NOTE — Progress Notes (Signed)
Subjective:  Patient denies any chest pain or shortness of breath states cough is improving slowly. Denies any palpitations  Objective:  Vital Signs in the last 24 hours: Temp:  [96.9 F (36.1 C)-97.4 F (36.3 C)] 97.2 F (36.2 C) (05/12 0656) Pulse Rate:  [65-82] 65 (05/12 0656) Resp:  [19-22] 20 (05/12 0656) BP: (135-176)/(88-97) 167/89 mmHg (05/12 0656) SpO2:  [99 %-100 %] 99 % (05/11 2117) Weight:  [72.8 kg (160 lb 7.9 oz)] 72.8 kg (160 lb 7.9 oz) (05/12 0500)  Intake/Output from previous day: 05/11 0701 - 05/12 0700 In: 240 [P.O.:240] Out: 1825 [Urine:1825] Intake/Output from this shift: Total I/O In: -  Out: 200 [Urine:200]  Physical Exam: Neck: no adenopathy, no carotid bruit, no JVD and supple, symmetrical, trachea midline Lungs: Decreased breath sound at bases with occasional rhonchi Heart: regular rate and rhythm, S1, S2 normal and 2/6 systolic and soft diastolic murmur noted Abdomen: soft, non-tender; bowel sounds normal; no masses,  no organomegaly Extremities: No clubbing cyanosis trace edema noted  Lab Results:  Recent Labs  03/02/13 0001  WBC 6.9  HGB 11.7*  PLT 128*    Recent Labs  03/03/13 0459 03/04/13 0420  NA 139 138  K 4.2 3.5  CL 103 102  CO2 22 27  GLUCOSE 96 96  BUN 41* 44*  CREATININE 2.99* 3.16*   No results found for this basename: TROPONINI, CK, MB,  in the last 72 hours Hepatic Function Panel No results found for this basename: PROT, ALBUMIN, AST, ALT, ALKPHOS, BILITOT, BILIDIR, IBILI,  in the last 72 hours No results found for this basename: CHOL,  in the last 72 hours No results found for this basename: PROTIME,  in the last 72 hours  Imaging: Imaging results have been reviewed and No results found.  Cardiac Studies:  Assessment/Plan:  Resolving congestive heart failure secondary to preserved LV systolic function Coronary artery disease history of MI in the past rule out ischemia rule out MI  History of aortic stenosis   Status post CABG and aVR  Hypertension  Non-insulin-dependent diabetes mellitus controlled by diet  Chronic kidney disease stage IV  Hypercholesteremia  History of nonsustained VT and SVT in the past  Bronchitis rule out pneumonia Plan as per orders  LOS: 3 days    Amie Cowens N 03/04/2013, 12:17 PM

## 2013-03-04 NOTE — Progress Notes (Signed)
6 Beats of VT noted Vs taken. Pt sitting up in chair eating a snack with no complaints.   Dr. Algie Coffer called and informed.

## 2013-03-05 LAB — BASIC METABOLIC PANEL
Chloride: 102 mEq/L (ref 96–112)
GFR calc Af Amer: 17 mL/min — ABNORMAL LOW (ref >90)
Potassium: 3.8 mEq/L (ref 3.5–5.1)

## 2013-03-05 NOTE — Progress Notes (Signed)
Subjective:  Feeling better. Renal dysfunction continues. T max 98.2 F  Objective:  Vital Signs in the last 24 hours: Temp:  [97.3 F (36.3 C)-98.2 F (36.8 C)] 97.3 F (36.3 C) (05/13 1357) Pulse Rate:  [77-90] 88 (05/13 1607) Cardiac Rhythm:  [-] Normal sinus rhythm (05/13 0750) Resp:  [18-20] 18 (05/13 1357) BP: (98-123)/(51-81) 118/62 mmHg (05/13 1607) SpO2:  [95 %-98 %] 98 % (05/13 1357) Weight:  [72.7 kg (160 lb 4.4 oz)] 72.7 kg (160 lb 4.4 oz) (05/13 0450)  Physical Exam: BP Readings from Last 1 Encounters:  03/05/13 118/62     Wt Readings from Last 1 Encounters:  03/05/13 72.7 kg (160 lb 4.4 oz)    Weight change: -0.1 kg (-3.5 oz)  HEENT: [place]/AT, Eyes-Brown, PERL, EOMI, Conjunctiva-Pink, Sclera-Non-icteric Neck: No JVD, No bruit, Trachea midline. Lungs:  Clearing, Bilateral. Cardiac:  Regular rhythm, normal S1 and S2, no S3. II/VI systolic murmur. Abdomen:  Soft, non-tender. Extremities:  Trace edema present. No cyanosis. No clubbing. CNS: AxOx3, Cranial nerves grossly intact, moves all 4 extremities. Right handed. Skin: Warm and dry.   Intake/Output from previous day: 05/12 0701 - 05/13 0700 In: 750 [P.O.:600; IV Piggyback:150] Out: 1100 [Urine:1100]    Lab Results: BMET    Component Value Date/Time   NA 138 03/05/2013 0453   K 3.8 03/05/2013 0453   CL 102 03/05/2013 0453   CO2 25 03/05/2013 0453   GLUCOSE 95 03/05/2013 0453   BUN 54* 03/05/2013 0453   CREATININE 3.61* 03/05/2013 0453   CALCIUM 8.7 03/05/2013 0453   GFRNONAA 15* 03/05/2013 0453   GFRAA 17* 03/05/2013 0453   CBC    Component Value Date/Time   WBC 6.9 03/02/2013 0001   RBC 3.99* 03/02/2013 0001   HGB 11.7* 03/02/2013 0001   HCT 35.6* 03/02/2013 0001   PLT 128* 03/02/2013 0001   MCV 89.2 03/02/2013 0001   MCH 29.3 03/02/2013 0001   MCHC 32.9 03/02/2013 0001   RDW 16.7* 03/02/2013 0001   LYMPHSABS 1.4 03/02/2013 0001   MONOABS 0.6 03/02/2013 0001   EOSABS 0.1 03/02/2013 0001   BASOSABS 0.0  03/02/2013 0001   CARDIAC ENZYMES Lab Results  Component Value Date   CKTOTAL 130 03/31/2012   CKMB 3.2 03/31/2012   TROPONINI <0.30 03/31/2012    Scheduled Meds: . amLODipine  2.5 mg Oral Daily  . aspirin  325 mg Oral Daily  . carvedilol  3.125 mg Oral BID WC  . furosemide  80 mg Intravenous Q12H  . heparin  5,000 Units Subcutaneous Q8H  . isosorbide-hydrALAZINE  1 tablet Oral BID  . [START ON 03/06/2013] levofloxacin (LEVAQUIN) IV  500 mg Intravenous Q48H  . sodium chloride  3 mL Intravenous Q12H   Continuous Infusions:  PRN Meds:.sodium chloride, acetaminophen, guaiFENesin-codeine, ondansetron (ZOFRAN) IV, sodium chloride  Assessment/Plan: Decompensated congestive systolic heart failure  Coronary artery disease history of MI in the past rule out ischemia rule out MI  History of aortic stenosis  Status post CABG and aVR  Hypertension  Non-insulin-dependent diabetes mellitus controlled by diet  Chronic kidney disease stage IV  Hypercholesteremia  History of nonsustained VT and SVT in the past  Bronchitis  Increase activity. Home soon/Dr. Sharyn Lull    LOS: 4 days    Orpah Cobb  MD  03/05/2013, 5:14 PM

## 2013-03-06 MED ORDER — FUROSEMIDE 40 MG PO TABS
40.0000 mg | ORAL_TABLET | Freq: Two times a day (BID) | ORAL | Status: DC
  Administered 2013-03-06 – 2013-03-07 (×2): 40 mg via ORAL
  Filled 2013-03-06 (×4): qty 1

## 2013-03-06 NOTE — Plan of Care (Signed)
Problem: Phase I Progression Outcomes Goal: EF % per last Echo/documented,Core Reminder form on chart Outcome: Completed/Met Date Met:  03/06/13 2D-Echo on 03/02/13. EF= 50-55%

## 2013-03-06 NOTE — Progress Notes (Signed)
Patient had 4 and 5 beats of v.tach early this morning. Patient is resting on bed and asymptomatic. Vital signs is stable. Will continue to monitor patient.

## 2013-03-06 NOTE — Progress Notes (Signed)
CSW received consult for SNF placement - CSW spoke with patient & nephew at bedside re: discharge planning, patient plans to return home with family at discharge. CSW signing off.   Unice Bailey, LCSW [facility] Clinical Social Worker cell #: [phone]

## 2013-03-06 NOTE — Progress Notes (Signed)
Subjective:  Patient denies any chest pain states cough and breathing is improved  Objective:  Vital Signs in the last 24 hours: Temp:  [97.7 F (36.5 C)-98.1 F (36.7 C)] 97.7 F (36.5 C) (05/14 1343) Pulse Rate:  [80-85] 85 (05/14 1343) Resp:  [18] 18 (05/14 1343) BP: (108-124)/(69-79) 108/69 mmHg (05/14 1343) SpO2:  [99 %-100 %] 100 % (05/14 1343) Weight:  [71.986 kg (158 lb 11.2 oz)] 71.986 kg (158 lb 11.2 oz) (05/14 0522)  Intake/Output from previous day: 05/13 0701 - 05/14 0700 In: 840 [P.O.:840] Out: 1400 [Urine:1400] Intake/Output from this shift:    Physical Exam: Neck: no adenopathy, no carotid bruit, no JVD and supple, symmetrical, trachea midline Lungs: Decreased breath sound at bases with occasional rhonchi Heart: regular rate and rhythm, S1, S2 normal and Soft systolic murmur and S3 gallop noted Abdomen: soft, non-tender; bowel sounds normal; no masses,  no organomegaly Extremities: extremities normal, atraumatic, no cyanosis or edema  Lab Results: No results found for this basename: WBC, HGB, PLT,  in the last 72 hours  Recent Labs  03/04/13 0420 03/05/13 0453  NA 138 138  K 3.5 3.8  CL 102 102  CO2 27 25  GLUCOSE 96 95  BUN 44* 54*  CREATININE 3.16* 3.61*   No results found for this basename: TROPONINI, CK, MB,  in the last 72 hours Hepatic Function Panel No results found for this basename: PROT, ALBUMIN, AST, ALT, ALKPHOS, BILITOT, BILIDIR, IBILI,  in the last 72 hours No results found for this basename: CHOL,  in the last 72 hours No results found for this basename: PROTIME,  in the last 72 hours  Imaging: Imaging results have been reviewed and No results found.  Cardiac Studies:  Assessment/Plan:  Resolving congestive heart failure secondary to preserved LV systolic function  Coronary artery disease history of MI in the past rule out ischemia rule out MI  History of aortic stenosis  Status post CABG and aVR  Hypertension   Non-insulin-dependent diabetes mellitus controlled by diet  Chronic kidney disease stage IV  Hypercholesteremia  History of nonsustained VT and SVT in the past  Bronchitis rule out pneumonia Plan OT PT consult Check labs in a.m.  LOS: 5 days    Akera Snowberger N 03/06/2013, 7:39 PM

## 2013-03-07 LAB — BASIC METABOLIC PANEL
BUN: 63 mg/dL — ABNORMAL HIGH (ref 6–23)
Chloride: 98 mEq/L (ref 96–112)
Glucose, Bld: 120 mg/dL — ABNORMAL HIGH (ref 70–99)
Potassium: 4.2 mEq/L (ref 3.5–5.1)

## 2013-03-07 LAB — CBC
HCT: 38 % — ABNORMAL LOW (ref 39.0–52.0)
Hemoglobin: 12.6 g/dL — ABNORMAL LOW (ref 13.0–17.0)
WBC: 7.6 10*3/uL (ref 4.0–10.5)

## 2013-03-07 MED ORDER — FUROSEMIDE 40 MG PO TABS
40.0000 mg | ORAL_TABLET | Freq: Every day | ORAL | Status: DC
  Administered 2013-03-08: 40 mg via ORAL
  Filled 2013-03-07: qty 1

## 2013-03-07 MED ORDER — MEMANTINE HCL 5 MG PO TABS
5.0000 mg | ORAL_TABLET | Freq: Every day | ORAL | Status: DC
  Administered 2013-03-07 – 2013-03-09 (×3): 5 mg via ORAL
  Filled 2013-03-07 (×3): qty 1

## 2013-03-07 NOTE — Evaluation (Signed)
Physical Therapy Evaluation Patient Details Name: [name] [name] MRN: [id number] DOB: 07-06-1933 Today's Date: 03/07/2013 Time: 1050-1110 PT Time Calculation (min): 20 min  PT Assessment / Plan / Recommendation Clinical Impression  79 yo male admitted 03/01/13 for cough, SOB,mleg edema ,decompensated CHF. Pt ambulatory today x 800 ' . Pt lives alone. friends/family. Pt is independent with ambulation in hall. Sats 89% after walking with recovery to 94 %. HR 67. Pt does not require further PT in hospital. PT will sign off.    PT Assessment  Patent does not need any further PT services    Follow Up Recommendations  No PT follow up    Does the patient have the potential to tolerate intense rehabilitation      Barriers to Discharge        Equipment Recommendations  None recommended by PT    Recommendations for Other Services     Frequency      Precautions / Restrictions Precautions Precautions: None   Pertinent Vitals/Pain       Mobility  Transfers Transfers: Sit to Stand;Stand to Sit Sit to Stand: 7: Independent Stand to Sit: 7: Independent Ambulation/Gait Ambulation/Gait Assistance: 7: Independent Ambulation Distance (Feet): 400 Feet (x 2) Assistive device: None Ambulation/Gait Assistance Details: no problems, noted to turn quickly,stop, start, no balance problems. Gait Pattern: Within Functional Limits    Exercises     PT Diagnosis:    PT Problem List:   PT Treatment Interventions:     PT Goals    Visit Information  Last PT Received On: 03/07/13 Assistance Needed: +1    Subjective Data  Subjective: I drag race every weekend and i am 79. Patient Stated Goal: to go home   Prior Functioning  Home Living Lives With: Alone Available Help at Discharge: Friend(s) Type of Home: House Home Access: Stairs to enter Secretary/administrator of Steps: 3 Home Layout: One level Bathroom Shower/Tub: Engineer, manufacturing systems: Standard Home Adaptive  Equipment: None Additional Comments: pt drag races everyweekend, Prior Function Level of Independence: Independent Able to Take Stairs?: Yes Driving: Yes Vocation: Retired Musician: No difficulties    Copywriter, advertising Arousal/Alertness: Awake/alert Behavior During Therapy: WFL for tasks assessed/performed Overall Cognitive Status: Within Functional Limits for tasks assessed    Extremity/Trunk Assessment Right Lower Extremity Assessment RLE ROM/Strength/Tone: [facility] for tasks assessed Left Lower Extremity Assessment LLE ROM/Strength/Tone: [facility] for tasks assessed   Balance    End of Session PT - End of Session Activity Tolerance: Patient tolerated treatment well Patient left:  (up with OT) Nurse Communication: Mobility status  GP     Rada Hay 03/07/2013, 11:29 AM  Blanchard Kelch PT [phone]

## 2013-03-07 NOTE — Evaluation (Signed)
Occupational Therapy Evaluation Patient Details Name: [name] [name] MRN: [id number] DOB: 06-24-1933 Today's Date: 03/07/2013 Time: 4098-1191 OT Time Calculation (min): 8 min  OT Assessment / Plan / Recommendation Clinical Impression  This 79 year old man was admitted with hyperkalemia and CHF.  He is independent with adls and does not need DME.  No further OT is needed at this time.      OT Assessment  Patient does not need any further OT services    Follow Up Recommendations  No OT follow up    Barriers to Discharge      Equipment Recommendations  None recommended by OT    Recommendations for Other Services    Frequency       Precautions / Restrictions Precautions Precautions: None Restrictions Weight Bearing Restrictions: No   Pertinent Vitals/Pain No pain reported.  02 89-90% on RA after PT/OT    ADL  Equipment Used:  (none) Transfers/Ambulation Related to ADLs: pt independent walking in room ADL Comments: independent with adls.  Pt has a tub and demonstrated safe technique of stepping over (simulated).  No LOB during eval.  Pt has a rubbermat in tub.  Discussed energy conservation and pt verbalizes understanding.  Family helps with IADS:  housework and some cooking.      OT Diagnosis:    OT Problem List:   OT Treatment Interventions:     OT Goals    Visit Information  Last OT Received On: 03/07/13 Assistance Needed: +1    Subjective Data  Subjective: When are they going to look at my head (joking) Patient Stated Goal: none stated   Prior Functioning     Home Living Lives With: Alone Available Help at Discharge: Friend(s) Type of Home: House Home Access: Stairs to enter [employer] of Steps: 3 Home Layout: One level Bathroom Shower/Tub: Engineer, manufacturing systems: Standard Home Adaptive Equipment: None Additional Comments: pt drag races everyweekend, Prior Function Level of Independence: Independent Able to Take Stairs?:  Yes Driving: Yes Vocation: Retired Musician: No difficulties         Vision/Perception     Copywriter, advertising Arousal/Alertness: Awake/alert Behavior During Therapy: WFL for tasks assessed/performed Overall Cognitive Status: Within Functional Limits for tasks assessed    Extremity/Trunk Assessment Right Upper Extremity Assessment RUE ROM/Strength/Tone: [facility] for tasks assessed Left Upper Extremity Assessment LUE ROM/Strength/Tone: WFL for tasks assessed     Mobility Transfers Sit to Stand: 7: Independent Stand to Sit: 7: Independent     Exercise     Balance     End of Session OT - End of Session Activity Tolerance: Patient tolerated treatment well Patient left: in chair;with call bell/phone within reach;with family/visitor present  GO     [name] [name] 03/07/2013, 11:31 AM Marica Otter, OTR/L [phone] 03/07/2013

## 2013-03-07 NOTE — Progress Notes (Signed)
ANTIBIOTIC CONSULT NOTE - Follow Up  Pharmacy Consult for Levaquin Indication: Bronchitis vs PNA  No Known Allergies  Patient Measurements: Height: 5\' 9"  (175.3 cm) Weight: 155 lb 6.8 oz (70.5 kg) IBW/kg (Calculated) : 70.7  Vital Signs: Temp: 98.1 F (36.7 C) (05/15 0625) Temp src: Oral (05/15 0625) BP: 137/60 mmHg (05/15 0625) Pulse Rate: 84 (05/15 0625)  Labs:  Recent Labs  03/05/13 0453 03/07/13 0444  WBC  --  7.6  HGB  --  12.6*  PLT  --  164  CREATININE 3.61* 4.49*   Estimated Creatinine Clearance: 13.5 ml/min (by C-G formula based on Cr of 4.49).  Medications:  Anti-infectives   Start     Dose/Rate Route Frequency Ordered Stop   03/06/13 1200  levofloxacin (LEVAQUIN) IVPB 500 mg     500 mg 100 mL/hr over 60 Minutes Intravenous Every 48 hours 03/04/13 1255     03/04/13 1400  levofloxacin (LEVAQUIN) IVPB 750 mg     750 mg 100 mL/hr over 90 Minutes Intravenous  Once 03/04/13 1255 03/04/13 1505     Assessment: 79 yo M admitted 03/02/13 with CC: SOB and dizziness. Plan now is to start Levaquin for bronchitis vs PNA. Hx of CKD IV, current CrCl ~ 40ml/min. Today is Day #4 Levaquin 500mg  IV q48h - dose ok for CrCl 10-19 ml/min.  Chest XRay on 5/9 is not consistent with PNA, does patient still need to be on abx?  Plan:  1)  No changes at this time 2)  Does patient still need to be on antibiotics?  Darrol Angel, PharmD Pager: [phone] 03/07/2013,1:34 PM

## 2013-03-07 NOTE — Progress Notes (Signed)
Met with pt and sister in law(Faye Jacinto Reap) at bedside to discuss d/c needs. Per Ms Jacinto Reap, pt is forgetful and he often forgets to take his medications and meals. She does his grocery shopping and fixes his meals for him. She is requesting HH services to the home for 2 weeks. She wants someone who can stop by pt's home daily to make sure he has had breakfast and taken his meds. I informed her that is not a skilled need hence would not be covered by his insurance. It is a private duty service that she would have to set up herself and pay privately, she verbalized understanding. She stated she already has someone in mind that she can use for that.  Pt would qualify for [facility] for home safety evaluation which would only be like one visit. She would like to use [facility]. Referral has been made but MD needs to place order in EPIC.   Algernon Huxley RN BSN  [phone]

## 2013-03-07 NOTE — Progress Notes (Signed)
Subjective:  Patient denies any chest pain states cough is improved. Frequent episodes of confusion. Renal function progressively deteriorating Lasix dose has been reduced  Objective:  Vital Signs in the last 24 hours: Temp:  [97.1 F (36.2 C)-98.1 F (36.7 C)] 98 F (36.7 C) (05/15 1407) Pulse Rate:  [65-84] 72 (05/15 1407) Resp:  [18] 18 (05/15 1407) BP: (112-137)/(60-78) 112/71 mmHg (05/15 1407) SpO2:  [96 %-100 %] 96 % (05/15 1407) Weight:  [70.5 kg (155 lb 6.8 oz)] 70.5 kg (155 lb 6.8 oz) (05/15 0625)  Intake/Output from previous day: 05/14 0701 - 05/15 0700 In: 700 [P.O.:600; IV Piggyback:100] Out: 1300 [Urine:1300] Intake/Output from this shift: Total I/O In: 480 [P.O.:480] Out: 200 [Urine:200]  Physical Exam: Neck: no adenopathy, no carotid bruit, no JVD and supple, symmetrical, trachea midline Lungs: Decrease breath sounds at bases with occasional rhonchi Heart: regular rate and rhythm, S1, S2 normal and Soft systolic murmur noted Abdomen: soft, non-tender; bowel sounds normal; no masses,  no organomegaly Extremities: extremities normal, atraumatic, no cyanosis or edema  Lab Results:  Recent Labs  03/07/13 0444  WBC 7.6  HGB 12.6*  PLT 164    Recent Labs  03/05/13 0453 03/07/13 0444  NA 138 135  K 3.8 4.2  CL 102 98  CO2 25 25  GLUCOSE 95 120*  BUN 54* 63*  CREATININE 3.61* 4.49*   No results found for this basename: TROPONINI, CK, MB,  in the last 72 hours Hepatic Function Panel No results found for this basename: PROT, ALBUMIN, AST, ALT, ALKPHOS, BILITOT, BILIDIR, IBILI,  in the last 72 hours No results found for this basename: CHOL,  in the last 72 hours No results found for this basename: PROTIME,  in the last 72 hours  Imaging: Imaging results have been reviewed and No results found.  Cardiac Studies:  Assessment/Plan:  Resolving congestive heart failure secondary to preserved LV systolic function  Coronary artery disease history of MI  in the past rule out ischemia rule out MI  History of aortic stenosis  Status post CABG and aVR  Hypertension  Non-insulin-dependent diabetes mellitus controlled by diet  Chronic kidney disease stage IV  Hypercholesteremia  History of nonsustained VT and SVT in the past  Bronchitis  Dementia Plan As per orders Check labs in a.m. Start Namenda as per orders Possible discharge tomorrow if renal function  stable       LOS: 6 days    [name] [name] N 03/07/2013, 5:42 PM

## 2013-03-08 LAB — BASIC METABOLIC PANEL
BUN: 68 mg/dL — ABNORMAL HIGH (ref 6–23)
Chloride: 98 mEq/L (ref 96–112)
GFR calc Af Amer: 12 mL/min — ABNORMAL LOW (ref >90)
Glucose, Bld: 102 mg/dL — ABNORMAL HIGH (ref 70–99)
Potassium: 4.6 mEq/L (ref 3.5–5.1)

## 2013-03-08 MED ORDER — AMIODARONE HCL 200 MG PO TABS
200.0000 mg | ORAL_TABLET | Freq: Two times a day (BID) | ORAL | Status: DC
  Administered 2013-03-08 – 2013-03-09 (×2): 200 mg via ORAL
  Filled 2013-03-08 (×3): qty 1

## 2013-03-08 NOTE — Progress Notes (Signed)
MD notified of runs of v-tach. New orders given. Will continue to monitor.

## 2013-03-08 NOTE — Progress Notes (Signed)
Subjective:  Patient denies any chest pain or shortness of breath. Had multiple episodes of nonsustained VT asymptomatic started on amiodarone  Objective:  Vital Signs in the last 24 hours: Temp:  [98 F (36.7 C)-98.2 F (36.8 C)] 98.2 F (36.8 C) (05/16 1345) Pulse Rate:  [77-79] 79 (05/16 1450) Resp:  [17-18] 17 (05/16 1345) BP: (108-118)/(63-77) 118/69 mmHg (05/16 1450) SpO2:  [100 %] 100 % (05/16 1345) Weight:  [70.2 kg (154 lb 12.2 oz)] 70.2 kg (154 lb 12.2 oz) (05/16 0538)  Intake/Output from previous day: 05/15 0701 - 05/16 0700 In: 720 [P.O.:720] Out: 200 [Urine:200] Intake/Output from this shift:    Physical Exam: Neck: no adenopathy, no carotid bruit, no JVD and supple, symmetrical, trachea midline Lungs: Decreased breath sound at bases Heart: regular rate and rhythm, S1, S2 normal, no murmur, click, rub or gallop Abdomen: soft, non-tender; bowel sounds normal; no masses,  no organomegaly Extremities: extremities normal, atraumatic, no cyanosis or edema  Lab Results:  Recent Labs  03/07/13 0444  WBC 7.6  HGB 12.6*  PLT 164    Recent Labs  03/07/13 0444 03/08/13 0443  NA 135 136  K 4.2 4.6  CL 98 98  CO2 25 27  GLUCOSE 120* 102*  BUN 63* 68*  CREATININE 4.49* 4.71*   No results found for this basename: TROPONINI, CK, MB,  in the last 72 hours Hepatic Function Panel No results found for this basename: PROT, ALBUMIN, AST, ALT, ALKPHOS, BILITOT, BILIDIR, IBILI,  in the last 72 hours No results found for this basename: CHOL,  in the last 72 hours No results found for this basename: PROTIME,  in the last 72 hours  Imaging: Imaging results have been reviewed and No results found.  Cardiac Studies:  Assessment/Plan:  Resolving congestive heart failure secondary to preserved LV systolic function  Coronary artery disease history of MI in past rule out ischemia rule out MI  History of aortic stenosis  Status post CABG and aVR  Status post recurrent  nonsustained VT asymptomatic Hypertension  Non-insulin-dependent diabetes mellitus controlled by diet  Chronic kidney disease stage IV  Hypercholesteremia  History of nonsustained VT and SVT in the past  Bronchitis  Dementia   Plan Start amiodarone as per orders Monitor for next 24 hours if no further V. tach will DC home    LOS: 7 days    [name] [name] N 03/08/2013, 6:10 PM

## 2013-03-08 NOTE — Progress Notes (Signed)
MD notified of runs of V-TACH. Will continue to monitor.

## 2013-03-09 LAB — BASIC METABOLIC PANEL
Calcium: 9.3 mg/dL (ref 8.4–10.5)
GFR calc Af Amer: 13 mL/min — ABNORMAL LOW (ref >90)
GFR calc non Af Amer: 11 mL/min — ABNORMAL LOW (ref >90)
Glucose, Bld: 101 mg/dL — ABNORMAL HIGH (ref 70–99)
Potassium: 4.4 mEq/L (ref 3.5–5.1)
Sodium: 135 mEq/L (ref 135–145)

## 2013-03-09 MED ORDER — LEVOFLOXACIN 250 MG PO TABS: 250.0000 mg | ORAL_TABLET | Freq: Every day | ORAL | Status: DC

## 2013-03-09 MED ORDER — MEMANTINE HCL 5 MG PO TABS: 5.0000 mg | ORAL_TABLET | Freq: Every day | ORAL | Status: AC

## 2013-03-09 MED ORDER — AMIODARONE HCL 200 MG PO TABS: 200.0000 mg | ORAL_TABLET | Freq: Every day | ORAL | Status: DC

## 2013-03-09 NOTE — Progress Notes (Signed)
Patient had 2 episodes of V.Tach tonight, 7 and 5 VTs. Patient was resting on bed and asymptomatic. Will continue to monitor patient.

## 2013-03-09 NOTE — Care Management Note (Signed)
Pt and family member have made other arrangements for home care assistance.

## 2013-03-09 NOTE — Discharge Summary (Signed)
NAMEMarland Kitchen  ELMER, MERWIN NO.:  [id number]  MEDICAL RECORD NO.:  [id number]  LOCATION:  1432                         FACILITY:  [facility]  PHYSICIAN:  Eduardo Osier. Sharyn Lull, M.D. DATE OF BIRTH:  01-Sep-1933  DATE OF ADMISSION:  03/01/2013 DATE OF DISCHARGE:  03/09/2013                              DISCHARGE SUMMARY   ADMITTING DIAGNOSES: 1. Decompensated congestive heart failure. 2. Coronary artery disease, history of myocardial infarction in the     past, history of aortic stenosis status post coronary artery bypass     graft and aortic valve replacement. 3. Hypertension. 4. Non-insulin-dependent diabetes mellitus, controlled by diet. 5. Chronic kidney disease, stage 4. 6. Hypercholesteremia. 7. History of nonsustained ventricular tachycardia and     supraventricular tachycardia in the past. 8. Bronchitis.  DISCHARGE DIAGNOSES: 1. Coronary artery disease, history of myocardial infarction in the     past, history of aortic stenosis status post coronary artery bypass     graft and aortic valve replacement. 2. Hypertension. 3. Non-insulin-dependent diabetes mellitus, controlled by diet. 4. Chronic kidney disease, stage 4. 5. Hypercholesteremia. 6. History of nonsustained ventricular tachycardia and     supraventricular tachycardia in the past. 7. Bronchitis. 8. Dementia  DISCHARGE HOME MEDICATIONS: 1. Amiodarone 200 mg 1 tablet daily. 2. Levaquin 250 mg 1 tablet daily for 5 more days. 3. Namenda 5 mg 1 tablet daily. 4. Carvedilol 3.125 mg 1 tablet twice daily. 5. Lasix 80 mg twice daily as before. 6. Isosorbide and hydralazine 20/37.5 mg 1 tablet 3 times daily. 7. Potassium chloride 10 mEq 1 tablet daily.  DIET:  Low salt, low cholesterol, 1800 calories ADA diet.  ACTIVITY:  As tolerated.  FOLLOWUP:  Follow up with me in 1 week.  Follow up with renal service, as outpatient as scheduled.  CONDITION AT DISCHARGE:  Stable.  BRIEF HISTORY AND HOSPITAL COURSE:   Mr. Jorge is a 79 year old male with past medical history significant for multiple medical problems, i.e., coronary artery disease, history of aortic stenosis status post CABG and AVR in the past, hypertension, chronic kidney disease stage 4, history of nonsustained VT and SVT in the past, was admitted by Dr. Algie Coffer on Mar 02, 2013, because of progressive increasing shortness of breath associated with dizziness and cough for the last 4 days.  The patient was noted to be in mild decompensated systolic heart failure.  PHYSICAL EXAMINATION:  VITAL SIGNS:  Blood pressure was 139/86, pulse was 75.  He was afebrile. HEENT:  Conjunctivae was pink. NECK:  Supple.  Positive JVD. HEART:  He has 2/6 systolic murmur and soft diastolic murmur and soft S3 gallop. LUNG:  Sounds were decreased at the bases with bilateral rhonchi and rales. ABDOMEN:  Soft.  Bowel sounds were present.  Nontender. EXTREMITIES:  There is no clubbing, cyanosis.  There was 2+ edema.  LABORATORY DATA:  His sodium was 143, potassium 6, BUN 33, creatinine 3.33.  BNP was 3815, repeat BNP was 2245.  On May 15, 20143, BNP is 632, which is trending down.  Hemoglobin was 11.7, hematocrit 35.6, white count of 6.9.  On Mar 07, 2013, hemoglobin was 12.6, hematocrit 38, white count of 7.6.  Last electrolytes; sodium is  135, potassium 4.4, BUN 72, creatinine 4.51, which is trending down.  EKG showed normal sinus rhythm with old anteroseptal wall MI.  No acute ischemic changes were noted.  BRIEF HOSPITAL COURSE:  The patient was admitted to telemetry unit.  MI was ruled out.  The patient did not have any episodes of chest pain during the hospital stay.  The patient was started on IV Lasix with good diuresis, which was switched to p.o.  The patient had multiple episodes of nonsustained VT on the monitor, asymptomatic few beats, was started on p.o. amiodarone.  The patient did not have any further episodes of V- tach during the  hospital stay.  The patient was also started on Levaquin for bronchitis with improvement in his cough.  The patient has remained afebrile during the hospital stay.  OT/PT consultation was called.  The patient is ambulating in room.  The patient will be discharged home. Discussed with family regarding skilled nursing facility, but wanted the patient to go home.  The patient will be discharged home on above medications and will be followed up in my office in 1 week.     Eduardo Osier. Sharyn Lull, M.D.     MNH/MEDQ  D:  03/09/2013  T:  03/09/2013  Job:  [id number]

## 2013-03-09 NOTE — Care Management (Signed)
Per pt choice [facility] to provide Heart Failure Home Screen and safety eval. Pt to discharge with sister in -law and other family members assisting in home care. No other needs identified at this time. H/P, facesheet, MD orders faxed to [facility] at [phone]. Confirmation received.    Roxy Manns Rhodes Calvert,RN,BSN [phone]

## 2013-07-23 ENCOUNTER — Other Ambulatory Visit: Payer: Self-pay | Admitting: Vascular Surgery

## 2013-07-23 DIAGNOSIS — I770 Arteriovenous fistula, acquired: Secondary | ICD-10-CM

## 2013-07-23 DIAGNOSIS — N184 Chronic kidney disease, stage 4 (severe): Secondary | ICD-10-CM

## 2013-07-23 DIAGNOSIS — Z01810 Encounter for preprocedural cardiovascular examination: Secondary | ICD-10-CM

## 2013-07-24 ENCOUNTER — Other Ambulatory Visit: Payer: Self-pay | Admitting: *Deleted

## 2013-07-24 DIAGNOSIS — Z01810 Encounter for preprocedural cardiovascular examination: Secondary | ICD-10-CM

## 2013-07-24 DIAGNOSIS — N184 Chronic kidney disease, stage 4 (severe): Secondary | ICD-10-CM

## 2013-08-08 ENCOUNTER — Encounter: Payer: Self-pay | Admitting: Vascular Surgery

## 2013-08-09 ENCOUNTER — Ambulatory Visit (HOSPITAL_COMMUNITY)
Admission: RE | Admit: 2013-08-09 | Discharge: 2013-08-09 | Disposition: A | Discharge status: Home / Self Care | Payer: Medicare Other | Source: Ambulatory Visit | Attending: Vascular Surgery | Admitting: Vascular Surgery

## 2013-08-09 ENCOUNTER — Encounter: Payer: Self-pay | Admitting: Vascular Surgery

## 2013-08-09 ENCOUNTER — Ambulatory Visit (INDEPENDENT_AMBULATORY_CARE_PROVIDER_SITE_OTHER): Payer: Medicare Other | Admitting: Vascular Surgery

## 2013-08-09 VITALS — Ht 69.0 in | Wt 167.0 lb

## 2013-08-09 DIAGNOSIS — Z01810 Encounter for preprocedural cardiovascular examination: Secondary | ICD-10-CM

## 2013-08-09 DIAGNOSIS — N184 Chronic kidney disease, stage 4 (severe): Secondary | ICD-10-CM | POA: Insufficient documentation

## 2013-08-09 DIAGNOSIS — N186 End stage renal disease: Secondary | ICD-10-CM

## 2013-08-09 NOTE — Progress Notes (Signed)
[facility] [place] HISTORY AND PHYSICAL   CC: ESRD Referring Physician: Dr. Eulogio Ditch  History of Present Illness: [name] [name] is a 79 y.o. male with HTN, former smoker, CHF and CKD stage 4 who was referred by Dr. Arrie Aran for consideration of AVF. Pt states that his kidney function is improving somewhat. Per referral note the pt may want to pursue CAPD rather than HD and if can have AVF ONLY as a backup then we should place the fistula.   Past Medical History  Diagnosis Date  . Hypertension   . Renal disorder     renal failure  . CHF (congestive heart failure)     ROS: [x]  Positive   [ ]  Denies    General: [ ]  Weight loss, [ ]  Fever, [ ]  chills Neurologic: [ ]  Dizziness, [ ]  Blackouts, [ ]  Seizure [ ]  Stroke, [ ]  "Mini stroke", [ ]  Slurred speech, [ ]  Temporary blindness; [ ]  weakness in arms or legs, [ ]  Hoarseness Cardiac: [ ]  Chest pain/pressure, [ ]  Shortness of breath at rest [ x] Shortness of breath with exertion, [ ]  Atrial fibrillation or irregular heartbeat Vascular: [ ]  Pain in legs with walking, [ ]  Pain in legs at rest, [ ]  Pain in legs at night,  [ ]  Non-healing ulcer, [ ]  Blood clot in vein/DVT,   Pulmonary: [ ]  Home oxygen, [ ]  Productive cough, [ ]  Coughing up blood, [ ]  Asthma,  [ ]  Wheezing Musculoskeletal:  [ x] Arthritis, [x ] Low back pain, [ ]  Joint pain Hematologic: [ ]  Easy Bruising, [ ]  Anemia; [ ]  Hepatitis Gastrointestinal: [ ]  Blood in stool, [ ]  Gastroesophageal Reflux/heartburn, [ ]  Trouble swallowing Urinary: [x ] chronic Kidney disease, [ ]  on HD - [ ]  MWF or [ ]  TTHS, [ ]  Burning with urination, [ ]  Difficulty urinating Skin: [ ]  Rashes, [ ]  Wounds Psychological: [ ]  Anxiety, [ ]  Depression   Social History History  Substance Use Topics  . Smoking status: Former Smoker -- 0.25 packs/day for 15 years    Types: Cigarettes    Quit date: 08/10/1983  . Smokeless tobacco: Never Used  . Alcohol Use: No    No  Known Allergies  Current Outpatient Prescriptions  Medication Sig Dispense Refill  . amiodarone (PACERONE) 200 MG tablet Take 1 tablet (200 mg total) by mouth daily.  30 tablet  3  . carvedilol (COREG) 3.125 MG tablet Take 1 tablet (3.125 mg total) by mouth every 12 (twelve) hours.  60 tablet  3  . furosemide (LASIX) 40 MG tablet Take 2 tablets (80 mg total) by mouth 2 (two) times daily.  60 tablet  3  . isosorbide-hydrALAZINE (BIDIL) 20-37.5 MG per tablet Take 1 tablet by mouth 3 (three) times daily.      Marland Kitchen levofloxacin (LEVAQUIN) 250 MG tablet Take 1 tablet (250 mg total) by mouth daily.  5 tablet  0  . memantine (NAMENDA) 5 MG tablet Take 1 tablet (5 mg total) by mouth daily.  30 tablet  3  . potassium chloride (K-DUR,KLOR-CON) 10 MEQ tablet Take 10 mEq by mouth daily.       No current facility-administered medications for this visit.    Physical Examination  Filed Vitals:    Body mass index is 24.65 kg/(m^2).  General:  WDWN in NAD Gait: Normal HENT: WNL Eyes: Pupils equal Pulmonary: normal non-labored breathing , without Rales, positive rhonchi on left throughout   Cardiac:  RRR, without  Murmurs No carotid bruits Abdomen: soft, NT, no masses Skin: no rashes, ulcers noted Vascular Exam/Pulses: 2 + radial pulses bilat  Extremities without ischemic changes, no Gangrene , no cellulitis; no open wounds;  Musculoskeletal: no muscle wasting or atrophy  Neurologic: A&O X 3; Appropriate Affect ; SENSATION: normal; MOTOR FUNCTION:  moving all extremities equally. Speech is fluent/normal  Non-Invasive Vascular Imaging: Vein mapping shows small veins throughout both arms, cephalic,basilic and brachial veins are all 2mm or less  ASSESSMENT/Plan: [name] [name] is a 79 y.o. male With stage 4 CKD who may need dialysis in the near future. With the small size of his veins in both UE he is not a fistula candidate. If the pt does need HD he will need a graft placed. We will see him  back as needed for HD access.  Clinic MD: [facility]  Addendum  I have independently interviewed and examined the patient, and I agree with the physician assistant's findings.  I have checked this patient's brachial vein also and those are too small for fistula placement also.  The patient is leaning toward PD cath placement at this point.  Thank you for the consult.  [name], MD [facility] [place] Office: [phone] Pager: [phone]  08/09/2013, 5:52 PM

## 2013-10-16 ENCOUNTER — Encounter: Payer: Self-pay | Admitting: Vascular Surgery

## 2013-10-18 ENCOUNTER — Encounter: Payer: Self-pay | Admitting: Vascular Surgery

## 2013-10-18 ENCOUNTER — Ambulatory Visit (INDEPENDENT_AMBULATORY_CARE_PROVIDER_SITE_OTHER): Payer: Self-pay | Admitting: Vascular Surgery

## 2013-10-18 VITALS — BP 202/100 | HR 68 | Resp 14 | Ht 68.0 in | Wt 170.0 lb

## 2013-10-18 DIAGNOSIS — N184 Chronic kidney disease, stage 4 (severe): Secondary | ICD-10-CM

## 2013-10-18 NOTE — Progress Notes (Signed)
    Established Dialysis Access  [name] [name] is a 79 y.o. (01-17-1933) male who presents for re-evaluation for permanent access.  The patient is undecided on PD cath vs HD cath.  From my prior evaluation, he is not a candidate for a fistula due to inadequate veins including brachial veins.  The patient by report is not nearing HD.  - I discussed with the patient again his only HD option is UA AVG placement, would start with L arm.   - If he decides on HD, we can place the LUA AVG when he is one month prior to starting HD.  [name], MD [facility] [place] Office: [phone] Pager: [phone]  10/18/2013, 10:19 AM

## 2013-10-26 IMAGING — CR DG CHEST 2V
2 series · 2 of 2 positions shown · non-contrast
Comparison: Radiographs 04/01/2011 and 07/03/2008.  CT 09/04/2006.

CLINICAL DATA: Cough, congestion and wheezing.

CHEST - 2 VIEW

[view not recorded (1 of 2)]
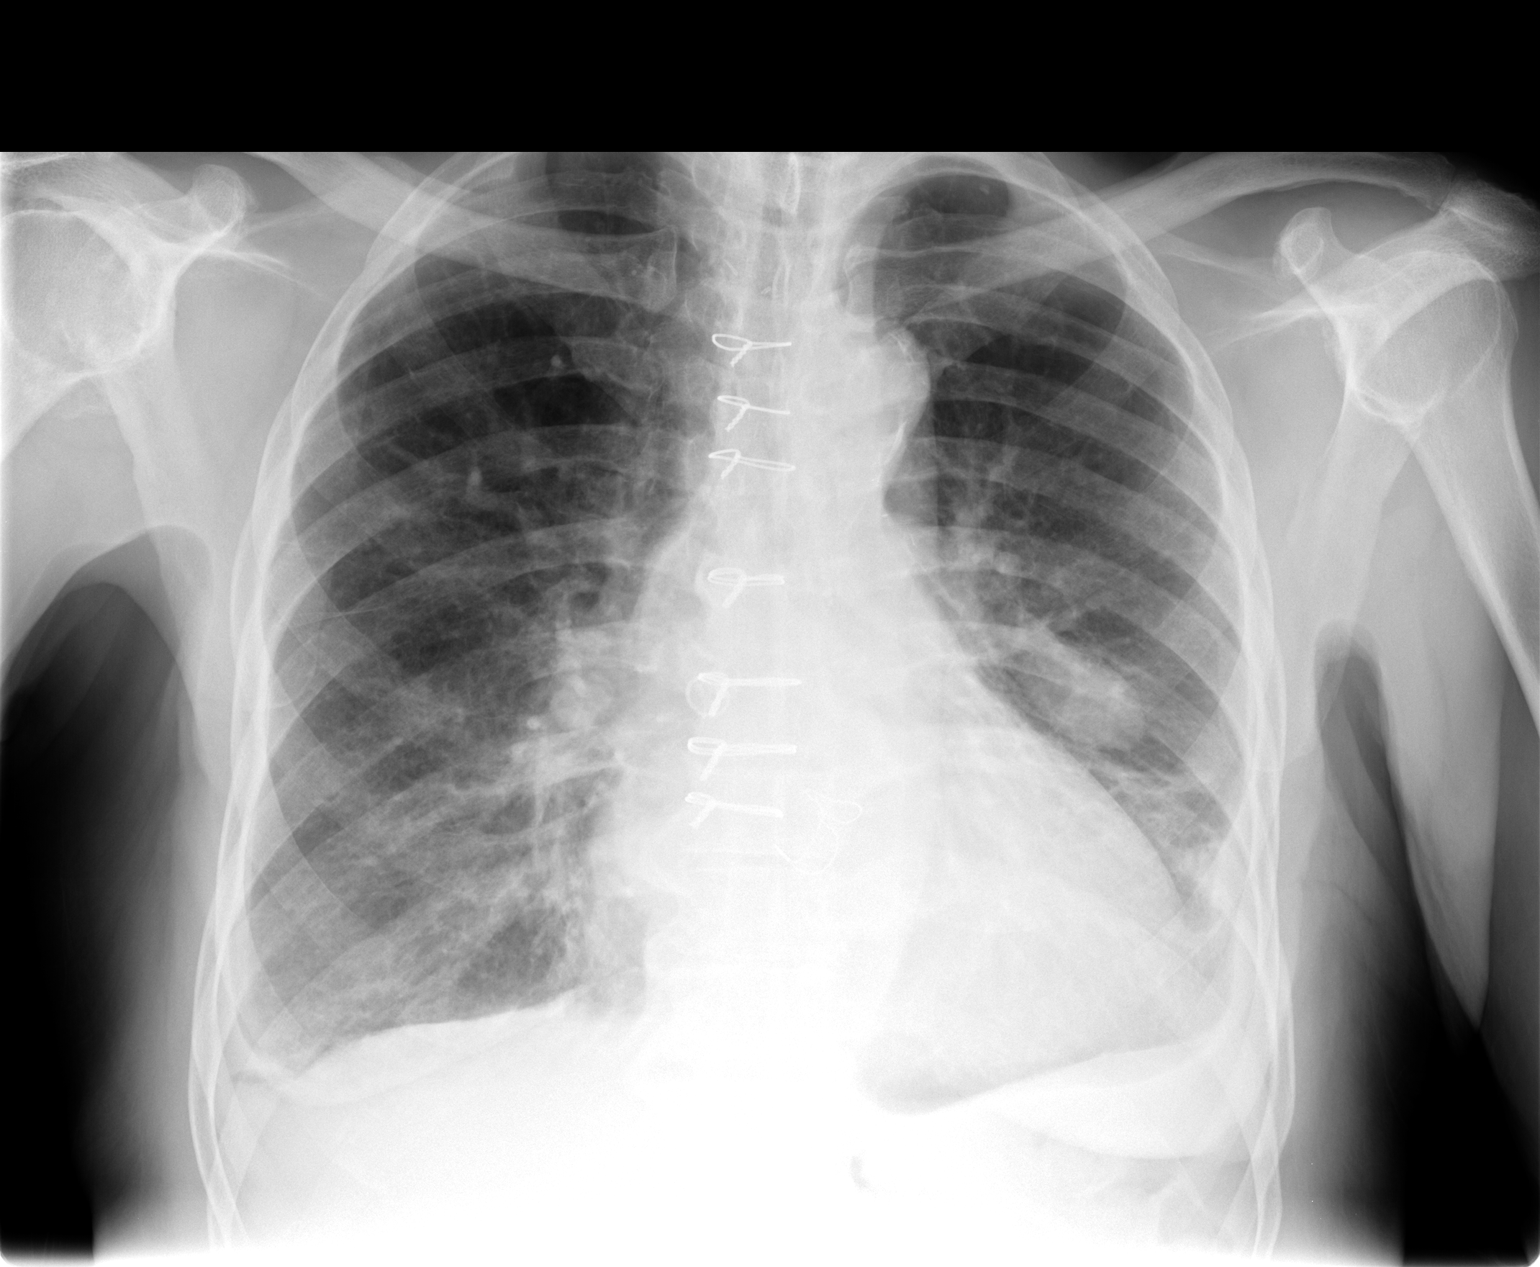

[view not recorded (2 of 2)]
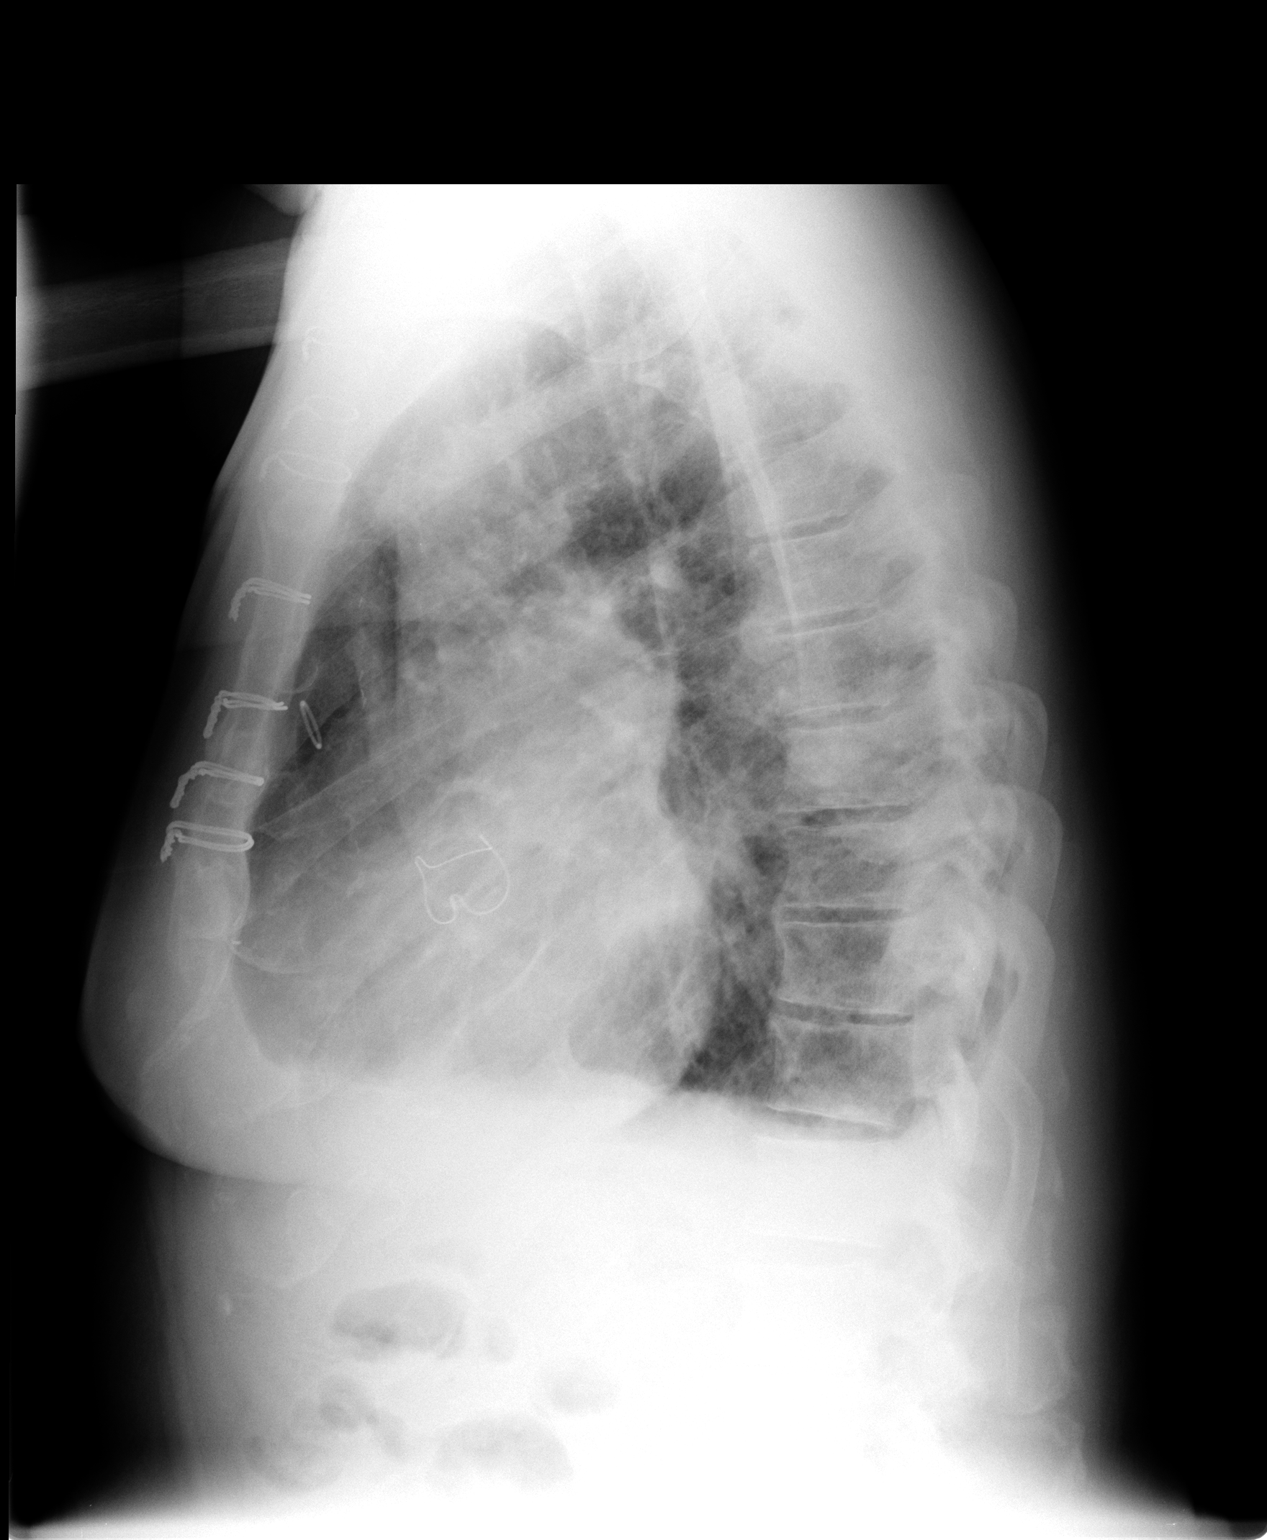

[2 of 2 positions shown; findings below may reference images not displayed]

FINDINGS: The heart size and mediastinal contours are stable status
post CABG and aortic valve replacement.  There is new vascular
congestion with small bilateral pleural effusions and bibasilar
atelectasis. There is underlying chronic pleural thickening on the
left.  An oval density projecting superior to the left heart border
on the frontal examination is not well seen on the lateral view,
although has an appearance suggestive of fluid loculated in the
fissure.
IMPRESSION: 1.  Mild volume overload with pulmonary edema and small bilateral
pleural effusions.
2.  Suspected fluid loculated in the left major fissure. A
pulmonary nodule is difficult to completely exclude and
radiographic followup after clearance of the pleural effusions is
recommended.

## 2013-11-11 IMAGING — CR DG CHEST 1V PORT SAME DAY
1 series · 1 of 1 positions shown · non-contrast
Comparison: Chest x-ray of 03/14/2012 and 04/01/2011

CLINICAL DATA: Dizziness, history of congestive heart failure

PORTABLE CHEST - 1 VIEW SAME DAY

[AP]
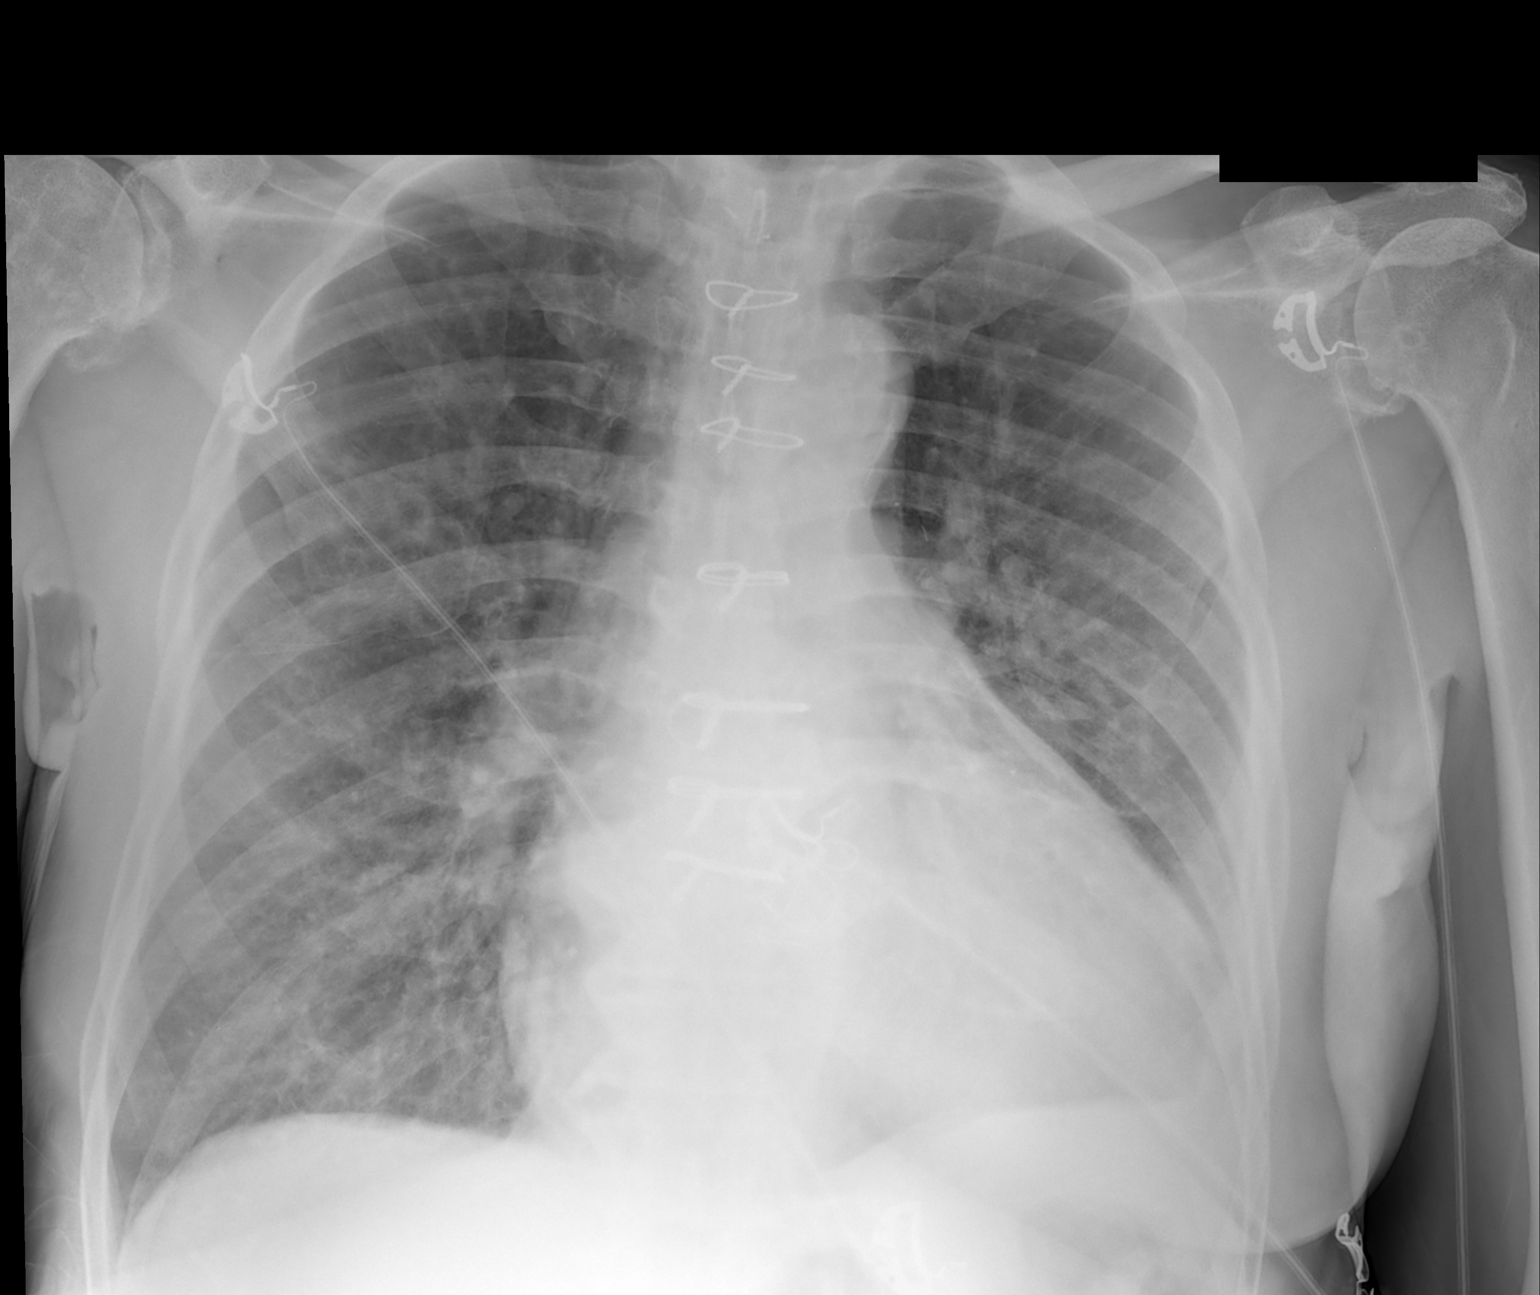

[1 of 1 positions shown; findings below may reference images not displayed]

FINDINGS: There is moderate cardiomegaly present with pulmonary
vascular congestion.  A small left effusion cannot be excluded.
Median sternotomy sutures are noted from prior CABG.
IMPRESSION: Cardiomegaly and moderate pulmonary vascular congestion.

## 2013-11-20 NOTE — Progress Notes (Signed)
[name] [name] PT [phone]  

## 2014-10-13 IMAGING — CR DG CHEST 1V PORT
1 series · 1 of 1 positions shown · non-contrast
Comparison: PA and lateral chest 04/03/2012.

CLINICAL DATA: Cough.  Hypertension.

PORTABLE CHEST - 1 VIEW

[AP]
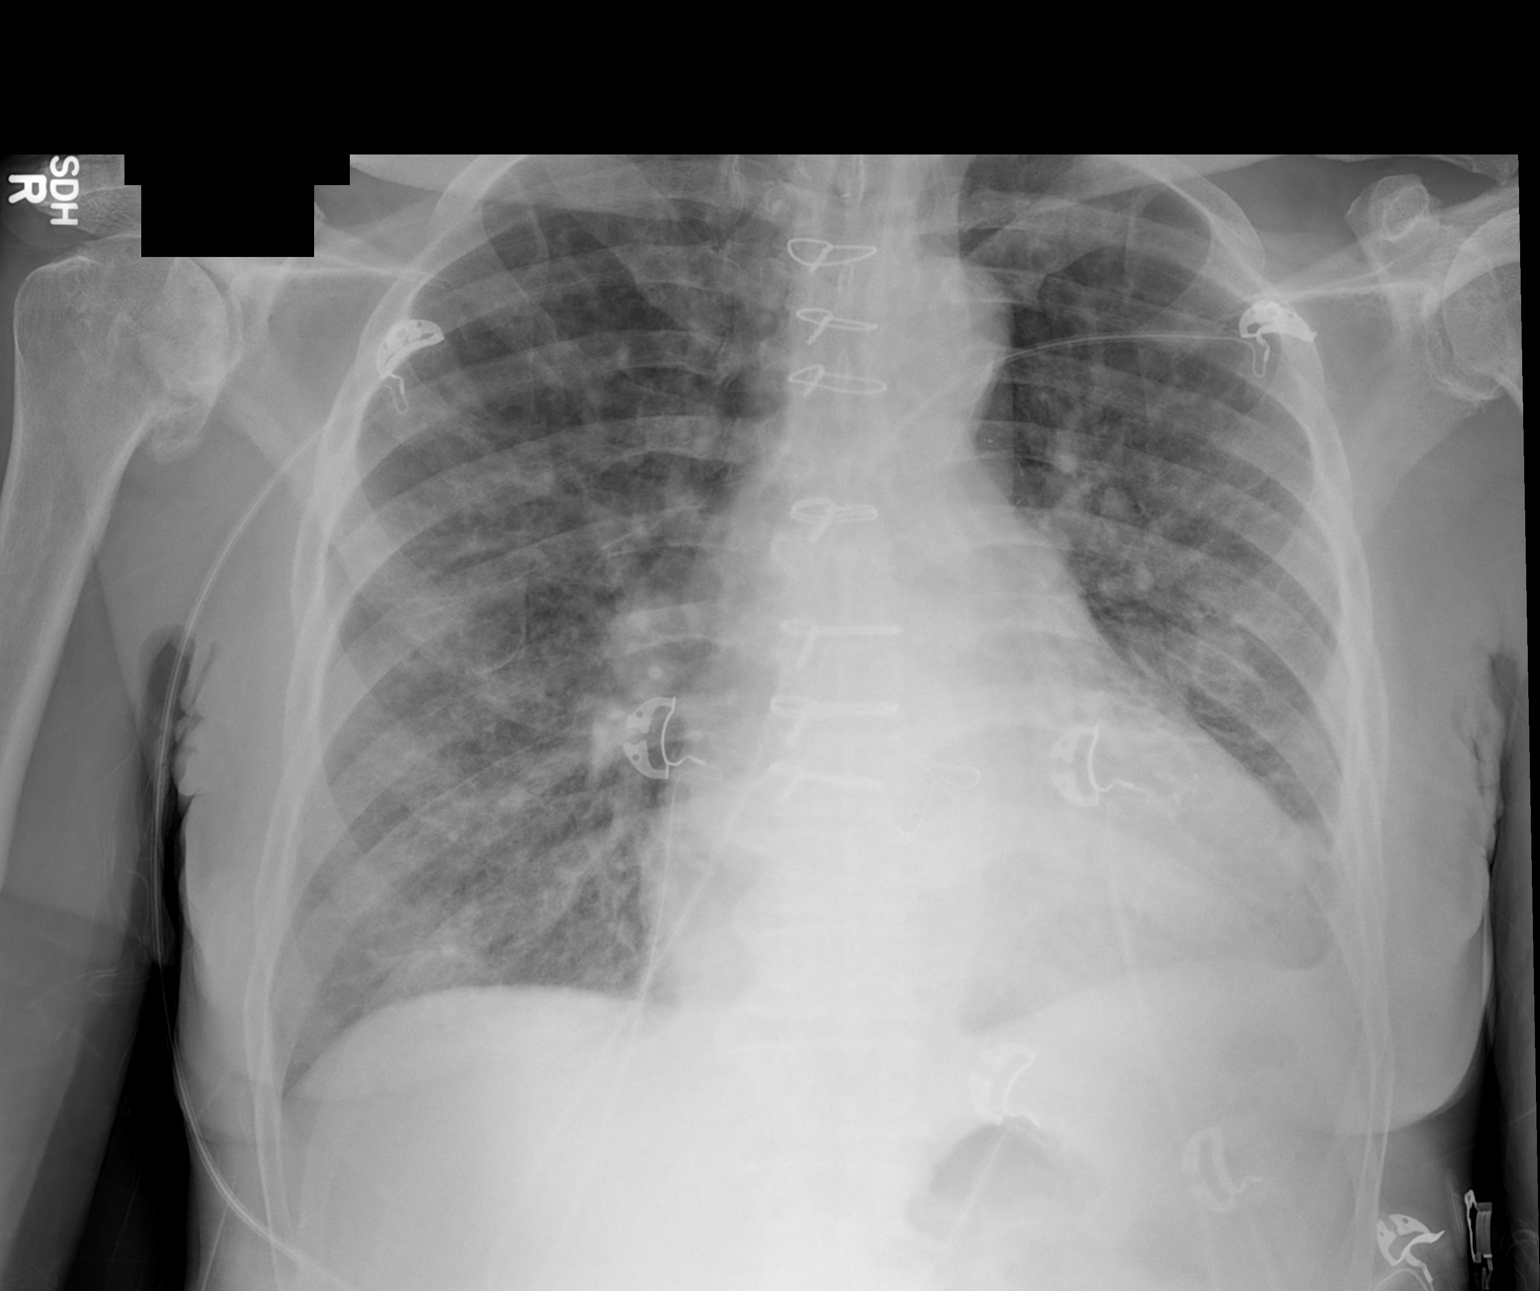

[1 of 1 positions shown; findings below may reference images not displayed]

FINDINGS: There is marked cardiomegaly.  Pulmonary edema is
identified.  There is a small left pleural effusion versus pleural
scar, unchanged.  No pneumothorax.
IMPRESSION: Cardiomegaly and pulmonary edema.

## 2015-03-16 ENCOUNTER — Encounter: Payer: Self-pay | Admitting: Vascular Surgery

## 2015-03-17 ENCOUNTER — Encounter: Payer: Self-pay | Admitting: Vascular Surgery

## 2015-03-18 ENCOUNTER — Encounter: Payer: Self-pay | Admitting: Vascular Surgery

## 2015-03-18 ENCOUNTER — Ambulatory Visit (INDEPENDENT_AMBULATORY_CARE_PROVIDER_SITE_OTHER): Payer: Medicare Other | Admitting: Vascular Surgery

## 2015-03-18 VITALS — BP 180/102 | HR 75 | Resp 14 | Ht 68.0 in | Wt 146.8 lb

## 2015-03-18 DIAGNOSIS — N184 Chronic kidney disease, stage 4 (severe): Secondary | ICD-10-CM | POA: Diagnosis not present

## 2015-03-18 NOTE — Progress Notes (Signed)
    Established Dialysis Access  History of Present Illness  [name] [name] is a 81 y.o. (01/23/1933) male who presents for re-evaluation for permanent access.  The patient is right hand dominant.  No previous access procedures have been completed.   The patient was previously evaluated at the end of 2014 for the same issue.  The patient was leaning toward PD at that point.  The patient has never had a previous PPM placed.  He has hand central access previously as part of a CABG.  The patient is not interested in access placement until absolutely necessary.  Per sister-in-law, the patient has had decline in cognitive functions due to dementia since 2014.  This has worsened with death of his wife this last year.  The patient's PMH, PSH, SH, FamHx, Med, and Allergies are unchanged from 10/18/13.  On ROS today: no uremia sx, progression in dementia per sister-in-law  Physical Examination  Filed Vitals:   03/18/15 1000 03/18/15 1004  BP: 180/100 180/102  Pulse: 79 75  Resp: 14   Height: 5\' 8"  (1.727 m)   Weight: 146 lb 12.8 oz (66.588 kg)    Body mass index is 22.33 kg/(m^2).  General: A&O x 3, WD, WN  Pulmonary: Sym exp, good air movt, CTAB, no rales, rhonchi, & wheezing  Cardiac: RRR, Nl S1, S2, no Murmurs, rubs or gallops  Vascular: Vessel Right Left  Radial Palpable Palpable  Ulnar Not Palpable Not Palpable  Brachial Palpable Palpable   Gastrointestinal: soft, NTND, -G/R, - HSM, - masses, - CVAT B  Musculoskeletal: M/S 5/5 throughout , Extremities without  ischemic changes, High brachial vein only 4-5 mm in L axilla, no brachial vein visible  Neurologic: Pain and light touch intact in extremities , Motor exam as listed above   Outside Studies/Documentation 2 pages of outside documents were reviewed including: outpatient nephrology chart and recent labs.  Medical Decision Making  [name] [name] is a 81 y.o. male who presents with chronic kidney disease stage IV,  cognitive dysfunction related to dementia   Based on vein mapping and examination, this patient's permanent access options include: LUA AVG  I had an extensive discussion with this patient in regards to the nature of access surgery, including risk, benefits, and alternatives.    The patient is aware that the risks of access surgery include but are not limited to: bleeding, infection, steal syndrome, nerve damage, ischemic monomelic neuropathy, failure of access to mature, and possible need for additional access procedures in the future.  The patient has not agreed to proceed with the above procedure.  Would place the LUA AVG when he is within 1-2 months of onset of HD.  If his function status declines further, [facility].   Leonides SakeBrian Yichen Gilardi, MD [facility] [place] Office: [phone] Pager: [phone]  03/18/2015, 10:32 AM

## 2015-08-05 ENCOUNTER — Encounter (HOSPITAL_COMMUNITY): Payer: Self-pay | Admitting: Emergency Medicine

## 2015-08-05 ENCOUNTER — Emergency Department (HOSPITAL_COMMUNITY): Payer: Medicare Other

## 2015-08-05 ENCOUNTER — Inpatient Hospital Stay (HOSPITAL_COMMUNITY): Payer: Medicare Other

## 2015-08-05 ENCOUNTER — Inpatient Hospital Stay (HOSPITAL_COMMUNITY)
Admission: EM | Admit: 2015-08-05 | Discharge: 2015-08-12 | DRG: 308 | Disposition: A | Discharge status: Skilled Nursing Facility | Payer: Medicare Other | Attending: Internal Medicine | Admitting: Internal Medicine

## 2015-08-05 DIAGNOSIS — J189 Pneumonia, unspecified organism: Secondary | ICD-10-CM | POA: Diagnosis not present

## 2015-08-05 DIAGNOSIS — Z952 Presence of prosthetic heart valve: Secondary | ICD-10-CM

## 2015-08-05 DIAGNOSIS — J90 Pleural effusion, not elsewhere classified: Secondary | ICD-10-CM

## 2015-08-05 DIAGNOSIS — I2510 Atherosclerotic heart disease of native coronary artery without angina pectoris: Secondary | ICD-10-CM | POA: Diagnosis present

## 2015-08-05 DIAGNOSIS — N185 Chronic kidney disease, stage 5: Secondary | ICD-10-CM | POA: Diagnosis present

## 2015-08-05 DIAGNOSIS — E1122 Type 2 diabetes mellitus with diabetic chronic kidney disease: Secondary | ICD-10-CM | POA: Diagnosis present

## 2015-08-05 DIAGNOSIS — Z66 Do not resuscitate: Secondary | ICD-10-CM | POA: Diagnosis present

## 2015-08-05 DIAGNOSIS — I132 Hypertensive heart and chronic kidney disease with heart failure and with stage 5 chronic kidney disease, or end stage renal disease: Secondary | ICD-10-CM | POA: Diagnosis present

## 2015-08-05 DIAGNOSIS — D696 Thrombocytopenia, unspecified: Secondary | ICD-10-CM | POA: Diagnosis present

## 2015-08-05 DIAGNOSIS — I4892 Unspecified atrial flutter: Secondary | ICD-10-CM | POA: Diagnosis present

## 2015-08-05 DIAGNOSIS — R627 Adult failure to thrive: Secondary | ICD-10-CM | POA: Diagnosis present

## 2015-08-05 DIAGNOSIS — Z87891 Personal history of nicotine dependence: Secondary | ICD-10-CM

## 2015-08-05 DIAGNOSIS — Z515 Encounter for palliative care: Secondary | ICD-10-CM | POA: Diagnosis present

## 2015-08-05 DIAGNOSIS — I959 Hypotension, unspecified: Secondary | ICD-10-CM | POA: Diagnosis not present

## 2015-08-05 DIAGNOSIS — E119 Type 2 diabetes mellitus without complications: Secondary | ICD-10-CM | POA: Diagnosis not present

## 2015-08-05 DIAGNOSIS — Z953 Presence of xenogenic heart valve: Secondary | ICD-10-CM

## 2015-08-05 DIAGNOSIS — Z6820 Body mass index (BMI) 20.0-20.9, adult: Secondary | ICD-10-CM | POA: Diagnosis not present

## 2015-08-05 DIAGNOSIS — Z951 Presence of aortocoronary bypass graft: Secondary | ICD-10-CM

## 2015-08-05 DIAGNOSIS — R42 Dizziness and giddiness: Secondary | ICD-10-CM

## 2015-08-05 DIAGNOSIS — F0390 Unspecified dementia without behavioral disturbance: Secondary | ICD-10-CM | POA: Diagnosis present

## 2015-08-05 DIAGNOSIS — N179 Acute kidney failure, unspecified: Secondary | ICD-10-CM | POA: Diagnosis not present

## 2015-08-05 DIAGNOSIS — E43 Unspecified severe protein-calorie malnutrition: Secondary | ICD-10-CM | POA: Insufficient documentation

## 2015-08-05 DIAGNOSIS — E875 Hyperkalemia: Secondary | ICD-10-CM | POA: Diagnosis present

## 2015-08-05 DIAGNOSIS — N184 Chronic kidney disease, stage 4 (severe): Secondary | ICD-10-CM | POA: Diagnosis not present

## 2015-08-05 DIAGNOSIS — R0602 Shortness of breath: Secondary | ICD-10-CM

## 2015-08-05 DIAGNOSIS — I5042 Chronic combined systolic (congestive) and diastolic (congestive) heart failure: Secondary | ICD-10-CM | POA: Diagnosis present

## 2015-08-05 DIAGNOSIS — R1310 Dysphagia, unspecified: Secondary | ICD-10-CM | POA: Diagnosis present

## 2015-08-05 DIAGNOSIS — R0600 Dyspnea, unspecified: Secondary | ICD-10-CM | POA: Diagnosis not present

## 2015-08-05 DIAGNOSIS — E785 Hyperlipidemia, unspecified: Secondary | ICD-10-CM | POA: Diagnosis present

## 2015-08-05 DIAGNOSIS — I4891 Unspecified atrial fibrillation: Secondary | ICD-10-CM | POA: Diagnosis present

## 2015-08-05 DIAGNOSIS — Z954 Presence of other heart-valve replacement: Secondary | ICD-10-CM

## 2015-08-05 DIAGNOSIS — Z8673 Personal history of transient ischemic attack (TIA), and cerebral infarction without residual deficits: Secondary | ICD-10-CM

## 2015-08-05 DIAGNOSIS — D638 Anemia in other chronic diseases classified elsewhere: Secondary | ICD-10-CM | POA: Diagnosis present

## 2015-08-05 HISTORY — DX: Unspecified dementia, unspecified severity, without behavioral disturbance, psychotic disturbance, mood disturbance, and anxiety: F03.90

## 2015-08-05 LAB — BASIC METABOLIC PANEL
ANION GAP: 8 (ref 5–15)
BUN: 54 mg/dL — AB (ref 6–20)
CALCIUM: 9 mg/dL (ref 8.9–10.3)
CO2: 22 mmol/L (ref 22–32)
Chloride: 110 mmol/L (ref 101–111)
Creatinine, Ser: 5.55 mg/dL — ABNORMAL HIGH (ref 0.61–1.24)
GFR calc Af Amer: 10 mL/min — ABNORMAL LOW (ref >60)
GFR, EST NON AFRICAN AMERICAN: 9 mL/min — AB (ref >60)
Glucose, Bld: 125 mg/dL — ABNORMAL HIGH (ref 65–99)
POTASSIUM: 4.9 mmol/L (ref 3.5–5.1)
SODIUM: 140 mmol/L (ref 135–145)

## 2015-08-05 LAB — URINALYSIS, ROUTINE W REFLEX MICROSCOPIC
BILIRUBIN URINE: NEGATIVE
Glucose, UA: NEGATIVE mg/dL
HGB URINE DIPSTICK: NEGATIVE
KETONES UR: NEGATIVE mg/dL
Leukocytes, UA: NEGATIVE
NITRITE: NEGATIVE
PROTEIN: 100 mg/dL — AB
SPECIFIC GRAVITY, URINE: 1.008 (ref 1.005–1.030)
UROBILINOGEN UA: 0.2 mg/dL (ref 0.0–1.0)
pH: 5 (ref 5.0–8.0)

## 2015-08-05 LAB — URINE MICROSCOPIC-ADD ON

## 2015-08-05 LAB — CBC
HEMATOCRIT: 41.2 % (ref 39.0–52.0)
Hemoglobin: 13.8 g/dL (ref 13.0–17.0)
MCH: 30.7 pg (ref 26.0–34.0)
MCHC: 33.5 g/dL (ref 30.0–36.0)
MCV: 91.8 fL (ref 78.0–100.0)
Platelets: 130 10*3/uL — ABNORMAL LOW (ref 150–400)
RBC: 4.49 MIL/uL (ref 4.22–5.81)
RDW: 17.9 % — AB (ref 11.5–15.5)
WBC: 7.3 10*3/uL (ref 4.0–10.5)

## 2015-08-05 LAB — TSH: TSH: 1.61 u[IU]/mL (ref 0.350–4.500)

## 2015-08-05 LAB — CBG MONITORING, ED: GLUCOSE-CAPILLARY: 162 mg/dL — AB (ref 65–99)

## 2015-08-05 LAB — BRAIN NATRIURETIC PEPTIDE: B NATRIURETIC PEPTIDE 5: 1277.2 pg/mL — AB (ref 0.0–100.0)

## 2015-08-05 LAB — TROPONIN I: Troponin I: 0.33 ng/mL — ABNORMAL HIGH (ref <0.031)

## 2015-08-05 LAB — PROTIME-INR
INR: 1.18 (ref 0.00–1.49)
PROTHROMBIN TIME: 15.2 s (ref 11.6–15.2)

## 2015-08-05 MED ORDER — NEPRO/CARBSTEADY PO LIQD
237.0000 mL | Freq: Two times a day (BID) | ORAL | Status: DC
  Administered 2015-08-06: 237 mL via ORAL

## 2015-08-05 MED ORDER — DEXTROSE 5 % IV SOLN
500.0000 mg | INTRAVENOUS | Status: DC
  Administered 2015-08-05 – 2015-08-06 (×2): 500 mg via INTRAVENOUS
  Filled 2015-08-05 (×3): qty 500

## 2015-08-05 MED ORDER — SODIUM CHLORIDE 0.9 % IV SOLN
INTRAVENOUS | Status: DC
  Administered 2015-08-05: 23:00:00 via INTRAVENOUS

## 2015-08-05 MED ORDER — DEXTROSE 5 % IV SOLN
1.0000 g | Freq: Once | INTRAVENOUS | Status: AC
  Administered 2015-08-05: 1 g via INTRAVENOUS
  Filled 2015-08-05: qty 10

## 2015-08-05 MED ORDER — HEPARIN SODIUM (PORCINE) 5000 UNIT/ML IJ SOLN
5000.0000 [IU] | Freq: Three times a day (TID) | INTRAMUSCULAR | Status: DC
  Administered 2015-08-05 – 2015-08-07 (×5): 5000 [IU] via SUBCUTANEOUS
  Filled 2015-08-05 (×4): qty 1

## 2015-08-05 MED ORDER — ACETAMINOPHEN 325 MG PO TABS: 650.0000 mg | ORAL_TABLET | Freq: Four times a day (QID) | ORAL | Status: DC | PRN

## 2015-08-05 MED ORDER — FUROSEMIDE 10 MG/ML IJ SOLN
60.0000 mg | Freq: Two times a day (BID) | INTRAMUSCULAR | Status: DC
Start: 2015-08-05 — End: 2015-08-07
  Administered 2015-08-05 – 2015-08-06 (×3): 60 mg via INTRAVENOUS
  Filled 2015-08-05 (×4): qty 6

## 2015-08-05 MED ORDER — MORPHINE SULFATE (PF) 2 MG/ML IV SOLN: 1.0000 mg | INTRAVENOUS | Status: DC | PRN

## 2015-08-05 MED ORDER — DEXTROSE 5 % IV SOLN
1.0000 g | INTRAVENOUS | Status: DC
  Administered 2015-08-06: 1 g via INTRAVENOUS
  Filled 2015-08-05 (×2): qty 10

## 2015-08-05 MED ORDER — SODIUM CHLORIDE 0.9 % IV BOLUS (SEPSIS)
500.0000 mL | Freq: Once | INTRAVENOUS | Status: AC
  Administered 2015-08-05: 500 mL via INTRAVENOUS

## 2015-08-05 MED ORDER — INFLUENZA VAC SPLIT QUAD 0.5 ML IM SUSY
0.5000 mL | PREFILLED_SYRINGE | INTRAMUSCULAR | Status: AC
  Administered 2015-08-06: 0.5 mL via INTRAMUSCULAR
  Filled 2015-08-05: qty 0.5

## 2015-08-05 MED ORDER — SODIUM CHLORIDE 0.9 % IJ SOLN
3.0000 mL | Freq: Two times a day (BID) | INTRAMUSCULAR | Status: DC
Start: 2015-08-05 — End: 2015-08-12
  Administered 2015-08-05 – 2015-08-12 (×3): 3 mL via INTRAVENOUS

## 2015-08-05 MED ORDER — HYDROCODONE-ACETAMINOPHEN 5-325 MG PO TABS: 1.0000 | ORAL_TABLET | ORAL | Status: DC | PRN

## 2015-08-05 MED ORDER — METOPROLOL TARTRATE 1 MG/ML IV SOLN: 5.0000 mg | INTRAVENOUS | Status: DC | PRN

## 2015-08-05 MED ORDER — CARVEDILOL 3.125 MG PO TABS
3.1250 mg | ORAL_TABLET | Freq: Two times a day (BID) | ORAL | Status: DC
  Administered 2015-08-05 – 2015-08-06 (×2): 3.125 mg via ORAL
  Filled 2015-08-05 (×2): qty 1

## 2015-08-05 MED ORDER — ONDANSETRON HCL 4 MG/2ML IJ SOLN: 4.0000 mg | Freq: Four times a day (QID) | INTRAMUSCULAR | Status: DC | PRN

## 2015-08-05 MED ORDER — CALCITRIOL 0.25 MCG PO CAPS
0.2500 ug | ORAL_CAPSULE | Freq: Every day | ORAL | Status: DC
  Administered 2015-08-06 – 2015-08-12 (×7): 0.25 ug via ORAL
  Filled 2015-08-05 (×7): qty 1

## 2015-08-05 MED ORDER — ONDANSETRON HCL 4 MG PO TABS
4.0000 mg | ORAL_TABLET | Freq: Four times a day (QID) | ORAL | Status: DC | PRN
  Administered 2015-08-06: 4 mg via ORAL
  Filled 2015-08-05: qty 1

## 2015-08-05 MED ORDER — ISOSORB DINITRATE-HYDRALAZINE 20-37.5 MG PO TABS
1.0000 | ORAL_TABLET | Freq: Three times a day (TID) | ORAL | Status: DC
  Administered 2015-08-05 – 2015-08-07 (×5): 1 via ORAL
  Filled 2015-08-05 (×5): qty 1

## 2015-08-05 MED ORDER — ACETAMINOPHEN 650 MG RE SUPP: 650.0000 mg | Freq: Four times a day (QID) | RECTAL | Status: DC | PRN

## 2015-08-05 MED ORDER — AMIODARONE HCL 200 MG PO TABS
200.0000 mg | ORAL_TABLET | Freq: Every day | ORAL | Status: DC
  Administered 2015-08-05 – 2015-08-06 (×2): 200 mg via ORAL
  Filled 2015-08-05 (×3): qty 1

## 2015-08-05 MED ORDER — DONEPEZIL HCL 5 MG PO TABS
5.0000 mg | ORAL_TABLET | Freq: Every day | ORAL | Status: DC
  Administered 2015-08-05 – 2015-08-11 (×7): 5 mg via ORAL
  Filled 2015-08-05 (×7): qty 1

## 2015-08-05 NOTE — ED Notes (Signed)
Patient transported to CT 

## 2015-08-05 NOTE — ED Provider Notes (Signed)
CSN: [id number]     Arrival date & time 08/05/15  1417 History   First MD Initiated Contact with Patient 08/05/15 1500     Chief Complaint  Patient presents with  . Dizziness   [name] [name] is a 81 y.o. male with a past medical history significant for chronic kidney disease, CHF, dementia, and hypertension who presents with lightheadedness, productive cough, fatigue, headache, decreased by mouth intake, and worsening confusion. The patient is calmed by her family who report that the last 3 weeks, the patient has had gradually worsening confusion. They say that the patient has times where she is at her baseline however, at some point she is not oriented to place or time. They also report that the patient has been having lightheadedness for several weeks. The patient denies any syncope but describes an acute lightheadedness and fatigue. The patient denies any chest pain, shortness of breath, or palpitations with does report a weight and milky production from a cough. This cough is worsening over the last several days to 1 week. The patient denies any fevers but does report intermittent chills. Patient denies any dysuria, hematuria, constipation, or diarrhea. The patient reports that they are currently speaking with the vascular nephrology teams about possible dialysis in the future however, they elected to wait until the patient has more edema. He denies any unilateral leg pain or leg swelling. The patient has no history of blood clot. The patient lives at home alone and is in charge of his own medications. In the setting of worsening confusion, suspect patient may not be taking all medications and the family just realized the patient has been out of Aricept for some time. The patient denies any Pain on arrival but says he had headaches last week.   (Consider location/radiation/quality/duration/timing/severity/associated sxs/prior Treatment) Patient is a 81 y.o. male presenting with dizziness and  cough. The history is provided by the patient and a relative. No language interpreter was used.  Dizziness Quality:  Lightheadedness Severity:  Moderate Onset quality:  Gradual Duration:  3 weeks Timing:  Constant Progression:  Worsening Chronicity:  New Context: not with loss of consciousness   Relieved by:  Nothing Worsened by:  Nothing Ineffective treatments:  None tried Associated symptoms: headaches (intermittent)   Associated symptoms: no chest pain, no nausea, no shortness of breath, no vomiting and no weakness   Cough Cough characteristics:  Productive Sputum characteristics:  Clear and white Severity:  Moderate Onset quality:  Gradual Duration:  1 week Timing:  Intermittent Progression:  Waxing and waning Chronicity:  New Smoker: no   Relieved by:  Nothing Worsened by:  Nothing tried Ineffective treatments:  None tried Associated symptoms: chills and headaches (intermittent)   Associated symptoms: no chest pain, no diaphoresis, no fever, no rash, no rhinorrhea, no shortness of breath and no wheezing   Headaches:    Severity:  Mild   Timing:  Intermittent   Progression:  Waxing and waning   Past Medical History  Diagnosis Date  . Hypertension   . Renal disorder     renal failure  . CHF (congestive heart failure) (HCC)   . Dementia    Past Surgical History  Procedure Laterality Date  . Cardiac surgery    . Cardiac valve surgery    . Femoral artery stent      right leg per pt   History reviewed. No pertinent family history. Social History  Substance Use Topics  . Smoking status: Former Smoker -- 0.25 packs/day for  15 years    Types: Cigarettes    Quit date: 08/10/1983  . Smokeless tobacco: Never Used  . Alcohol Use: No    Review of Systems  Constitutional: Positive for chills, appetite change (no appetite) and unexpected weight change. Negative for fever and diaphoresis.  HENT: Negative for congestion and rhinorrhea.   Respiratory: Positive for  cough. Negative for chest tightness, shortness of breath and wheezing.   Cardiovascular: Negative for chest pain.  Gastrointestinal: Negative for nausea and vomiting.  Genitourinary: Negative for dysuria and flank pain.  Musculoskeletal: Negative for back pain, neck pain and neck stiffness.  Skin: Negative for rash and wound.  Neurological: Positive for light-headedness and headaches (intermittent). Negative for dizziness, seizures, facial asymmetry, speech difficulty, weakness and numbness.  Psychiatric/Behavioral: Positive for confusion. Negative for agitation.  All other systems reviewed and are negative.     Allergies  Clams  Home Medications   Prior to Admission medications   Medication Sig Start Date End Date Taking? Authorizing Provider  amiodarone (PACERONE) 200 MG tablet Take 1 tablet (200 mg total) by mouth daily. 03/09/13   Rinaldo Cloud, MD  amoxicillin (AMOXIL) 500 MG capsule  02/23/15   Historical Provider, MD  calcitRIOL (ROCALTROL) 0.25 MCG capsule  02/05/15   Historical Provider, MD  carvedilol (COREG) 3.125 MG tablet Take 1 tablet (3.125 mg total) by mouth every 12 (twelve) hours. 04/05/12 04/05/13  Rinaldo Cloud, MD  furosemide (LASIX) 40 MG tablet Take 2 tablets (80 mg total) by mouth 2 (two) times daily. 04/05/12   Rinaldo Cloud, MD  isosorbide-hydrALAZINE (BIDIL) 20-37.5 MG per tablet Take 1 tablet by mouth 3 (three) times daily.    Historical Provider, MD  memantine (NAMENDA) 5 MG tablet Take 1 tablet (5 mg total) by mouth daily. 03/09/13   Rinaldo Cloud, MD  potassium chloride (K-DUR,KLOR-CON) 10 MEQ tablet Take 10 mEq by mouth daily. 04/05/12 04/05/13  Rinaldo Cloud, MD   BP 129/84 mmHg  Pulse 125  Temp(Src) 97.7 F (36.5 C) (Oral)  Resp 17  SpO2 97% Physical Exam  Constitutional: He is oriented to person, place, and time. He appears well-developed and well-nourished. No distress.  HENT:  Head: Normocephalic and atraumatic.  Mouth/Throat: No oropharyngeal  exudate.  Eyes: Conjunctivae are normal. Pupils are equal, round, and reactive to light.  Neck: Normal range of motion.  Cardiovascular: Regular rhythm, normal heart sounds and intact distal pulses.  Tachycardia present.   No murmur heard. Pulmonary/Chest: Effort normal and breath sounds normal. No accessory muscle usage or stridor. No bradypnea. No respiratory distress. He has no wheezes. He has no rhonchi. He exhibits no tenderness.  Abdominal: There is no tenderness. There is no rebound.  Musculoskeletal: He exhibits no tenderness.  Neurological: He is alert and oriented to person, place, and time. He has normal strength. He is not disoriented. No cranial nerve deficit or sensory deficit. He exhibits normal muscle tone. Coordination normal. GCS eye subscore is 4. GCS verbal subscore is 5. GCS motor subscore is 6.  Skin: He is not diaphoretic.  Psychiatric: He has a normal mood and affect.  Nursing note and vitals reviewed.   ED Course  Procedures (including critical care time) Labs Review Labs Reviewed  BASIC METABOLIC PANEL - Abnormal; Notable for the following:    Glucose, Bld 125 (*)    BUN 54 (*)    Creatinine, Ser 5.55 (*)    GFR calc non Af Amer 9 (*)    GFR calc Af Amer 10 (*)  All other components within normal limits  CBC - Abnormal; Notable for the following:    RDW 17.9 (*)    Platelets 130 (*)    All other components within normal limits  URINALYSIS, ROUTINE W REFLEX MICROSCOPIC (NOT AT [facility]) - Abnormal; Notable for the following:    Protein, ur 100 (*)    All other components within normal limits  URINE MICROSCOPIC-ADD ON - Abnormal; Notable for the following:    Casts GRANULAR CAST (*)    All other components within normal limits  TROPONIN I - Abnormal; Notable for the following:    Troponin I 0.33 (*)    All other components within normal limits  BRAIN NATRIURETIC PEPTIDE - Abnormal; Notable for the following:    B Natriuretic Peptide 1277.2 (*)    All  other components within normal limits  CBG MONITORING, ED - Abnormal; Notable for the following:    Glucose-Capillary 162 (*)    All other components within normal limits  PROTIME-INR  TSH  HEMOGLOBIN A1C  BASIC METABOLIC PANEL  CBC    Imaging Review Dg Chest 2 View  08/05/2015  CLINICAL DATA:  Three-week history of weakness and dizziness. Cough. EXAM: CHEST  2 VIEW COMPARISON:  Mar 01, 2013 FINDINGS: There is mild loculated effusion at the left base. There is no edema or consolidation. Heart is mildly enlarged with pulmonary vascularity within normal limits. No adenopathy. Patient is status post aortic valve replacement and coronary artery bypass grafting. No adenopathy. There is degenerative change in the thoracic spine. IMPRESSION: Mild loculated effusion left base. No edema or consolidation. Stable cardiomegaly. Electronically Signed   By: Bretta Bang III M.D.   On: 08/05/2015 15:05   Ct Head Wo Contrast  08/05/2015  CLINICAL DATA:  Intermittent dizziness for 3 weeks, confusion. Decreased appetite, weight loss. Headache. EXAM: CT HEAD WITHOUT CONTRAST TECHNIQUE: Contiguous axial images were obtained from the base of the skull through the vertex without intravenous contrast. COMPARISON:  None. FINDINGS: Old bilateral basal ganglia lacune or infarcts. Low-density throughout the deep white matter compatible with chronic small vessel disease. No acute infarction. No hemorrhage or hydrocephalus. No mass lesion or midline shift. No acute calvarial abnormality. Visualized paranasal sinuses and mastoids clear. Orbital soft tissues unremarkable. IMPRESSION: Old bilateral basal ganglia lacunar infarcts. No acute intracranial abnormality. Atrophy, chronic microvascular disease. Electronically Signed   By: Charlett Nose M.D.   On: 08/05/2015 16:59   Ct Chest Wo Contrast  08/05/2015  CLINICAL DATA:  81 year old male with cough, chills and possible left pleural effusion on recent chest radiograph  EXAM: CT CHEST WITHOUT CONTRAST TECHNIQUE: Multidetector CT imaging of the chest was performed following the standard protocol without IV contrast. COMPARISON:  08/05/2015 and prior radiographs.  09/04/2006 CT FINDINGS: Mediastinum/Nodes: Cardiomegaly and aortic valve replacement again noted. Heavy coronary artery calcifications are identified. An ascending thoracic aortic aneurysm measuring 5 cm is unchanged. There is no evidence of pericardial effusion or enlarged lymph nodes. Lungs/Pleura: Unchanged left pleural thickening accounts for the recent radiographic abnormality. There is no evidence of pleural effusion. Mild bibasilar scarring is present. Moderate centrilobular emphysema is identified. There is no evidence of airspace disease, suspicious nodule, consolidation or mass. There is no evidence of pneumothorax. Upper abdomen: No acute abnormalities. Musculoskeletal: No acute or suspicious abnormalities. Mild degenerative disc disease in the lower thoracic spine noted. IMPRESSION: No evidence of acute abnormality. Chronic left pleural thickening accounts for abnormality identified on recent chest radiograph. No pleural effusion. Unchanged 5  cm ascending thoracic aortic aneurysm, cardiomegaly and coronary artery disease. Emphysema. Electronically Signed   By: Harmon PierJeffrey  Hu M.D.   On: 08/05/2015 20:54   I have personally reviewed and evaluated these images and lab results as part of my medical decision-making.   EKG Interpretation   Date/Time:  Wednesday August 05 2015 15:24:14 EDT Ventricular Rate:  125 PR Interval:  116 QRS Duration: 99 QT Interval:  335 QTC Calculation: 483 R Axis:   -67 Text Interpretation:  Sinus tachycardia Paired ventricular premature  complexes LAD, consider left anterior fascicular block LVH with secondary  repolarization abnormality Anterior Q waves, possibly due to LVH Mild ST  depression in inferior leads, likely rate related.  Confirmed by Manus GunningANCOUR   MD, Jeannett SeniorSTEPHEN  [phone]) on 08/05/2015 3:37:01 PM      MDM   [name] [name] is a 81 y.o. male with a past medical history significant for chronic kidney disease, CHF, dementia, and hypertension who presents with lightheadedness, productive cough, fatigue, headache, decreased by mouth intake, and worsening confusion. The patient was brought in by EMS who report that there is initial EKG showed concern for atrial flutter however, EKG on arrival, revealed sinus tachycardia. Given the patient's productive cough, chills, tachycardia, and fatigue, highly suspect pneumonia. The patient's family's primary concern about stroke given his intermittent headaches and confusion. Also suspect a component of dehydration as the patient has not been eating or drinking recently and he has history of chronic kidney disease.   Initial have a diagnostic workup with imaging, EKG, and laboratory testing to evaluate etiology of his symptoms.  The patient's EKG showed sinus tachycardia. The patient's initial chest x-ray revealed a left-sided pleural effusion. In the setting of the previous symptoms described above, the patient was treated for community acquired pneumonia. The patient will be given fluids.   The patient was also found to have worsened creatinine from prior.  The patient will be admitted to the hospitalist service for further management of his pneumonia and kidney dysfunction. The patient had no other problems while remaining in the emergency department and the patient was admitted in stable condition.  This patient was seen with Dr. Verdie MosherLiu, emergency medicine attending.  Final diagnoses:  Loculated pleural effusion       Theda Belfasthris Hilmar Moldovan, MD 08/06/15 [id number]  Lavera Guiseana Duo Liu, MD 08/06/15 0140

## 2015-08-05 NOTE — ED Notes (Addendum)
Pt from home via [facility] with c/o intermittent dizziness x 3 weeks.  Pt wife states she called to see PCP and they advised if it happened again before being seen to call EMS.  Pt in early dementia, oriented x 3, disoriented to time.  Wife additionally reports decreased appetite and possible weight loss.  On EMS arrival pt was no longer dizzy, no SOB, no complaints, denies blood in stool or abdominal pain, negative orthostatic.  Pt NAD, A&O.

## 2015-08-05 NOTE — H&P (Signed)
[facility] History and Physical  [name] [name] WUJ:[id number] DOB: 12/27/1932 DOA: 08/05/2015  Referring physician: EDP  PCP: Rinaldo Cloud, MD   Chief Complaint: Dizziness  HPI: [name] [name] is a 81 y.o. male with past medical history of CKD stage 4-5, dementia, CHF and hypertension. Patient presented with dizziness. Patient seen with his sister-in-law at bedside, mentioned that he have dizziness for the past 2 weeks comes and goes increase with ambulation, upon further questioning this dizziness is likely lightheadedness. Patient also was started to have some cough with clear sputum. Patient called his cardiologist today who asked him to come to the hospital for further evaluation. In the ED was found to have creatinine worsening to 5.5, tachycardiac with lower extremity swelling, CT scan showed no acute events, CXR showed mild loculated effusion in the left side with cardiomegaly. Patient admitted to the hospital for further evaluation.  Review of Systems:  Constitutional: negative for anorexia, fevers and sweats Eyes: negative for irritation, redness and visual disturbance Ears, nose, mouth, throat, and face: negative for earaches, epistaxis, nasal congestion and sore throat Respiratory: negative for cough, dyspnea on exertion, sputum and wheezing Cardiovascular: negative for chest pain, dyspnea, lower extremity edema, orthopnea, palpitations and syncope Gastrointestinal: negative for abdominal pain, constipation, diarrhea, melena, nausea and vomiting Genitourinary:negative for dysuria, frequency and hematuria Hematologic/lymphatic: negative for bleeding, easy bruising and lymphadenopathy Musculoskeletal:negative for arthralgias, muscle weakness and stiff joints Neurological: negative for coordination problems, gait problems, headaches and weakness Endocrine: negative for diabetic symptoms including polydipsia, polyuria and weight loss Allergic/Immunologic: negative  for anaphylaxis, hay fever and urticaria  Past Medical History  Diagnosis Date  . Hypertension   . Renal disorder     renal failure  . CHF (congestive heart failure) (HCC)   . Dementia    Past Surgical History  Procedure Laterality Date  . Cardiac surgery    . Cardiac valve surgery    . Femoral artery stent      right leg per pt   Social History:   reports that he quit smoking about 32 years ago. His smoking use included Cigarettes. He has a 3.75 pack-year smoking history. He has never used smokeless tobacco. He reports that he does not drink alcohol or use illicit drugs.  Allergies  Allergen Reactions  . Clams [employer] Allergy]     Gout     Family history: No family history of CAD  Prior to Admission medications   Medication Sig Start Date End Date Taking? Authorizing Provider  amiodarone (PACERONE) 200 MG tablet Take 1 tablet (200 mg total) by mouth daily. 03/09/13  Yes Rinaldo Cloud, MD  calcitRIOL (ROCALTROL) 0.25 MCG capsule Take 0.25 mcg by mouth daily.  02/05/15  Yes Historical Provider, MD  carvedilol (COREG) 3.125 MG tablet Take 3.125 mg by mouth 2 (two) times daily with a meal.   Yes Historical Provider, MD  donepezil (ARICEPT) 5 MG tablet Take 5 mg by mouth at bedtime.   Yes Historical Provider, MD  furosemide (LASIX) 40 MG tablet Take 2 tablets (80 mg total) by mouth 2 (two) times daily. Patient taking differently: Take 40 mg by mouth 2 (two) times daily.  04/05/12  Yes Rinaldo Cloud, MD  isosorbide-hydrALAZINE (BIDIL) 20-37.5 MG per tablet Take 1 tablet by mouth 3 (three) times daily.   Yes Historical Provider, MD  carvedilol (COREG) 3.125 MG tablet Take 1 tablet (3.125 mg total) by mouth every 12 (twelve) hours. 04/05/12 04/05/13  Rinaldo Cloud, MD  memantine [facility]) 5  MG tablet Take 1 tablet (5 mg total) by mouth daily. Patient not taking: Reported on 08/05/2015 03/09/13   Rinaldo Cloud, MD   Physical Exam: Filed Vitals:   08/05/15 1730  BP: 155/103  Pulse:  128  Temp:   Resp: 27   Constitutional: Oriented to person, place, and time. Well-developed and well-nourished. Cooperative.  Head: Normocephalic and atraumatic.  Nose: Nose normal.  Mouth/Throat: Uvula is midline, oropharynx is clear and moist and mucous membranes are normal.  Eyes: Conjunctivae and EOM are normal. Pupils are equal, round, and reactive to light.  Neck: Trachea normal and normal range of motion. Neck supple.  Cardiovascular:  tachycardic,  S1 normal, S2 normal, normal heart sounds and intact distal pulses.   Pulmonary/Chest: Effort normal and breath sounds normal.  Abdominal: Soft. Bowel sounds are normal. There is no hepatosplenomegaly. There is no tenderness.  Musc there is bilateral +2 to +3 pedal pitting edema  Neurological: Alert and oriented to person, place, and time. Has normal strength. No cranial nerve deficit or sensory deficit.  Skin: Skin is warm, dry and intact.  Psychiatric: Has a normal mood and affect. Speech is normal and behavior is normal.   Labs on Admission:  Basic Metabolic Panel:  Recent Labs Lab 08/05/15 1610  NA 140  K 4.9  CL 110  CO2 22  GLUCOSE 125*  BUN 54*  CREATININE 5.55*  CALCIUM 9.0   Liver Function Tests: No results for input(s): AST, ALT, ALKPHOS, BILITOT, PROT, ALBUMIN in the last 168 hours. No results for input(s): LIPASE, AMYLASE in the last 168 hours. No results for input(s): AMMONIA in the last 168 hours. CBC:  Recent Labs Lab 08/05/15 1610  WBC 7.3  HGB 13.8  HCT 41.2  MCV 91.8  PLT 130*   Cardiac Enzymes: No results for input(s): CKTOTAL, CKMB, CKMBINDEX, TROPONINI in the last 168 hours.  BNP (last 3 results) No results for input(s): BNP in the last 8760 hours.  ProBNP (last 3 results) No results for input(s): PROBNP in the last 8760 hours.  CBG:  Recent Labs Lab 08/05/15 1445  GLUCAP 162*    Radiological Exams on Admission: Dg Chest 2 View  08/05/2015  CLINICAL DATA:  Three-week history  of weakness and dizziness. Cough. EXAM: CHEST  2 VIEW COMPARISON:  Mar 01, 2013 FINDINGS: There is mild loculated effusion at the left base. There is no edema or consolidation. Heart is mildly enlarged with pulmonary vascularity within normal limits. No adenopathy. Patient is status post aortic valve replacement and coronary artery bypass grafting. No adenopathy. There is degenerative change in the thoracic spine. IMPRESSION: Mild loculated effusion left base. No edema or consolidation. Stable cardiomegaly. Electronically Signed   By: Bretta Bang III M.D.   On: 08/05/2015 15:05   Ct Head Wo Contrast  08/05/2015  CLINICAL DATA:  Intermittent dizziness for 3 weeks, confusion. Decreased appetite, weight loss. Headache. EXAM: CT HEAD WITHOUT CONTRAST TECHNIQUE: Contiguous axial images were obtained from the base of the skull through the vertex without intravenous contrast. COMPARISON:  None. FINDINGS: Old bilateral basal ganglia lacune or infarcts. Low-density throughout the deep white matter compatible with chronic small vessel disease. No acute infarction. No hemorrhage or hydrocephalus. No mass lesion or midline shift. No acute calvarial abnormality. Visualized paranasal sinuses and mastoids clear. Orbital soft tissues unremarkable. IMPRESSION: Old bilateral basal ganglia lacunar infarcts. No acute intracranial abnormality. Atrophy, chronic microvascular disease. Electronically Signed   By: Charlett Nose M.D.   On: 08/05/2015 16:59  EKG: Independently reviewed.sinus tachycardia with rate is 120  Assessment/Plan Principal Problem:   Dizziness Active Problems:   Chronic kidney disease (CKD), stage IV (severe) (HCC)   Pneumonia   Diabetes mellitus type 2, diet-controlled (HCC)   CAD (coronary artery disease)   Status post aortic valve replacement    Acute on chronic kidney disease stage IV Patient creatinine baseline was 4.5 about 2 years ago in May 2014. Presented with creatinine of 5.5,  this is probably represent worsening of his renal function. Has lower extremity edema, chest x-ray did not show convincing edema about left-sided effusion. Increased his Lasix and follow inpatient weight and input/output. We will consult nephrology in a.m.  Pneumonia Diagnosed with pneumonia in the ED, started on antibiotics. I continued the antibiotics and started patient on bronchodilators, mucolytics, antitussives and oxygen as needed. I'm not so convinced that patient has pneumonia as he does not have leukocytosis of fever. If patient continued to improve with diuretics I will discontinue antibiotics.   Lightheadedness Unclear etiology, patient has same presentation before for acute on chronic CHF exacerbation. Lightheadedness could be secondary to the low cardiac outputs and tachycardia. Check albumin to exclude third spacing and intravascular volume depletion. PT/OT to evaluate and treat.  Tachycardia Could be likely secondary to mild fluid overload and cardiomegaly. Patient adherence to medications also questionable, restarted Coreg and amiodarone. Place patient on telemetry, 2-D echocardiogram and check 3 sets of cardiac enzymes. Patient is not hypoxic nor he has chest pain there is no suspicion for PE.  Left-sided effusion Chest x-ray showed left-sided mild loculated pleural effusion. I will obtain CT chest without contrast to evaluate further.  Diabetes mellitus type 2 Diet-controlled, check hemoglobin A1c, carbohydrate modified diet.  Status post aortic valve replacement Bioprosthetic valve, no indication for anticoagulation.    Code Status: full code Family Communication: Plan discussed with the patient the presence of his sister-in-law at bedside. Disposition Plan:  inpatient, telemetry  Time spent: 70 minutes  Avanell Banwart A, MD [facility] Pager [phone]

## 2015-08-05 NOTE — ED Provider Notes (Signed)
I saw and evaluated the patient, reviewed the resident's note and I agree with the findings and plan.   EKG Interpretation   Date/Time:  Wednesday August 05 2015 15:24:14 EDT Ventricular Rate:  125 PR Interval:  116 QRS Duration: 99 QT Interval:  335 QTC Calculation: 483 R Axis:   -67 Text Interpretation:  Sinus tachycardia Paired ventricular premature  complexes LAD, consider left anterior fascicular block LVH with secondary  repolarization abnormality Anterior Q waves, possibly due to LVH Mild ST  depression in inferior leads, likely rate related.  Confirmed by Manus GunningANCOUR   MD, Jeannett SeniorSTEPHEN [phone]) on 08/05/2015 3:37:8701 PM      81 year old male with history of stage IV CKD (with plans to initiate HD in the future), CAD s/ CABG, AS s/p AVR who presents with decline over 2-3 weeks. Has been having decreased PO intake, increasing disorientation, intermitting lightheadedness with headaches. Brought to ED by family for concern of failure to thrive and disorientation. Tachycardic on arrival, but afebrile and normotensive. Breathing comfortably, but with decreased breath sounds at lung base. Mild pedal edema without evidence of fluid overload. Appears dry on exam. EKG reveals sinus tachycardia with inferior ST changes. CXR with mild loculated effusion in left lung. Having cough with chills and will treat for CAP. Fluid challenge with basic blood work pending. CT head for disorientation and intermittent headaches. Will admit for ongoing management.  Lavera Guiseana Duo Silviano Neuser, MD 08/06/15 [phone]

## 2015-08-06 ENCOUNTER — Inpatient Hospital Stay (HOSPITAL_COMMUNITY): Payer: Medicare Other

## 2015-08-06 DIAGNOSIS — N184 Chronic kidney disease, stage 4 (severe): Secondary | ICD-10-CM

## 2015-08-06 DIAGNOSIS — E43 Unspecified severe protein-calorie malnutrition: Secondary | ICD-10-CM | POA: Insufficient documentation

## 2015-08-06 DIAGNOSIS — I2510 Atherosclerotic heart disease of native coronary artery without angina pectoris: Secondary | ICD-10-CM

## 2015-08-06 DIAGNOSIS — I4892 Unspecified atrial flutter: Secondary | ICD-10-CM | POA: Diagnosis not present

## 2015-08-06 DIAGNOSIS — R42 Dizziness and giddiness: Secondary | ICD-10-CM

## 2015-08-06 LAB — BASIC METABOLIC PANEL
ANION GAP: 14 (ref 5–15)
BUN: 61 mg/dL — ABNORMAL HIGH (ref 6–20)
CALCIUM: 8.8 mg/dL — AB (ref 8.9–10.3)
CHLORIDE: 111 mmol/L (ref 101–111)
CO2: 19 mmol/L — AB (ref 22–32)
Creatinine, Ser: 5.3 mg/dL — ABNORMAL HIGH (ref 0.61–1.24)
GFR calc Af Amer: 11 mL/min — ABNORMAL LOW (ref >60)
GFR calc non Af Amer: 9 mL/min — ABNORMAL LOW (ref >60)
GLUCOSE: 95 mg/dL (ref 65–99)
POTASSIUM: 4.6 mmol/L (ref 3.5–5.1)
Sodium: 144 mmol/L (ref 135–145)

## 2015-08-06 LAB — CBC
HEMATOCRIT: 36.6 % — AB (ref 39.0–52.0)
HEMOGLOBIN: 12 g/dL — AB (ref 13.0–17.0)
MCH: 30.5 pg (ref 26.0–34.0)
MCHC: 32.8 g/dL (ref 30.0–36.0)
MCV: 92.9 fL (ref 78.0–100.0)
PLATELETS: 123 10*3/uL — AB (ref 150–400)
RBC: 3.94 MIL/uL — AB (ref 4.22–5.81)
RDW: 17.9 % — AB (ref 11.5–15.5)
WBC: 6.9 10*3/uL (ref 4.0–10.5)

## 2015-08-06 LAB — TROPONIN I
TROPONIN I: 0.18 ng/mL — AB (ref <0.031)
TROPONIN I: 0.21 ng/mL — AB (ref <0.031)

## 2015-08-06 MED ORDER — SODIUM CHLORIDE 0.9 % IV SOLN
INTRAVENOUS | Status: AC
  Administered 2015-08-06 – 2015-08-07 (×2): via INTRAVENOUS

## 2015-08-06 MED ORDER — CARVEDILOL 12.5 MG PO TABS
12.5000 mg | ORAL_TABLET | Freq: Two times a day (BID) | ORAL | Status: DC
  Administered 2015-08-06 – 2015-08-07 (×2): 12.5 mg via ORAL
  Filled 2015-08-06 (×2): qty 1

## 2015-08-06 MED ORDER — NEPRO/CARBSTEADY PO LIQD
237.0000 mL | ORAL | Status: DC
  Administered 2015-08-07 – 2015-08-11 (×5): 237 mL via ORAL

## 2015-08-06 NOTE — Progress Notes (Signed)
PT Cancellation Note  Patient Details Name: [name] GammaClarence E Cilento MRN: [id number] DOB: 01/06/1933   Cancelled Treatment:    Reason Eval/Treat Not Completed: Medical issues which prohibited therapy Previous lab results indicate elevated troponin. Per departmental guidelines, will hold comprehensive PT evaluation at this time until troponin demonstrates a downward trend or patient is cleared by MD.  Berton MountBarbour, Rebekah Sprinkle S 08/06/2015, 8:15 AM Charlsie MerlesLogan Secor Idara Woodside, PT [phone]

## 2015-08-06 NOTE — Clinical Documentation Improvement (Signed)
Hospitalist  Can the diagnosis of CHF be further specified?    Acuity - Acute, Chronic, Acute on Chronic   Type - Systolic, Diastolic, Systolic and Diastolic  Other  Clinically Undetermined  Document any associated diagnoses/conditions  Supporting Information:(As per notes) "81 y.o. male with past medical history of CKD stage 4-5, dementia, CHF and hypertension"  " Lightheadedness  Unclear etiology, patient has same presentation before for acute on chronic CHF exacerbation."   .Please update your documentation within the medical record to reflect your response to this query.   Please exercise your independent, professional judgment when responding. A specific answer is not anticipated or expected.  Thank You, Nevin BloodgoodJoan B Laury Huizar, RN, BSN, CCDS,Clinical Documentation Specialist:  [phone]  [phone]=[facility] [place]- Health Information Management

## 2015-08-06 NOTE — Progress Notes (Signed)
Admission note:  Arrival Method: patient arrived on stretcher from ED Mental Orientation: Alert and oriented X 4 Telemetry: Telemetry box 6e13 applied. CCMD notified Assessment: completed see flow sheet  Skin: dry intact and was assessed by this RN and Feliciana RossettiLaura DeShazo, RN IV: rt hand, site clean dry and intact Pain: pt states no pain at this time. Tubes: NA  Safety Measures: Bed in lowest position, non skid socks placed, call light within reach Fall Prevention Safety Plan: reviewed with patient, pt is a moderate fall risk Admission Screening: completed see flowsheet 6700 Orientation: Patient has been oriented to the unit, staff and to the room. Pt lying in bed, orders reviewed and implemented , will continue to monitor

## 2015-08-06 NOTE — Progress Notes (Signed)
Initial Nutrition Assessment  DOCUMENTATION CODES:   Severe malnutrition in context of chronic illness  INTERVENTION:   Provide Nepro Shake po once daily, each supplement provides 425 kcal and 19 grams protein.  Encourage adequate PO intake.   NUTRITION DIAGNOSIS:   Malnutrition related to chronic illness as evidenced by severe depletion of body fat, severe depletion of muscle mass.  GOAL:   Patient will meet greater than or equal to 90% of their needs  MONITOR:   PO intake, Supplement acceptance, Weight trends, Labs, I & O's  REASON FOR ASSESSMENT:   Malnutrition Screening Tool    ASSESSMENT:   81 y.o. male with past medical history of CKD stage 4-5, dementia, CHF and hypertension. Patient presented with dizziness. In the ED was found to have creatinine worsening to 5.5, tachycardiac with lower extremity swelling, CT scan showed no acute events, CXR showed mild loculated effusion in the left side with cardiomegaly.  Pt reports having a good appetite currently and PTA with consumption of at least 2 meals a day with a protein shake once daily. Meal completion has been 100%. Weight has been stable. Pt does report however he used to weight ~200 lbs back in 2014. Weight loss not significant. Pt currently has Nepro Shake ordered. RD to continue with current orders. Nutrition-Focused physical exam completed. Findings are severe fat depletion, severe muscle depletion, and mild edema.   Labs and medications reviewed.   Diet Order:  Diet renal/carb modified with fluid restriction Diet-HS Snack?: Nothing; Room service appropriate?: Yes; Fluid consistency:: Thin  Skin:   (+1 LE edema)  Last BM:  10/11  Height:   Ht Readings from Last 1 Encounters:  08/05/15 5\' 10"  (1.778 m)    Weight:   Wt Readings from Last 1 Encounters:  08/05/15 140 lb 12.8 oz (63.866 kg)    Ideal Body Weight:  75.45 kg  BMI:  Body mass index is 20.2 kg/(m^2).  Estimated Nutritional Needs:   Kcal:   1900-2050  Protein:  80-90 grams  Fluid:  1.2 L/day  EDUCATION NEEDS:   No education needs identified at this time  Roslyn SmilingStephanie Finesse Fielder, MS, RD, LDN Pager # [phone] After hours/ weekend pager # [phone]

## 2015-08-06 NOTE — Progress Notes (Addendum)
[facility] PROGRESS NOTE  [name] GammaClarence E Tolan WUJ:[id number] DOB: 09/12/1933 DOA: 08/05/2015 PCP: [name], Mohan, MD  Length of stay: 1   Assessment/Plan: Principal Problem:   Dizziness Active Problems:   Chronic kidney disease (CKD), stage IV (severe) (HCC)   Pneumonia   Diabetes mellitus type 2, diet-controlled (HCC)   CAD (coronary artery disease)   Status post aortic valve replacement   Protein-calorie malnutrition, severe     Acute on chronic kidney disease stage IV Patient creatinine baseline was 4.5 about 2 years ago in May 2014. Presented with creatinine of 5.5, no significant improvement overnight after IV fluids. Has lower extremity edema, chest x-ray did not show convincing edema about left-sided effusion. Increased his Lasix and follow inpatient weight and input/output. We will consult nephrology in a.m.  Pneumonia Diagnosed with pneumonia in the ED, however CT scan does not show any evidence of pneumonia, no pleural effusion, no vascular congestion Continue empiric antibiotics for now as we cannot rule out aspiration, continue bronchodilators and mucolytic CT scan shows chronic left pleural thickening but no effusion If remains afebrile for another 24 hours we'll discontinue antibiotics  Lightheadedness, could be related to an underlying arrhythmia/orthostatic hypotension Tachycardic into the 120s, repeat EKG, continue to cycle cardiac enzymes, 2-D echo, continue telemetry PT/OT to evaluate and treat. Once cardiac etiology was ruled out CT head negative for acute CVA  Sinus Tachycardia with PVCs Could be likely secondary to mild fluid overload and cardiomegaly. Increase Coreg to 12.5 twice a day Increased dose of Coreg and continue amiodarone. Place patient on telemetry, 2-D echocardiogram and check 3 sets of cardiac enzymes. Patient is not hypoxic nor he has chest pain there is no suspicion for PE.  Hypertension controlled continue Coreg and  BiDil  Diabetes mellitus type 2 Diet-controlled, check hemoglobin A1c, carbohydrate modified diet.  Status post aortic valve replacement Bioprosthetic valve, no indication for anticoagulation.     DVT prophylaxsis heparin  Code Status:      Code Status Orders        Start     Ordered   08/05/15 1953  Full code   Continuous     08/05/15 1952     Family Communication: family updated about patient's clinical progress Disposition Plan: Anticipate discharge in 2-3 days    Brief narrative: [name] [name] is a 81 y.o. male with past medical history of CKD stage 4-5, dementia, CHF and hypertension. Patient presented with dizziness. Patient seen with his sister-in-law at bedside, mentioned that he have dizziness for the past 2 weeks comes and goes increase with ambulation, upon further questioning this dizziness is likely lightheadedness. Patient also was started to have some cough with clear sputum. Patient called his cardiologist today who asked him to come to the hospital for further evaluation. In the ED was found to have creatinine worsening to 5.5, tachycardiac with lower extremity swelling, CT scan showed no acute events, CXR showed mild loculated effusion in the left side with cardiomegaly. Patient admitted to the hospital for further evaluation.  Consultants:  None  Procedures:  None  Antibiotics: Anti-infectives    Start     Dose/Rate Route Frequency Ordered Stop   08/06/15 1600  cefTRIAXone (ROCEPHIN) 1 g in dextrose 5 % 50 mL IVPB     1 g 100 mL/hr over 30 Minutes Intravenous Every 24 hours 08/05/15 1952     08/05/15 2200  azithromycin (ZITHROMAX) 500 mg in dextrose 5 % 250 mL IVPB  500 mg 250 mL/hr over 60 Minutes Intravenous Every 24 hours 08/05/15 1952     08/05/15 1545  cefTRIAXone (ROCEPHIN) 1 g in dextrose 5 % 50 mL IVPB     1 g 100 mL/hr over 30 Minutes Intravenous  Once 08/05/15 1535 08/05/15 1751         HPI/Subjective: Patient resting  comfortably, confused denies dizziness, does not know why he is in the hospital  Objective: Filed Vitals:   08/05/15 1730 08/05/15 2003 08/06/15 0457 08/06/15 1000  BP: 155/103 151/118 129/91 146/96  Pulse: 128 130 124 101  Temp:  98 F (36.7 C) 97.6 F (36.4 C) 98.4 F (36.9 C)  TempSrc:  Oral Oral Oral  Resp: Height:      Weight:  63.866 kg (140 lb 12.8 oz)    SpO2: 96% 99% 98% 98%    Intake/Output Summary (Last 24 hours) at 08/06/15 1237 Last data filed at 08/06/15 1055  Gross per 24 hour  Intake 1551.67 ml  Output    500 ml  Net 1051.67 ml    Exam:  General: No acute respiratory distress Lungs: Clear to auscultation bilaterally without wheezes or crackles Cardiovascular: Regular rate and rhythm without murmur gallop or rub normal S1 and S2 Abdomen: Nontender, nondistended, soft, bowel sounds positive, no rebound, no ascites, no appreciable mass Extremities: No significant cyanosis, clubbing, or edema bilateral lower extremities     Data Review   Micro Results No results found for this or any previous visit (from the past 240 hour(s)).  Radiology Reports Dg Chest 2 View  08/05/2015  CLINICAL DATA:  Three-week history of weakness and dizziness. Cough. EXAM: CHEST  2 VIEW COMPARISON:  Mar 01, 2013 FINDINGS: There is mild loculated effusion at the left base. There is no edema or consolidation. Heart is mildly enlarged with pulmonary vascularity within normal limits. No adenopathy. Patient is status post aortic valve replacement and coronary artery bypass grafting. No adenopathy. There is degenerative change in the thoracic spine. IMPRESSION: Mild loculated effusion left base. No edema or consolidation. Stable cardiomegaly. Electronically Signed   By: Bretta Bang III M.D.   On: 08/05/2015 15:05   Ct Head Wo Contrast  08/05/2015  CLINICAL DATA:  Intermittent dizziness for 3 weeks, confusion. Decreased appetite, weight loss. Headache. EXAM: CT HEAD  WITHOUT CONTRAST TECHNIQUE: Contiguous axial images were obtained from the base of the skull through the vertex without intravenous contrast. COMPARISON:  None. FINDINGS: Old bilateral basal ganglia lacune or infarcts. Low-density throughout the deep white matter compatible with chronic small vessel disease. No acute infarction. No hemorrhage or hydrocephalus. No mass lesion or midline shift. No acute calvarial abnormality. Visualized paranasal sinuses and mastoids clear. Orbital soft tissues unremarkable. IMPRESSION: Old bilateral basal ganglia lacunar infarcts. No acute intracranial abnormality. Atrophy, chronic microvascular disease. Electronically Signed   By: Charlett Nose M.D.   On: 08/05/2015 16:59   Ct Chest Wo Contrast  08/05/2015  CLINICAL DATA:  81 year old male with cough, chills and possible left pleural effusion on recent chest radiograph EXAM: CT CHEST WITHOUT CONTRAST TECHNIQUE: Multidetector CT imaging of the chest was performed following the standard protocol without IV contrast. COMPARISON:  08/05/2015 and prior radiographs.  09/04/2006 CT FINDINGS: Mediastinum/Nodes: Cardiomegaly and aortic valve replacement again noted. Heavy coronary artery calcifications are identified. An ascending thoracic aortic aneurysm measuring 5 cm is unchanged. There is no evidence of pericardial effusion or enlarged lymph nodes. Lungs/Pleura: Unchanged left pleural thickening accounts for  the recent radiographic abnormality. There is no evidence of pleural effusion. Mild bibasilar scarring is present. Moderate centrilobular emphysema is identified. There is no evidence of airspace disease, suspicious nodule, consolidation or mass. There is no evidence of pneumothorax. Upper abdomen: No acute abnormalities. Musculoskeletal: No acute or suspicious abnormalities. Mild degenerative disc disease in the lower thoracic spine noted. IMPRESSION: No evidence of acute abnormality. Chronic left pleural thickening accounts for  abnormality identified on recent chest radiograph. No pleural effusion. Unchanged 5 cm ascending thoracic aortic aneurysm, cardiomegaly and coronary artery disease. Emphysema. Electronically Signed   By: Harmon Pier M.D.   On: 08/05/2015 20:54     CBC  Recent Labs Lab 08/05/15 1610 08/06/15 0650  WBC 7.3 6.9  HGB 13.8 12.0*  HCT 41.2 36.6*  PLT 130* 123*  MCV 91.8 92.9  MCH 30.7 30.5  MCHC 33.5 32.8  RDW 17.9* 17.9*    Chemistries   Recent Labs Lab 08/05/15 1610 08/06/15 0650  NA 140 144  K 4.9 4.6  CL 110 111  CO2 22 19*  GLUCOSE 125* 95  BUN 54* 61*  CREATININE 5.55* 5.30*  CALCIUM 9.0 8.8*   ------------------------------------------------------------------------------------------------------------------ estimated creatinine clearance is 10 mL/min (by C-G formula based on Cr of 5.3). ------------------------------------------------------------------------------------------------------------------ No results for input(s): HGBA1C in the last 72 hours. ------------------------------------------------------------------------------------------------------------------ No results for input(s): CHOL, HDL, LDLCALC, TRIG, CHOLHDL, LDLDIRECT in the last 72 hours. ------------------------------------------------------------------------------------------------------------------  Recent Labs  08/05/15 2110  TSH 1.610   ------------------------------------------------------------------------------------------------------------------ No results for input(s): VITAMINB12, FOLATE, FERRITIN, TIBC, IRON, RETICCTPCT in the last 72 hours.  Coagulation profile  Recent Labs Lab 08/05/15 2110  INR 1.18    No results for input(s): DDIMER in the last 72 hours.  Cardiac Enzymes  Recent Labs Lab 08/05/15 2110  TROPONINI 0.33*   ------------------------------------------------------------------------------------------------------------------ Invalid input(s):  POCBNP   CBG:  Recent Labs Lab 08/05/15 1445  GLUCAP 162*       Studies: Dg Chest 2 View  08/05/2015  CLINICAL DATA:  Three-week history of weakness and dizziness. Cough. EXAM: CHEST  2 VIEW COMPARISON:  Mar 01, 2013 FINDINGS: There is mild loculated effusion at the left base. There is no edema or consolidation. Heart is mildly enlarged with pulmonary vascularity within normal limits. No adenopathy. Patient is status post aortic valve replacement and coronary artery bypass grafting. No adenopathy. There is degenerative change in the thoracic spine. IMPRESSION: Mild loculated effusion left base. No edema or consolidation. Stable cardiomegaly. Electronically Signed   By: Bretta Bang III M.D.   On: 08/05/2015 15:05   Ct Head Wo Contrast  08/05/2015  CLINICAL DATA:  Intermittent dizziness for 3 weeks, confusion. Decreased appetite, weight loss. Headache. EXAM: CT HEAD WITHOUT CONTRAST TECHNIQUE: Contiguous axial images were obtained from the base of the skull through the vertex without intravenous contrast. COMPARISON:  None. FINDINGS: Old bilateral basal ganglia lacune or infarcts. Low-density throughout the deep white matter compatible with chronic small vessel disease. No acute infarction. No hemorrhage or hydrocephalus. No mass lesion or midline shift. No acute calvarial abnormality. Visualized paranasal sinuses and mastoids clear. Orbital soft tissues unremarkable. IMPRESSION: Old bilateral basal ganglia lacunar infarcts. No acute intracranial abnormality. Atrophy, chronic microvascular disease. Electronically Signed   By: Charlett Nose M.D.   On: 08/05/2015 16:59   Ct Chest Wo Contrast  08/05/2015  CLINICAL DATA:  81 year old male with cough, chills and possible left pleural effusion on recent chest radiograph EXAM: CT CHEST WITHOUT CONTRAST TECHNIQUE: Multidetector CT imaging of the  chest was performed following the standard protocol without IV contrast. COMPARISON:  08/05/2015 and  prior radiographs.  09/04/2006 CT FINDINGS: Mediastinum/Nodes: Cardiomegaly and aortic valve replacement again noted. Heavy coronary artery calcifications are identified. An ascending thoracic aortic aneurysm measuring 5 cm is unchanged. There is no evidence of pericardial effusion or enlarged lymph nodes. Lungs/Pleura: Unchanged left pleural thickening accounts for the recent radiographic abnormality. There is no evidence of pleural effusion. Mild bibasilar scarring is present. Moderate centrilobular emphysema is identified. There is no evidence of airspace disease, suspicious nodule, consolidation or mass. There is no evidence of pneumothorax. Upper abdomen: No acute abnormalities. Musculoskeletal: No acute or suspicious abnormalities. Mild degenerative disc disease in the lower thoracic spine noted. IMPRESSION: No evidence of acute abnormality. Chronic left pleural thickening accounts for abnormality identified on recent chest radiograph. No pleural effusion. Unchanged 5 cm ascending thoracic aortic aneurysm, cardiomegaly and coronary artery disease. Emphysema. Electronically Signed   By: Harmon Pier M.D.   On: 08/05/2015 20:54      Lab Results  Component Value Date   HGBA1C 5.9* 03/30/2012   Lab Results  Component Value Date   CREATININE 5.30* 08/06/2015       Scheduled Meds: . amiodarone  200 mg Oral Daily  . azithromycin  500 mg Intravenous Q24H  . calcitRIOL  0.25 mcg Oral Daily  . carvedilol  12.5 mg Oral BID WC  . cefTRIAXone (ROCEPHIN)  IV  1 g Intravenous Q24H  . donepezil  5 mg Oral QHS  . [START ON 08/07/2015] feeding supplement (NEPRO CARB STEADY)  237 mL Oral Q24H  . furosemide  60 mg Intravenous Q12H  . heparin  5,000 Units Subcutaneous 3 times per day  . isosorbide-hydrALAZINE  1 tablet Oral TID  . sodium chloride  3 mL Intravenous Q12H   Continuous Infusions: . sodium chloride 10 mL/hr at 08/05/15 2251    Principal Problem:   Dizziness Active Problems:   Chronic  kidney disease (CKD), stage IV (severe) (HCC)   Pneumonia   Diabetes mellitus type 2, diet-controlled (HCC)   CAD (coronary artery disease)   Status post aortic valve replacement   Protein-calorie malnutrition, severe    Time spent: 45 minutes   [facility]  [facility] Pager [phone]. If 7PM-7AM, please contact night-coverage at [email or web address], password [facility] 08/06/2015, 12:37 PM  LOS: 1 day

## 2015-08-06 NOTE — Progress Notes (Signed)
  Echocardiogram 2D Echocardiogram has been performed.  Arvil ChacoFoster, Marykate Heuberger 08/06/2015, 2:11 PM

## 2015-08-06 NOTE — Progress Notes (Signed)
OT Cancellation Note  Patient Details Name: [name] [name] MRN: [id number] DOB: 12/13/1932   Cancelled Treatment:    Reason Eval/Treat Not Completed: Medical issues which prohibited therapy. Pt with elevated troponin. Will follow and await downward trend.  Evern BioMayberry, Charnita Trudel Lynn 08/06/2015, 9:18 AM  [phone]

## 2015-08-07 ENCOUNTER — Inpatient Hospital Stay (HOSPITAL_COMMUNITY): Payer: Medicare Other

## 2015-08-07 LAB — COMPREHENSIVE METABOLIC PANEL
ALK PHOS: 40 U/L (ref 38–126)
ALT: 13 U/L — ABNORMAL LOW (ref 17–63)
ANION GAP: 12 (ref 5–15)
AST: 17 U/L (ref 15–41)
Albumin: 3.3 g/dL — ABNORMAL LOW (ref 3.5–5.0)
BILIRUBIN TOTAL: 0.8 mg/dL (ref 0.3–1.2)
BUN: 66 mg/dL — ABNORMAL HIGH (ref 6–20)
CALCIUM: 8.2 mg/dL — AB (ref 8.9–10.3)
CO2: 16 mmol/L — ABNORMAL LOW (ref 22–32)
Chloride: 108 mmol/L (ref 101–111)
Creatinine, Ser: 5.64 mg/dL — ABNORMAL HIGH (ref 0.61–1.24)
GFR calc non Af Amer: 9 mL/min — ABNORMAL LOW (ref >60)
GFR, EST AFRICAN AMERICAN: 10 mL/min — AB (ref >60)
Glucose, Bld: 115 mg/dL — ABNORMAL HIGH (ref 65–99)
Potassium: 4.4 mmol/L (ref 3.5–5.1)
Sodium: 136 mmol/L (ref 135–145)
TOTAL PROTEIN: 6.3 g/dL — AB (ref 6.5–8.1)

## 2015-08-07 LAB — CBC
HEMATOCRIT: 37.2 % — AB (ref 39.0–52.0)
HEMOGLOBIN: 12.5 g/dL — AB (ref 13.0–17.0)
MCH: 30.6 pg (ref 26.0–34.0)
MCHC: 33.6 g/dL (ref 30.0–36.0)
MCV: 91.2 fL (ref 78.0–100.0)
Platelets: 124 10*3/uL — ABNORMAL LOW (ref 150–400)
RBC: 4.08 MIL/uL — ABNORMAL LOW (ref 4.22–5.81)
RDW: 17.8 % — ABNORMAL HIGH (ref 11.5–15.5)
WBC: 6.9 10*3/uL (ref 4.0–10.5)

## 2015-08-07 LAB — SODIUM, URINE, RANDOM: SODIUM UR: 98 mmol/L

## 2015-08-07 LAB — CORTISOL: CORTISOL PLASMA: 16.9 ug/dL

## 2015-08-07 LAB — HEMOGLOBIN A1C
Hgb A1c MFr Bld: 5.5 % (ref 4.8–5.6)
Mean Plasma Glucose: 111 mg/dL

## 2015-08-07 LAB — HEPARIN LEVEL (UNFRACTIONATED): Heparin Unfractionated: 0.9 IU/mL — ABNORMAL HIGH (ref 0.30–0.70)

## 2015-08-07 LAB — TSH: TSH: 0.918 u[IU]/mL (ref 0.350–4.500)

## 2015-08-07 LAB — CREATININE, URINE, RANDOM: Creatinine, Urine: 54.18 mg/dL

## 2015-08-07 MED ORDER — DIGOXIN 0.25 MG/ML IJ SOLN
0.2500 mg | Freq: Once | INTRAMUSCULAR | Status: AC
  Administered 2015-08-07: 0.25 mg via INTRAVENOUS
  Filled 2015-08-07: qty 1

## 2015-08-07 MED ORDER — SODIUM CHLORIDE 0.9 % IV BOLUS (SEPSIS)
500.0000 mL | Freq: Once | INTRAVENOUS | Status: AC
  Administered 2015-08-07: 500 mL via INTRAVENOUS

## 2015-08-07 MED ORDER — TECHNETIUM TO 99M ALBUMIN AGGREGATED
6.0000 | Freq: Once | INTRAVENOUS | Status: AC | PRN
  Administered 2015-08-07: 6 via INTRAVENOUS

## 2015-08-07 MED ORDER — WARFARIN SODIUM 2.5 MG PO TABS
2.5000 mg | ORAL_TABLET | Freq: Once | ORAL | Status: AC
  Administered 2015-08-07: 2.5 mg via ORAL
  Filled 2015-08-07: qty 1

## 2015-08-07 MED ORDER — WARFARIN - PHARMACIST DOSING INPATIENT
Freq: Every day | Status: DC
  Administered 2015-08-07 – 2015-08-12 (×2)

## 2015-08-07 MED ORDER — SODIUM CHLORIDE 0.9 % IV SOLN
INTRAVENOUS | Status: DC
  Administered 2015-08-07 (×2): via INTRAVENOUS

## 2015-08-07 MED ORDER — HEPARIN (PORCINE) IN NACL 100-0.45 UNIT/ML-% IJ SOLN
750.0000 [IU]/h | INTRAMUSCULAR | Status: DC
  Administered 2015-08-07: 900 [IU]/h via INTRAVENOUS
  Administered 2015-08-08: 650 [IU]/h via INTRAVENOUS
  Administered 2015-08-10 – 2015-08-11 (×2): 750 [IU]/h via INTRAVENOUS
  Filled 2015-08-07 (×4): qty 250

## 2015-08-07 MED ORDER — CARVEDILOL 3.125 MG PO TABS
3.1250 mg | ORAL_TABLET | Freq: Two times a day (BID) | ORAL | Status: DC
  Administered 2015-08-07: 3.125 mg via ORAL
  Filled 2015-08-07 (×2): qty 1

## 2015-08-07 MED ORDER — HEPARIN BOLUS VIA INFUSION
3000.0000 [IU] | Freq: Once | INTRAVENOUS | Status: AC
  Administered 2015-08-07: 3000 [IU] via INTRAVENOUS
  Filled 2015-08-07: qty 3000

## 2015-08-07 MED ORDER — AZITHROMYCIN 500 MG PO TABS: 500.0000 mg | ORAL_TABLET | Freq: Every day | ORAL | Status: DC

## 2015-08-07 MED ORDER — AMIODARONE HCL 200 MG PO TABS
200.0000 mg | ORAL_TABLET | Freq: Two times a day (BID) | ORAL | Status: DC
  Administered 2015-08-07 – 2015-08-09 (×5): 200 mg via ORAL
  Filled 2015-08-07 (×4): qty 1

## 2015-08-07 NOTE — Evaluation (Signed)
Occupational Therapy Evaluation Patient Details Name: [name] GammaClarence E [name] MRN: [id number] DOB: 04/28/1933 Today's Date: 08/07/2015    History of Present Illness 81 yo male admitted from home alone with dizziness. pt in ED found to have creatinine worsening to 5.5 and tachycardia.  PMH: dementia, CKD IV-V, CHF , HTN   Clinical Impression   PT admitted with dizziness and tachycardia. Pt currently with functional limitiations due to the deficits listed below (see OT problem list). PTA living at home alone with (A) for medications / money management. Pt will benefit from skilled OT to increase their independence and safety with adls and balance to allow discharge SNF. Pt demonstrates incontinence and cognitive deficits. Pt is unsafe to return home alone at this time. Pt 's HR 117 with basic transfer this session.      Follow Up Recommendations  SNF    Equipment Recommendations  3 in 1 bedside comode;Other (comment) (RW)    Recommendations for Other Services       Precautions / Restrictions Precautions Precautions: Fall      Mobility Bed Mobility Overal bed mobility: Modified Independent             General bed mobility comments: incr time and use of rail  Transfers Overall transfer level: Needs assistance Equipment used: Rolling walker (2 wheeled) Transfers: Sit to/from Stand Sit to Stand: Min guard         General transfer comment: cues for hand placement and use of RW    Balance Overall balance assessment: Needs assistance Sitting-balance support: Bilateral upper extremity supported;Feet supported Sitting balance-Leahy Scale: Fair     Standing balance support: Bilateral upper extremity supported;During functional activity Standing balance-Leahy Scale: Fair                              ADL Overall ADL's : Needs assistance/impaired Eating/Feeding: Minimal assistance;Sitting Eating/Feeding Details (indicate cue type and reason): pt with breakfast  in bed and no initiation to eat meal. pt positioned in chair with meal cut into bit sizes. pt immediatly initiates self feeding and states "hmm this is good!" pt's response to feeding chnaged with position to simulate more home like setup. Recommend OOB for all meals and setup of tray for eating by staff  Grooming: Wash/dry hands;Wash/dry face;Set up;Sitting Grooming Details (indicate cue type and reason): pt provided wash cloth for peri care and verbal cue "here is a wash cloth to help you clean up" pt states "oh good! " and begins hygiene grooming task and completely ignores LB     Lower Body Bathing: Total assistance Lower Body Bathing Details (indicate cue type and reason): due to cognitive deficits         Toilet Transfer: Moderate assistance;Ambulation Toilet Transfer Details (indicate cue type and reason): walking outside RW and incontinence on the way to toilet transfer. when pt stated "i need to use the bathroom" pt is already voiding Toileting- Clothing Manipulation and Hygiene: Moderate assistance       Functional mobility during ADLs: Moderate assistance;Rolling walker General ADL Comments: Pt with cogntiive deficits affecting all aspects of adls and unsafe to d/c home at this time alone     Vision     Perception     Praxis      Pertinent Vitals/Pain Pain Assessment: No/denies pain     Hand Dominance Right   Extremity/Trunk Assessment Upper Extremity Assessment Upper Extremity Assessment: Generalized weakness   Lower Extremity Assessment Lower  Extremity Assessment: Defer to PT evaluation   Cervical / Trunk Assessment Cervical / Trunk Assessment: Normal   Communication Communication Communication: No difficulties (did not rattle to breathing sounds)   Cognition Arousal/Alertness: Awake/alert Behavior During Therapy: WFL for tasks assessed/performed Overall Cognitive Status: Impaired/Different from baseline Area of Impairment:  Orientation;Attention;Memory;Following commands;Safety/judgement;Awareness;Problem solving Orientation Level: Disoriented to;Place;Time;Situation Current Attention Level: Sustained Memory: Decreased recall of precautions;Decreased short-term memory Following Commands: Follows one step commands with increased time Safety/Judgement: Decreased awareness of safety;Decreased awareness of deficits Awareness: Intellectual Problem Solving: Slow processing;Decreased initiation General Comments: Pt reports "we are in a 5 story building in [place]" Pt provided 3 choices - Engineering geologist, hospital fire department. Pt states "no no its a 5 story building" Pt reports month as June.    General Comments       Exercises       Shoulder Instructions      Home Living Family/patient expects to be discharged to:: Private residence Living Arrangements: Alone Available Help at Discharge: Available PRN/intermittently;Family Type of Home: House Home Access: Stairs to enter [employer] of Steps: 4 Entrance Stairs-Rails: None Home Layout: One level     Bathroom Shower/Tub: Producer, television/film/video: Standard     Home Equipment: None          Prior Functioning/Environment Level of Independence: Independent        Comments: pt drives and attends church weekly. Pt s sister in law POA arriving at the end of evaluation. She reports "he has been hallunicating. He has accidents all the time and can't make it to the bathroom in time. He has a pill box I have been fixing and he doesnt take the medication." POA reports that patient stated come over here and look I already did my pills and she reports they were 100% correct in the box but he didnt take them like the box is suppose to be scheduled. patients brother in law also assisting with care to help with decr burden of care. Family reports this decline has been rapid over the last 3 weeks    OT Diagnosis: Generalized weakness;Cognitive  deficits   OT Problem List: Decreased strength;Decreased activity tolerance;Impaired balance (sitting and/or standing);Decreased cognition;Decreased safety awareness;Decreased knowledge of use of DME or AE;Decreased knowledge of precautions   OT Treatment/Interventions: Self-care/ADL training;Therapeutic exercise;DME and/or AE instruction;Therapeutic activities;Cognitive remediation/compensation;Patient/family education;Balance training    OT Goals(Current goals can be found in the care plan section) Acute Rehab OT Goals Patient Stated Goal: none stated - unaware of hospitalization OT Goal Formulation: With patient/family Time For Goal Achievement: 08/21/15 Potential to Achieve Goals: Good  OT Frequency: Min 2X/week   Barriers to D/C: Decreased caregiver support  lives alone and PRN brother in law and sister in law assistance. Sister in law is POA - provided money for tids for church and pill box medication arrangement       Co-evaluation              End of Session Equipment Utilized During Treatment: Rolling walker;Gait belt Nurse Communication: Mobility status;Precautions  Activity Tolerance: Patient tolerated treatment well Patient left: in chair;with call bell/phone within reach;with chair alarm set   Time: [phone] OT Time Calculation (min): 19 min Charges:  OT General Charges $OT Visit: 1 Procedure OT Evaluation $Initial OT Evaluation Tier I: 1 Procedure G-Codes:    Boone Master B 08/29/2015, 10:17 AM  Mateo Flow   OTR/L Pager: [phone] Office: [phone] .

## 2015-08-07 NOTE — Care Management Important Message (Signed)
Important Message  Patient Details  Name: [name] [name] MRN: [id number] Date of Birth: 11/02/1932   Medicare Important Message Given:  Yes-second notification given    Orson AloeMegan P Lindley Hiney 08/07/2015, 11:09 AM

## 2015-08-07 NOTE — Consult Note (Signed)
Reason for Consult: AKI/CKD Referring Physician: Susie Cassette, MD  [name] [name] is an 81 y.o. male.  HPI: Pt is an 81 yo AAM with PMH sig for CAD, CHF, HTN, AS s/p AVR, CKD stage 4-5 (baseline Scr 4-4.5), and dementia who was admitted with AMS on 08/05/15.  His sister-in-law, who is also HPOA stated that they could not wake him up when they went to his house the day of admission.  He was having hallucinations, anorexia, as well as dizziness.  He was found to have new-onset A Fib as well as edema and was admitted on 08/05/15 for further evaluation.  We have been asked to see the patient due to a bump in his Scr.  [name] [name] is well known to me from outpatient follow up over the last several years and he has had slowly progressive CKD but has been reluctant to consider hemodialysis due to his desire to stay home and independent.  He also did not feel he was strong enough for dialysis, which his sister-in-law agrees with.  They have spoken about dialysis and neither wanted to pursue renal replacement therapy if needed.  She knows he has not been eating or drinking much and also notes that his demential has been worsening.  Trend in Creatinine: CREATININE, SER  Date/Time Value Ref Range Status  08/07/2015 05:55 AM 5.64* 0.61 - 1.24 mg/dL Final  81/19/1478 29:56 AM 5.30* 0.61 - 1.24 mg/dL Final  21/30/8657 84:69 PM 5.55* 0.61 - 1.24 mg/dL Final  62/95/2841 32:44 AM 4.51* 0.50 - 1.35 mg/dL Final  10/26/7251 66:44 AM 4.71* 0.50 - 1.35 mg/dL Final  03/47/4259 56:38 AM 4.49* 0.50 - 1.35 mg/dL Final  75/64/3329 51:88 AM 3.61* 0.50 - 1.35 mg/dL Final  41/66/0630 16:01 AM 3.16* 0.50 - 1.35 mg/dL Final  09/32/3557 32:20 AM 2.99* 0.50 - 1.35 mg/dL Final  25/42/7062 37:62 AM 3.33* 0.50 - 1.35 mg/dL Final  83/15/1761 60:73 AM 3.16* 0.50 - 1.35 mg/dL Final  71/03/2693 85:46 AM 2.91* 0.50 - 1.35 mg/dL Final  27/12/5007 38:18 AM 2.89* 0.50 - 1.35 mg/dL Final  29/93/7169 67:89 AM 2.72* 0.50 - 1.35 mg/dL Final   38/07/1750 02:58 AM 2.79* 0.50 - 1.35 mg/dL Final  52/77/8242 35:36 PM 2.60* 0.50 - 1.35 mg/dL Final    PMH:   Past Medical History  Diagnosis Date  . Hypertension   . Renal disorder     renal failure  . CHF (congestive heart failure) (HCC)   . Dementia     PSH:   Past Surgical History  Procedure Laterality Date  . Cardiac surgery    . Cardiac valve surgery    . Femoral artery stent      right leg per pt    Allergies:  Allergies  Allergen Reactions  . Clams [Shellfish Allergy]     Gout     Medications:   Prior to Admission medications   Medication Sig Start Date End Date Taking? Authorizing Provider  amiodarone (PACERONE) 200 MG tablet Take 1 tablet (200 mg total) by mouth daily. 03/09/13  Yes Rinaldo Cloud, MD  calcitRIOL (ROCALTROL) 0.25 MCG capsule Take 0.25 mcg by mouth daily.  02/05/15  Yes Historical Provider, MD  carvedilol (COREG) 3.125 MG tablet Take 3.125 mg by mouth 2 (two) times daily with a meal.   Yes Historical Provider, MD  donepezil (ARICEPT) 5 MG tablet Take 5 mg by mouth at bedtime.   Yes Historical Provider, MD  furosemide (LASIX) 40 MG tablet Take 2 tablets (80  mg total) by mouth 2 (two) times daily. Patient taking differently: Take 40 mg by mouth 2 (two) times daily.  04/05/12  Yes Rinaldo Cloud, MD  isosorbide-hydrALAZINE (BIDIL) 20-37.5 MG per tablet Take 1 tablet by mouth 3 (three) times daily.   Yes Historical Provider, MD  carvedilol (COREG) 3.125 MG tablet Take 1 tablet (3.125 mg total) by mouth every 12 (twelve) hours. 04/05/12 04/05/13  Rinaldo Cloud, MD  memantine (NAMENDA) 5 MG tablet Take 1 tablet (5 mg total) by mouth daily. Patient not taking: Reported on 08/05/2015 03/09/13   Rinaldo Cloud, MD    Inpatient medications: . amiodarone  200 mg Oral Daily  . azithromycin  500 mg Oral QHS  . calcitRIOL  0.25 mcg Oral Daily  . carvedilol  12.5 mg Oral BID WC  . cefTRIAXone (ROCEPHIN)  IV  1 g Intravenous Q24H  . donepezil  5 mg Oral QHS  .  feeding supplement (NEPRO CARB STEADY)  237 mL Oral Q24H  . furosemide  60 mg Intravenous Q12H  . heparin  3,000 Units Intravenous Once  . isosorbide-hydrALAZINE  1 tablet Oral TID  . sodium chloride  3 mL Intravenous Q12H    Discontinued Meds:   Medications Discontinued During This Encounter  Medication Reason  . amoxicillin (AMOXIL) 500 MG capsule Completed Course  . potassium chloride (K-DUR,KLOR-CON) 10 MEQ tablet Expired Prescription  . feeding supplement (NEPRO CARB STEADY) liquid 237 mL   . carvedilol (COREG) tablet 3.125 mg   . 0.9 %  sodium chloride infusion   . azithromycin (ZITHROMAX) 500 mg in dextrose 5 % 250 mL IVPB   . heparin injection 5,000 Units     Social History:  reports that he quit smoking about 32 years ago. His smoking use included Cigarettes. He has a 3.75 pack-year smoking history. He has never used smokeless tobacco. He reports that he does not drink alcohol or use illicit drugs.  Family History:  History reviewed. No pertinent family history.  Pertinent items are noted in HPI. Weight change: 1.588 kg (3 lb 8 oz)  Intake/Output Summary (Last 24 hours) at 08/07/15 1026 Last data filed at 08/07/15 0935  Gross per 24 hour  Intake 1783.75 ml  Output    875 ml  Net 908.75 ml   BP 135/86 mmHg  Pulse 105  Temp(Src) 98.5 F (36.9 C) (Oral)  Resp 18  Ht 5\' 10"  (1.778 m)  Wt 65.454 kg (144 lb 4.8 oz)  BMI 20.70 kg/m2  SpO2 99% Filed Vitals:   08/06/15 1531 08/06/15 2145 08/07/15 0608 08/07/15 0901  BP: 125/62 143/96 135/93 135/86  Pulse: 120 115 113 105  Temp:  98.2 F (36.8 C) 98.4 F (36.9 C) 98.5 F (36.9 C)  TempSrc:  Oral Oral Oral  Resp:  19 18 18   Height:      Weight:  65.454 kg (144 lb 4.8 oz)    SpO2:  97% 97% 99%     General appearance: fatigued and no distress Head: Normocephalic, without obvious abnormality, atraumatic Eyes: negative findings: lids and lashes normal, conjunctivae and sclerae normal and corneas clear Resp:  occassional rhonchi bilaterally Cardio: irregularly irregular rhythm, mid systolic click present and no rub GI: soft, non-tender; bowel sounds normal; no masses,  no organomegaly Extremities: edema 1+ pretibial   Labs: Basic Metabolic Panel:  Recent Labs Lab 08/05/15 1610 08/06/15 0650 08/07/15 0555  NA 140 144 136  K 4.9 4.6 4.4  CL 110 111 108  CO2 22 19* 16*  GLUCOSE 125* 95 115*  BUN 54* 61* 66*  CREATININE 5.55* 5.30* 5.64*  ALBUMIN  --   --  3.3*  CALCIUM 9.0 8.8* 8.2*   Liver Function Tests:  Recent Labs Lab 08/07/15 0555  AST 17  ALT 13*  ALKPHOS 40  BILITOT 0.8  PROT 6.3*  ALBUMIN 3.3*   No results for input(s): LIPASE, AMYLASE in the last 168 hours. No results for input(s): AMMONIA in the last 168 hours. CBC:  Recent Labs Lab 08/05/15 1610 08/06/15 0650 08/07/15 0555  WBC 7.3 6.9 6.9  HGB 13.8 12.0* 12.5*  HCT 41.2 36.6* 37.2*  MCV 91.8 92.9 91.2  PLT 130* 123* 124*   PT/INR: @LABRCNTIP (inr:5) Cardiac Enzymes: ) Recent Labs Lab 08/05/15 2110 08/06/15 1326 08/06/15 1829  TROPONINI 0.33* 0.18* 0.21*   CBG:  Recent Labs Lab 08/05/15 1445  GLUCAP 162*    Iron Studies: No results for input(s): IRON, TIBC, TRANSFERRIN, FERRITIN in the last 168 hours.  Xrays/Other Studies: Dg Chest 1 View  08/06/2015  CLINICAL DATA:  Dyspnea. EXAM: CHEST 1 VIEW COMPARISON:  CT of the chest 08/05/2015 FINDINGS: Postsurgical changes from prior aortic valve replacement and probable CABG are stable. Cardiomediastinal silhouette is stably enlarged. Mediastinal contours appear intact. There is no evidence of focal airspace consolidation or pneumothorax. There is persistent left pleural thickening versus effusion. Osseous structures are without acute abnormality. Extensive osteoarthritic changes of bilateral glenohumeral joints are seen. Soft tissues are grossly normal. IMPRESSION: Stable cardiomegaly. Stable left pleural thickening versus effusion. Electronically  Signed   By: Ted Mcalpineobrinka  Dimitrova M.D.   On: 08/06/2015 17:38   Dg Chest 2 View  08/05/2015  CLINICAL DATA:  Three-week history of weakness and dizziness. Cough. EXAM: CHEST  2 VIEW COMPARISON:  Mar 01, 2013 FINDINGS: There is mild loculated effusion at the left base. There is no edema or consolidation. Heart is mildly enlarged with pulmonary vascularity within normal limits. No adenopathy. Patient is status post aortic valve replacement and coronary artery bypass grafting. No adenopathy. There is degenerative change in the thoracic spine. IMPRESSION: Mild loculated effusion left base. No edema or consolidation. Stable cardiomegaly. Electronically Signed   By: Bretta BangWilliam  Woodruff III M.D.   On: 08/05/2015 15:05   Ct Head Wo Contrast  08/05/2015  CLINICAL DATA:  Intermittent dizziness for 3 weeks, confusion. Decreased appetite, weight loss. Headache. EXAM: CT HEAD WITHOUT CONTRAST TECHNIQUE: Contiguous axial images were obtained from the base of the skull through the vertex without intravenous contrast. COMPARISON:  None. FINDINGS: Old bilateral basal ganglia lacune or infarcts. Low-density throughout the deep white matter compatible with chronic small vessel disease. No acute infarction. No hemorrhage or hydrocephalus. No mass lesion or midline shift. No acute calvarial abnormality. Visualized paranasal sinuses and mastoids clear. Orbital soft tissues unremarkable. IMPRESSION: Old bilateral basal ganglia lacunar infarcts. No acute intracranial abnormality. Atrophy, chronic microvascular disease. Electronically Signed   By: Charlett NoseKevin  Dover M.D.   On: 08/05/2015 16:59   Ct Chest Wo Contrast  08/05/2015  CLINICAL DATA:  81 year old male with cough, chills and possible left pleural effusion on recent chest radiograph EXAM: CT CHEST WITHOUT CONTRAST TECHNIQUE: Multidetector CT imaging of the chest was performed following the standard protocol without IV contrast. COMPARISON:  08/05/2015 and prior radiographs.   09/04/2006 CT FINDINGS: Mediastinum/Nodes: Cardiomegaly and aortic valve replacement again noted. Heavy coronary artery calcifications are identified. An ascending thoracic aortic aneurysm measuring 5 cm is unchanged. There is no evidence of pericardial effusion or enlarged lymph nodes.  Lungs/Pleura: Unchanged left pleural thickening accounts for the recent radiographic abnormality. There is no evidence of pleural effusion. Mild bibasilar scarring is present. Moderate centrilobular emphysema is identified. There is no evidence of airspace disease, suspicious nodule, consolidation or mass. There is no evidence of pneumothorax. Upper abdomen: No acute abnormalities. Musculoskeletal: No acute or suspicious abnormalities. Mild degenerative disc disease in the lower thoracic spine noted. IMPRESSION: No evidence of acute abnormality. Chronic left pleural thickening accounts for abnormality identified on recent chest radiograph. No pleural effusion. Unchanged 5 cm ascending thoracic aortic aneurysm, cardiomegaly and coronary artery disease. Emphysema. Electronically Signed   By: Harmon Pier M.D.   On: 08/05/2015 20:54   Mr Brain Wo Contrast  08/06/2015  CLINICAL DATA:  81 year old hypertensive male with dementia and chronic kidney disease presenting with dizziness. Subsequent encounter. EXAM: MRI HEAD WITHOUT CONTRAST TECHNIQUE: Multiplanar, multiecho pulse sequences of the brain and surrounding structures were obtained without intravenous contrast. COMPARISON:  08/05/2015 head CT. FINDINGS: No acute infarct. Remote bilateral centrum semiovale and basal ganglia infarcts. Remote left cerebellar infarct. Small vessel disease type changes. Blood breakdown products posterior left corona radiata and left occipital lobe probably related to prior episodes of hemorrhagic ischemia. Moderate global atrophy without hydrocephalus. No intracranial mass lesion noted on this unenhanced exam. Ectatic patent major intracranial arterial  structures. Cervical medullary junction, pituitary region, pineal region and orbital structures unremarkable. IMPRESSION: No acute infarct. Remote bilateral centrum semiovale and basal ganglia infarcts. Remote left cerebellar infarct. Small vessel disease type changes. Moderate global atrophy without hydrocephalus. Electronically Signed   By: Lacy Duverney M.D.   On: 08/06/2015 21:52   Nm Pulmonary Perf And Vent  08/07/2015  CLINICAL DATA:  Shortness breath.  Dizziness for 2 weeks. EXAM: NUCLEAR MEDICINE VENTILATION - PERFUSION LUNG SCAN TECHNIQUE: Ventilation images were obtained in multiple projections using inhaled aerosol Tc-78m DTPA. Perfusion images were obtained in multiple projections after intravenous injection of Tc-7m MAA. RADIOPHARMACEUTICALS:  40 Technetium-35m DTPA aerosol inhalation and 6.0 Technetium-44m MAA IV COMPARISON:  One-view chest x-ray from the same day. FINDINGS: Ventilation: There is absent undulation in the left lower lobe. There is also segmental defect in the superior segment of the left upper lobe. The right lung uptake is normal. Perfusion: Absent per fusion is matched in the left lower lobe. A matched defect is present in the superior segment of the left upper lobe. The right lung perfuses normally. There is some clumping of radiopharmaceutical at the hilum. IMPRESSION: 1. Matched left lower lobe and left upper lobe defects. This represents a very low probability (0 - 9%) of pulmonary embolus using Pioped II criteria. Electronically Signed   By: Marin Roberts M.D.   On: 08/07/2015 10:22     Assessment/Plan: 1.  AKI/CKD stage 4-5- in setting of poor po intake and new onset Atrial fibrillation.   I had a lengthy discussion with [name] [name] sister-in-law.  We are both in agreement that [name] [name] has not been interested in renal replacement therapy of any kind and that if his kidney function were to continue to deteriorate, she would be amenable to  hospice/palliative care evaluation.  For now would hold off on diuretics and check FeNa.   2. New onset Atrial Fib- awaiting input from his cardiologist 3. HTN- stable 4. Anemia of chronic disease- stable not on ESA 5. CHF- possibly related to new A fib.  Await cardiology input. 6. AMS- pulled out his IV's earlier.  Possibly related to dementia vs. delerium from acute  illness.  Also need to r/o infection. 7. Protein malnutrition- renal diet 8. Disposition- unclear if he is going to be able to remain independent.  PT/OT eval.   Roosevelt Eimers A 08/07/2015, 10:26 AM

## 2015-08-07 NOTE — Progress Notes (Signed)
Patient is due for some IV medications including heparin gtt- unable to replace IV. Team called- unable to replace. Sending up another member of IV team to try.

## 2015-08-07 NOTE — Progress Notes (Addendum)
[facility] PROGRESS NOTE  [name] [name] ZOX:[id number] DOB: [birth year] DOA: 08/05/2015 PCP: Rinaldo Cloud, MD  Length of stay: 2   Assessment/Plan: Principal Problem:   Dizziness Active Problems:   Chronic kidney disease (CKD), stage IV (severe) (HCC)   Pneumonia   Diabetes mellitus type 2, diet-controlled (HCC)   CAD (coronary artery disease)   Status post aortic valve replacement   Protein-calorie malnutrition, severe     Acute on chronic kidney disease stage V Patient creatinine baseline was 4.5 about 2 years ago in May 2014. Patient followed by Dr.Colodonato Creatinine around 5.5, most likely the patient's new baseline, nephrology consulted Because of dementia  Nephrology does  not recommend any dialysis at this time , no significant improvement overnight after IV fluids. Discontinue Lasix  and  continue gentle IV fluids   Probable community-acquired pneumonia Diagnosed with pneumonia in the ED, however CT scan does not show any evidence of pneumonia, no pleural effusion, no vascular congestion Concern for aspiration, however patient is not hypoxic and appears comfortable discontinue antibiotics Speech therapy consultation will be obtained   continue bronchodilators and mucolytic  Chronic systolic/diastolic heart failure, does not appear to be in acute exacerbation   EF 45-50% on repeat echo, Lasix discontinued given low BP Hold bidil  Lightheadedness, could be related to an underlying arrhythmia , probable new onset atrial fibrillation Cardiology Dr. Particia Jasper has been consulted Tachycardic into the 120s, EKG shows atrial fibrillation from 10/13, repeat EKG today for comparison, continue to cycle cardiac enzymes, 2-D echo shows EF of 45-50%, continue telemetry PT/OT to evaluate and treat. Once cardiac etiology was ruled out MRI of the brain negative for acute CVA but showed old infarcts  Sinus Tachycardia/probable A. fib with PVCs Reduce  Coreg to  3.125 twice a day due to soft BP, and cont  amiodarone. VQ scan negative for PE Cardiology recommends anticoagulation with heparin drip/Coumadin  Hypertension controlled continue Coreg and hold  BiDil  Diabetes mellitus type 2 Diet-controlled, hemoglobin A1c  5.5, carbohydrate modified diet.  Status post aortic valve replacement Bioprosthetic valve, no indication for anticoagulation.     DVT prophylaxsis heparin  Code Status:      Code Status Orders        Start     Ordered   08/05/15 1953  Full code   Continuous     08/05/15 1952     Family Communication: family updated about patient's clinical progress Disposition Plan: Anticipate discharge in 2-3 days    Brief narrative: [name] [name] is a 81 y.o. male with past medical history of CKD stage 4-5, dementia, CHF and hypertension. Patient presented with dizziness. Patient seen with his sister-in-law at bedside, mentioned that he have dizziness for the past 2 weeks comes and goes increase with ambulation, upon further questioning this dizziness is likely lightheadedness. Patient also was started to have some cough with clear sputum. Patient called his cardiologist today who asked him to come to the hospital for further evaluation. In the ED was found to have creatinine worsening to 5.5, tachycardiac with lower extremity swelling, CT scan showed no acute events, CXR showed mild loculated effusion in the left side with cardiomegaly. Patient admitted to the hospital for further evaluation.  Consultants:  None  Procedures:  None  Antibiotics: Anti-infectives    Start     Dose/Rate Route Frequency Ordered Stop   08/07/15 2200  azithromycin (ZITHROMAX) tablet 500 mg     500 mg Oral  Daily at bedtime 08/07/15 0918     08/06/15 1600  cefTRIAXone (ROCEPHIN) 1 g in dextrose 5 % 50 mL IVPB     1 g 100 mL/hr over 30 Minutes Intravenous Every 24 hours 08/05/15 1952     08/05/15 2200  azithromycin (ZITHROMAX) 500 mg in  dextrose 5 % 250 mL IVPB  Status:  Discontinued     500 mg 250 mL/hr over 60 Minutes Intravenous Every 24 hours 08/05/15 1952 08/07/15 0918   08/05/15 1545  cefTRIAXone (ROCEPHIN) 1 g in dextrose 5 % 50 mL IVPB     1 g 100 mL/hr over 30 Minutes Intravenous  Once 08/05/15 1535 08/05/15 1751         HPI/Subjective: Pulled out his iv , confused denies dizziness, sister in law by the bedside , continues to be tachy   Objective: Filed Vitals:   08/06/15 2145 08/07/15 0608 08/07/15 0901 08/07/15 1100  BP: 143/96 135/93 135/86 151/107  Pulse: 115 113 105 117  Temp: 98.2 F (36.8 C) 98.4 F (36.9 C) 98.5 F (36.9 C) 97.9 F (36.6 C)  TempSrc: Oral Oral Oral Oral  Resp: 19 18 18 14   Height:      Weight: 65.454 kg (144 lb 4.8 oz)     SpO2: 97% 97% 99% 98%    Intake/Output Summary (Last 24 hours) at 08/07/15 1306 Last data filed at 08/07/15 1304  Gross per 24 hour  Intake 1843.75 ml  Output    825 ml  Net 1018.75 ml    Exam:  General: No acute respiratory distress Lungs: Clear to auscultation bilaterally without wheezes or crackles Cardiovascular: Regular rate and rhythm without murmur gallop or rub normal S1 and S2 Abdomen: Nontender, nondistended, soft, bowel sounds positive, no rebound, no ascites, no appreciable mass Extremities: No significant cyanosis, clubbing, or edema bilateral lower extremities     Data Review   Micro Results No results found for this or any previous visit (from the past 240 hour(s)).  Radiology Reports Dg Chest 1 View  08/06/2015  CLINICAL DATA:  Dyspnea. EXAM: CHEST 1 VIEW COMPARISON:  CT of the chest 08/05/2015 FINDINGS: Postsurgical changes from prior aortic valve replacement and probable CABG are stable. Cardiomediastinal silhouette is stably enlarged. Mediastinal contours appear intact. There is no evidence of focal airspace consolidation or pneumothorax. There is persistent left pleural thickening versus effusion. Osseous structures  are without acute abnormality. Extensive osteoarthritic changes of bilateral glenohumeral joints are seen. Soft tissues are grossly normal. IMPRESSION: Stable cardiomegaly. Stable left pleural thickening versus effusion. Electronically Signed   By: Ted Mcalpine M.D.   On: 08/06/2015 17:38   Dg Chest 2 View  08/05/2015  CLINICAL DATA:  Three-week history of weakness and dizziness. Cough. EXAM: CHEST  2 VIEW COMPARISON:  Mar 01, 2013 FINDINGS: There is mild loculated effusion at the left base. There is no edema or consolidation. Heart is mildly enlarged with pulmonary vascularity within normal limits. No adenopathy. Patient is status post aortic valve replacement and coronary artery bypass grafting. No adenopathy. There is degenerative change in the thoracic spine. IMPRESSION: Mild loculated effusion left base. No edema or consolidation. Stable cardiomegaly. Electronically Signed   By: Bretta Bang III M.D.   On: 08/05/2015 15:05   Ct Head Wo Contrast  08/05/2015  CLINICAL DATA:  Intermittent dizziness for 3 weeks, confusion. Decreased appetite, weight loss. Headache. EXAM: CT HEAD WITHOUT CONTRAST TECHNIQUE: Contiguous axial images were obtained from the base of the skull through the vertex  without intravenous contrast. COMPARISON:  None. FINDINGS: Old bilateral basal ganglia lacune or infarcts. Low-density throughout the deep white matter compatible with chronic small vessel disease. No acute infarction. No hemorrhage or hydrocephalus. No mass lesion or midline shift. No acute calvarial abnormality. Visualized paranasal sinuses and mastoids clear. Orbital soft tissues unremarkable. IMPRESSION: Old bilateral basal ganglia lacunar infarcts. No acute intracranial abnormality. Atrophy, chronic microvascular disease. Electronically Signed   By: Charlett Nose M.D.   On: 08/05/2015 16:59   Ct Chest Wo Contrast  08/05/2015  CLINICAL DATA:  81 year old male with cough, chills and possible left pleural  effusion on recent chest radiograph EXAM: CT CHEST WITHOUT CONTRAST TECHNIQUE: Multidetector CT imaging of the chest was performed following the standard protocol without IV contrast. COMPARISON:  08/05/2015 and prior radiographs.  09/04/2006 CT FINDINGS: Mediastinum/Nodes: Cardiomegaly and aortic valve replacement again noted. Heavy coronary artery calcifications are identified. An ascending thoracic aortic aneurysm measuring 5 cm is unchanged. There is no evidence of pericardial effusion or enlarged lymph nodes. Lungs/Pleura: Unchanged left pleural thickening accounts for the recent radiographic abnormality. There is no evidence of pleural effusion. Mild bibasilar scarring is present. Moderate centrilobular emphysema is identified. There is no evidence of airspace disease, suspicious nodule, consolidation or mass. There is no evidence of pneumothorax. Upper abdomen: No acute abnormalities. Musculoskeletal: No acute or suspicious abnormalities. Mild degenerative disc disease in the lower thoracic spine noted. IMPRESSION: No evidence of acute abnormality. Chronic left pleural thickening accounts for abnormality identified on recent chest radiograph. No pleural effusion. Unchanged 5 cm ascending thoracic aortic aneurysm, cardiomegaly and coronary artery disease. Emphysema. Electronically Signed   By: Harmon Pier M.D.   On: 08/05/2015 20:54   Mr Brain Wo Contrast  08/06/2015  CLINICAL DATA:  81 year old hypertensive male with dementia and chronic kidney disease presenting with dizziness. Subsequent encounter. EXAM: MRI HEAD WITHOUT CONTRAST TECHNIQUE: Multiplanar, multiecho pulse sequences of the brain and surrounding structures were obtained without intravenous contrast. COMPARISON:  08/05/2015 head CT. FINDINGS: No acute infarct. Remote bilateral centrum semiovale and basal ganglia infarcts. Remote left cerebellar infarct. Small vessel disease type changes. Blood breakdown products posterior left corona radiata  and left occipital lobe probably related to prior episodes of hemorrhagic ischemia. Moderate global atrophy without hydrocephalus. No intracranial mass lesion noted on this unenhanced exam. Ectatic patent major intracranial arterial structures. Cervical medullary junction, pituitary region, pineal region and orbital structures unremarkable. IMPRESSION: No acute infarct. Remote bilateral centrum semiovale and basal ganglia infarcts. Remote left cerebellar infarct. Small vessel disease type changes. Moderate global atrophy without hydrocephalus. Electronically Signed   By: Lacy Duverney M.D.   On: 08/06/2015 21:52   Nm Pulmonary Perf And Vent  08/07/2015  CLINICAL DATA:  Shortness breath.  Dizziness for 2 weeks. EXAM: NUCLEAR MEDICINE VENTILATION - PERFUSION LUNG SCAN TECHNIQUE: Ventilation images were obtained in multiple projections using inhaled aerosol Tc-1m DTPA. Perfusion images were obtained in multiple projections after intravenous injection of Tc-50m MAA. RADIOPHARMACEUTICALS:  40 Technetium-40m DTPA aerosol inhalation and 6.0 Technetium-53m MAA IV COMPARISON:  One-view chest x-ray from the same day. FINDINGS: Ventilation: There is absent undulation in the left lower lobe. There is also segmental defect in the superior segment of the left upper lobe. The right lung uptake is normal. Perfusion: Absent per fusion is matched in the left lower lobe. A matched defect is present in the superior segment of the left upper lobe. The right lung perfuses normally. There is some clumping of radiopharmaceutical at the hilum. IMPRESSION:  1. Matched left lower lobe and left upper lobe defects. This represents a very low probability (0 - 9%) of pulmonary embolus using Pioped II criteria. Electronically Signed   By: Marin Roberts M.D.   On: 08/07/2015 10:22     CBC  Recent Labs Lab 08/05/15 1610 08/06/15 0650 08/07/15 0555  WBC 7.3 6.9 6.9  HGB 13.8 12.0* 12.5*  HCT 41.2 36.6* 37.2*  PLT 130* 123*  124*  MCV 91.8 92.9 91.2  MCH 30.7 30.5 30.6  MCHC 33.5 32.8 33.6  RDW 17.9* 17.9* 17.8*    Chemistries   Recent Labs Lab 08/05/15 1610 08/06/15 0650 08/07/15 0555  NA 140 144 136  K 4.9 4.6 4.4  CL 110 111 108  CO2 22 19* 16*  GLUCOSE 125* 95 115*  BUN 54* 61* 66*  CREATININE 5.55* 5.30* 5.64*  CALCIUM 9.0 8.8* 8.2*  AST  --   --  17  ALT  --   --  13*  ALKPHOS  --   --  40  BILITOT  --   --  0.8   ------------------------------------------------------------------------------------------------------------------ estimated creatinine clearance is 9.7 mL/min (by C-G formula based on Cr of 5.64). ------------------------------------------------------------------------------------------------------------------  Recent Labs  08/05/15 2110  HGBA1C 5.5   ------------------------------------------------------------------------------------------------------------------ No results for input(s): CHOL, HDL, LDLCALC, TRIG, CHOLHDL, LDLDIRECT in the last 72 hours. ------------------------------------------------------------------------------------------------------------------  Recent Labs  08/05/15 2110  TSH 1.610   ------------------------------------------------------------------------------------------------------------------ No results for input(s): VITAMINB12, FOLATE, FERRITIN, TIBC, IRON, RETICCTPCT in the last 72 hours.  Coagulation profile  Recent Labs Lab 08/05/15 2110  INR 1.18    No results for input(s): DDIMER in the last 72 hours.  Cardiac Enzymes  Recent Labs Lab 08/05/15 2110 08/06/15 1326 08/06/15 1829  TROPONINI 0.33* 0.18* 0.21*   ------------------------------------------------------------------------------------------------------------------ Invalid input(s): POCBNP   CBG:  Recent Labs Lab 08/05/15 1445  GLUCAP 162*       Studies: Dg Chest 1 View  08/06/2015  CLINICAL DATA:  Dyspnea. EXAM: CHEST 1 VIEW COMPARISON:  CT of the  chest 08/05/2015 FINDINGS: Postsurgical changes from prior aortic valve replacement and probable CABG are stable. Cardiomediastinal silhouette is stably enlarged. Mediastinal contours appear intact. There is no evidence of focal airspace consolidation or pneumothorax. There is persistent left pleural thickening versus effusion. Osseous structures are without acute abnormality. Extensive osteoarthritic changes of bilateral glenohumeral joints are seen. Soft tissues are grossly normal. IMPRESSION: Stable cardiomegaly. Stable left pleural thickening versus effusion. Electronically Signed   By: Ted Mcalpine M.D.   On: 08/06/2015 17:38   Dg Chest 2 View  08/05/2015  CLINICAL DATA:  Three-week history of weakness and dizziness. Cough. EXAM: CHEST  2 VIEW COMPARISON:  Mar 01, 2013 FINDINGS: There is mild loculated effusion at the left base. There is no edema or consolidation. Heart is mildly enlarged with pulmonary vascularity within normal limits. No adenopathy. Patient is status post aortic valve replacement and coronary artery bypass grafting. No adenopathy. There is degenerative change in the thoracic spine. IMPRESSION: Mild loculated effusion left base. No edema or consolidation. Stable cardiomegaly. Electronically Signed   By: Bretta Bang III M.D.   On: 08/05/2015 15:05   Ct Head Wo Contrast  08/05/2015  CLINICAL DATA:  Intermittent dizziness for 3 weeks, confusion. Decreased appetite, weight loss. Headache. EXAM: CT HEAD WITHOUT CONTRAST TECHNIQUE: Contiguous axial images were obtained from the base of the skull through the vertex without intravenous contrast. COMPARISON:  None. FINDINGS: Old bilateral basal ganglia lacune or infarcts. Low-density throughout the  deep white matter compatible with chronic small vessel disease. No acute infarction. No hemorrhage or hydrocephalus. No mass lesion or midline shift. No acute calvarial abnormality. Visualized paranasal sinuses and mastoids clear. Orbital  soft tissues unremarkable. IMPRESSION: Old bilateral basal ganglia lacunar infarcts. No acute intracranial abnormality. Atrophy, chronic microvascular disease. Electronically Signed   By: Charlett NoseKevin  Dover M.D.   On: 08/05/2015 16:59   Ct Chest Wo Contrast  08/05/2015  CLINICAL DATA:  81 year old male with cough, chills and possible left pleural effusion on recent chest radiograph EXAM: CT CHEST WITHOUT CONTRAST TECHNIQUE: Multidetector CT imaging of the chest was performed following the standard protocol without IV contrast. COMPARISON:  08/05/2015 and prior radiographs.  09/04/2006 CT FINDINGS: Mediastinum/Nodes: Cardiomegaly and aortic valve replacement again noted. Heavy coronary artery calcifications are identified. An ascending thoracic aortic aneurysm measuring 5 cm is unchanged. There is no evidence of pericardial effusion or enlarged lymph nodes. Lungs/Pleura: Unchanged left pleural thickening accounts for the recent radiographic abnormality. There is no evidence of pleural effusion. Mild bibasilar scarring is present. Moderate centrilobular emphysema is identified. There is no evidence of airspace disease, suspicious nodule, consolidation or mass. There is no evidence of pneumothorax. Upper abdomen: No acute abnormalities. Musculoskeletal: No acute or suspicious abnormalities. Mild degenerative disc disease in the lower thoracic spine noted. IMPRESSION: No evidence of acute abnormality. Chronic left pleural thickening accounts for abnormality identified on recent chest radiograph. No pleural effusion. Unchanged 5 cm ascending thoracic aortic aneurysm, cardiomegaly and coronary artery disease. Emphysema. Electronically Signed   By: Harmon PierJeffrey  Hu M.D.   On: 08/05/2015 20:54   Mr Brain Wo Contrast  08/06/2015  CLINICAL DATA:  81 year old hypertensive male with dementia and chronic kidney disease presenting with dizziness. Subsequent encounter. EXAM: MRI HEAD WITHOUT CONTRAST TECHNIQUE: Multiplanar, multiecho  pulse sequences of the brain and surrounding structures were obtained without intravenous contrast. COMPARISON:  08/05/2015 head CT. FINDINGS: No acute infarct. Remote bilateral centrum semiovale and basal ganglia infarcts. Remote left cerebellar infarct. Small vessel disease type changes. Blood breakdown products posterior left corona radiata and left occipital lobe probably related to prior episodes of hemorrhagic ischemia. Moderate global atrophy without hydrocephalus. No intracranial mass lesion noted on this unenhanced exam. Ectatic patent major intracranial arterial structures. Cervical medullary junction, pituitary region, pineal region and orbital structures unremarkable. IMPRESSION: No acute infarct. Remote bilateral centrum semiovale and basal ganglia infarcts. Remote left cerebellar infarct. Small vessel disease type changes. Moderate global atrophy without hydrocephalus. Electronically Signed   By: Lacy DuverneySteven  Olson M.D.   On: 08/06/2015 21:52   Nm Pulmonary Perf And Vent  08/07/2015  CLINICAL DATA:  Shortness breath.  Dizziness for 2 weeks. EXAM: NUCLEAR MEDICINE VENTILATION - PERFUSION LUNG SCAN TECHNIQUE: Ventilation images were obtained in multiple projections using inhaled aerosol Tc-1829m DTPA. Perfusion images were obtained in multiple projections after intravenous injection of Tc-3529m MAA. RADIOPHARMACEUTICALS:  40 Technetium-8129m DTPA aerosol inhalation and 6.0 Technetium-729m MAA IV COMPARISON:  One-view chest x-ray from the same day. FINDINGS: Ventilation: There is absent undulation in the left lower lobe. There is also segmental defect in the superior segment of the left upper lobe. The right lung uptake is normal. Perfusion: Absent per fusion is matched in the left lower lobe. A matched defect is present in the superior segment of the left upper lobe. The right lung perfuses normally. There is some clumping of radiopharmaceutical at the hilum. IMPRESSION: 1. Matched left lower lobe and left upper  lobe defects. This represents a very low probability (  0 - 9%) of pulmonary embolus using Pioped II criteria. Electronically Signed   By: Marin Roberts M.D.   On: 08/07/2015 10:22      Lab Results  Component Value Date   HGBA1C 5.5 08/05/2015   HGBA1C 5.9* 03/30/2012   Lab Results  Component Value Date   CREATININE 5.64* 08/07/2015       Scheduled Meds: . amiodarone  200 mg Oral BID  . azithromycin  500 mg Oral QHS  . calcitRIOL  0.25 mcg Oral Daily  . carvedilol  12.5 mg Oral BID WC  . cefTRIAXone (ROCEPHIN)  IV  1 g Intravenous Q24H  . digoxin  0.25 mg Intravenous Once  . donepezil  5 mg Oral QHS  . feeding supplement (NEPRO CARB STEADY)  237 mL Oral Q24H  . furosemide  60 mg Intravenous Q12H  . heparin  3,000 Units Intravenous Once  . isosorbide-hydrALAZINE  1 tablet Oral TID  . sodium chloride  3 mL Intravenous Q12H  . warfarin  2.5 mg Oral ONCE-1800  . Warfarin - Pharmacist Dosing Inpatient   Does not apply q1800   Continuous Infusions: . heparin      Principal Problem:   Dizziness Active Problems:   Chronic kidney disease (CKD), stage IV (severe) (HCC)   Pneumonia   Diabetes mellitus type 2, diet-controlled (HCC)   CAD (coronary artery disease)   Status post aortic valve replacement   Protein-calorie malnutrition, severe    Time spent: 45 minutes   [facility]  [facility] Pager [phone]. If 7PM-7AM, please contact night-coverage at [email or web address], password [facility] 08/07/2015, 1:06 PM  LOS: 2 days

## 2015-08-07 NOTE — Progress Notes (Signed)
ANTICOAGULATION CONSULT NOTE - Initial Consult  Pharmacy Consult for heparin and heparin Indication: atrial fibrillation  Allergies  Allergen Reactions  . Clams [Shellfish Allergy]     Gout     Patient Measurements: Height: 5\' 10"  (177.8 cm) Weight: 144 lb 4.8 oz (65.454 kg) IBW/kg (Calculated) : 73   Vital Signs: Temp: 98.5 F (36.9 C) (10/14 0901) Temp Source: Oral (10/14 0901) BP: 135/86 mmHg (10/14 0901) Pulse Rate: 105 (10/14 0901)  Labs:  Recent Labs  08/05/15 1610 08/05/15 2110 08/06/15 0650 08/06/15 1326 08/06/15 1829 08/07/15 0555  HGB 13.8  --  12.0*  --   --  12.5*  HCT 41.2  --  36.6*  --   --  37.2*  PLT 130*  --  123*  --   --  124*  LABPROT  --  15.2  --   --   --   --   INR  --  1.18  --   --   --   --   CREATININE 5.55*  --  5.30*  --   --  5.64*  TROPONINI  --  0.33*  --  0.18* 0.21*  --     Estimated Creatinine Clearance: 9.7 mL/min (by C-G formula based on Cr of 5.64).   Medical History: Past Medical History  Diagnosis Date  . Hypertension   . Renal disorder     renal failure  . CHF (congestive heart failure) (HCC)   . Dementia     Medications:  Prescriptions prior to admission  Medication Sig Dispense Refill Last Dose  . amiodarone (PACERONE) 200 MG tablet Take 1 tablet (200 mg total) by mouth daily. 30 tablet 3 08/04/2015 at Unknown time  . calcitRIOL (ROCALTROL) 0.25 MCG capsule Take 0.25 mcg by mouth daily.    08/05/2015 at Unknown time  . carvedilol (COREG) 3.125 MG tablet Take 3.125 mg by mouth 2 (two) times daily with a meal.   08/05/2015 at 800  . donepezil (ARICEPT) 5 MG tablet Take 5 mg by mouth at bedtime.   over 30 days  . furosemide (LASIX) 40 MG tablet Take 2 tablets (80 mg total) by mouth 2 (two) times daily. (Patient taking differently: Take 40 mg by mouth 2 (two) times daily. ) 60 tablet 3 08/05/2015 at Unknown time  . isosorbide-hydrALAZINE (BIDIL) 20-37.5 MG per tablet Take 1 tablet by mouth 3 (three) times daily.    08/05/2015 at Unknown time  . carvedilol (COREG) 3.125 MG tablet Take 1 tablet (3.125 mg total) by mouth every 12 (twelve) hours. 60 tablet 3 03/01/2013 at 7pm  . memantine (NAMENDA) 5 MG tablet Take 1 tablet (5 mg total) by mouth daily. (Patient not taking: Reported on 08/05/2015) 30 tablet 3 Not Taking at Unknown time    Assessment: 81 yo M with dementia and ARF.  Pharmacy consulted to dose heparin and coumadin for new onset afib.   Wt 65.5 kg, Hg 13.8>12.5. PLCT low at 124 - appears to have chronic thrombocytopenia.  Discussed thrombocytopenia w/ Dr. Sharyn LullHarwani.  Pt with high risk of having another stroke.  Per Renal MD family does not wish to start HD for his ARF.  His baseline INR is 1.18.   He is on amiodarone and azithromycin which may increase INR.     Goal of Therapy:  INR 2-3 Heparin level 0.3-0.7 units/ml Monitor platelets by anticoagulation protocol: Yes   Plan:  DC sq heparin for VTE px Give 3000 units bolus x 1 Start heparin  infusion at 900 units/hr Check anti-Xa level in 8 hours and daily while on heparin Continue to monitor H&H and platelets Coumadin 2.5 mg po x 1 dose Daily INR F/u plan of care - will not be able to educate pt due to dementia, will need to educate family about coumadin  Herby Abraham, Pharm.D. [phone] 08/07/2015 10:28 AM

## 2015-08-07 NOTE — Progress Notes (Signed)
MD Abrol was called about patient's b/p reading 97/53 and pulse 117- verbal orders to given digoxin at this time. Verbal orders to cancel bidil for this afternoon and to give coreg after 500cc bolus. Coreg to be changed to 3.125 twice a day. Will continue to monitor

## 2015-08-07 NOTE — Evaluation (Signed)
Physical Therapy Evaluation Patient Details Name: [name] [name] MRN: [id number] DOB: [birth year] Today'[name] Date: 08/07/2015   History of Present Illness  81 yo male admitted from home alone with dizziness. pt in ED found to have creatinine worsening to 5.5 and tachycardia.  PMH: dementia, CKD IV-V, CHF , HTN  Clinical Impression  Pt admitted with the above diagnosis. Pt currently with functional limitations due to the deficits listed below (see PT Problem List). Moderately unstable with short distance gait assessed today. HR up to 119. Poor historian, slightly more oriented than this AM session with occupational therapist. Pt will benefit from skilled PT to increase their independence and safety with mobility to allow discharge to the venue listed below.       Follow Up Recommendations SNF    Equipment Recommendations  None recommended by PT    Recommendations for Other Services       Precautions / Restrictions Precautions Precautions: Fall Restrictions Weight Bearing Restrictions: No      Mobility  Bed Mobility Overal bed mobility: Modified Independent             General bed mobility comments: extra time  Transfers Overall transfer level: Needs assistance Equipment used: 1 person hand held assist (IV pole) Transfers: Sit to/from Stand;Stand Pivot Transfers Sit to Stand: Min assist Stand pivot transfers: Min assist       General transfer comment: Min assist for balance to stand and then pivot to [facility]. VC for technique throughout. Slightly unsteady. Rising from bed a second time min guard with pt reaching for IV pole for support.  Ambulation/Gait Ambulation/Gait assistance: Min assist Ambulation Distance (Feet): 15 Feet Assistive device:  (Held IV pole) Gait Pattern/deviations: Step-through pattern;Decreased stride length;Shuffle;Trunk flexed Gait velocity: slow Gait velocity interpretation: <1.8 ft/sec, indicative of risk for recurrent falls General Gait  Details: VC for upright posture, took a couple of steps without assistive device however reaches for IV pole. Did have one episode of loss of balance towards the right requiring min assist to correct. VC for awareness and safety. HR 119.  Stairs            Wheelchair Mobility    Modified Rankin (Stroke Patients Only)       Balance                                             Pertinent Vitals/Pain Pain Assessment: No/denies pain    Home Living Family/patient expects to be discharged to:: Private residence Living Arrangements: Alone Available Help at Discharge: Available PRN/intermittently;Family Type of Home: House Home Access: Stairs to enter Entrance Stairs-Rails: None Entrance Stairs-Number of Steps: 4 Home Layout: One level Home Equipment: Cane - single point;Walker - 2 wheels;Bedside commode      Prior Function Level of Independence: Independent         Comments: pt drives and attends church weekly. Pt [name] sister in law POA arriving at the end of evaluation. She reports "he has been hallunicating. He has accidents all the time and can't make it to the bathroom in time. He has a pill box I have been fixing and he doesnt take the medication." POA reports that patient stated come over here and look I already did my pills and she reports they were 100% correct in the box but he didnt take them like the box is suppose to be scheduled.  patients brother in law also assisting with care to help with decr burden of care. Family reports this decline has been rapid over the last 3 weeks     Hand Dominance   Dominant Hand: Right    Extremity/Trunk Assessment   Upper Extremity Assessment: Defer to OT evaluation                  Cervical / Trunk Assessment: Normal  Communication   Communication: No difficulties  Cognition Arousal/Alertness: Awake/alert Behavior During Therapy: WFL for tasks assessed/performed Overall Cognitive Status:  Impaired/Different from baseline Area of Impairment: Orientation;Attention;Memory;Following commands;Safety/judgement;Awareness;Problem solving Orientation Level: Disoriented to;Place;Time;Situation Current Attention Level: Sustained Memory: Decreased recall of precautions;Decreased short-term memory Following Commands: Follows one step commands with increased time Safety/Judgement: Decreased awareness of safety;Decreased awareness of deficits Awareness: Intellectual Problem Solving: Slow processing;Decreased initiation General Comments: correct year, unsure of month, thought he was in rest home or hospital but unsure.    General Comments General comments (skin integrity, edema, etc.): Spoke with OT, reports she received verbal confirmation from MD that OT/PT is to perform evaluation as elevated troponin is likely renal related. Limited comprehensive evaluation (further distance gait) until further confirmation from cardiologist.    Exercises        Assessment/Plan    PT Assessment Patient needs continued PT services  PT Diagnosis Difficulty walking;Abnormality of gait;Generalized weakness;Altered mental status   PT Problem List Decreased strength;Decreased activity tolerance;Decreased balance;Decreased mobility;Decreased cognition;Decreased knowledge of use of DME;Decreased safety awareness;Decreased knowledge of precautions;Cardiopulmonary status limiting activity  PT Treatment Interventions DME instruction;Gait training;Functional mobility training;Therapeutic activities;Therapeutic exercise;Balance training;Neuromuscular re-education;Cognitive remediation;Patient/family education   PT Goals (Current goals can be found in the Care Plan section) Acute Rehab PT Goals Patient Stated Goal: none stated PT Goal Formulation: With patient Time For Goal Achievement: 08/21/15 Potential to Achieve Goals: Good    Frequency Min 3X/week   Barriers to discharge Decreased caregiver  support lives alone    Co-evaluation               End of Session Equipment Utilized During Treatment: Gait belt Activity Tolerance: Other (comment) (Waiting for cardiac etiology r/u before increased ambulation) Patient left: in chair;with call bell/phone within reach;with chair alarm set;with nursing/sitter in room Nurse Communication: Mobility status         Time: 1741-1759 PT Time Calculation (min) (ACUTE ONLY): 18 min   Charges:   PT Evaluation $Initial PT Evaluation Tier I: 1 Procedure     PT G CodesBerton [name]:        [name] [name] 08/07/2015, 6:16 PM [name] SpillersLogan [name] [name] [place], [name] CarolinaPT [phone]

## 2015-08-07 NOTE — Progress Notes (Signed)
Utilization review completed. Skyler Carel, RN, BSN. 

## 2015-08-07 NOTE — Clinical Social Work Placement (Signed)
   CLINICAL SOCIAL WORK PLACEMENT  NOTE  Date:  08/07/2015  Patient Details  Name: [name] [name] MRN: [id number] Date of Birth: 09/21/1933  Clinical Social Work is seeking post-discharge placement for this patient at the Skilled  Nursing Facility level of care (*CSW will initial, date and re-position this form in  chart as items are completed):  Yes   Patient/family provided with [facility] Clinical Social Work Department's list of facilities offering this level of care within the geographic area requested by the patient (or if unable, by the patient's family).  Yes   Patient/family informed of their freedom to choose among providers that offer the needed level of care, that participate in Medicare, Medicaid or managed care program needed by the patient, have an available bed and are willing to accept the patient.  No   Patient/family informed of [facility] interest [facility] and [facility], as well as of the fact that they are under no obligation to receive care at these facilities.  PASRR submitted to EDS on 08/07/15     PASRR number received on       Existing PASRR number confirmed on       FL2 transmitted to all facilities in geographic area requested by pt/family on 08/07/15     FL2 transmitted to all facilities within larger geographic area on       Patient informed that his/her managed care company has contracts with or will negotiate with certain facilities, including the following:            Patient/family informed of bed offers received.  Patient chooses bed at       Physician recommends and patient chooses bed at      Patient to be transferred to   on  .  Patient to be transferred to facility by       Patient family notified on   of transfer.  Name of family member notified:        PHYSICIAN Please prepare priority discharge summary, including medications, Please prepare prescriptions     Additional Comment:     _______________________________________________ Cristobal Goldmannrawford, Aarilyn Dye Bradley, LCSW 08/07/2015, 7:17 PM

## 2015-08-07 NOTE — Progress Notes (Signed)
ANTICOAGULATION CONSULT NOTE - Follow Up Consult  Pharmacy Consult for Heparin  Indication: atrial fibrillation  Allergies  Allergen Reactions  . Clams [Shellfish Allergy]     Gout     Patient Measurements: Height: 5\' 10"  (177.8 cm) Weight: 147 lb 11.2 oz (66.996 kg) IBW/kg (Calculated) : 73  Vital Signs: Temp: 97.7 F (36.5 C) (10/14 2040) Temp Source: Oral (10/14 2040) BP: 126/93 mmHg (10/14 2040) Pulse Rate: 110 (10/14 2040)  Labs:  Recent Labs  08/05/15 1610 08/05/15 2110 08/06/15 0650 08/06/15 1326 08/06/15 1829 08/07/15 0555 08/07/15 2246  HGB 13.8  --  12.0*  --   --  12.5*  --   HCT 41.2  --  36.6*  --   --  37.2*  --   PLT 130*  --  123*  --   --  124*  --   LABPROT  --  15.2  --   --   --   --   --   INR  --  1.18  --   --   --   --   --   HEPARINUNFRC  --   --   --   --   --   --  0.90*  CREATININE 5.55*  --  5.30*  --   --  5.64*  --   TROPONINI  --  0.33*  --  0.18* 0.21*  --   --     Estimated Creatinine Clearance: 9.9 mL/min (by C-G formula based on Cr of 5.64).   Assessment: First HL is elevated at 0.9, no issues per RN.   Goal of Therapy:  Heparin level 0.3-0.7 units/ml Monitor platelets by anticoagulation protocol: Yes   Plan:  -Decrease heparin to 750 units/hr -HL with AM labs  Abran DukeLedford, Rosemary Pentecost 08/07/2015,11:23 PM

## 2015-08-07 NOTE — Consult Note (Signed)
Reason for Consult:atrial fib flutter/minimally elevated troponin I Referring Physician: [facility] hospitalist  [name] [name] is an 81 y.o. male.  HPI: patient is 81 year old male with past medical history significant for multiple medical problems, i.e., coronary artery disease, history of inferior wall MI in the past, status post CABG, history of CVA or aortic stenosis status post bioprosthetic AVR, history of nonsustained VT and SVT in the past, hypertension, hyperlipidemia, chronic kidney disease, dementia, Was admitted on 08/05/2015 because of dizziness.  Failure to thrive and poor appetite associated with coughing .  Patient denies any chest pain.  Cardiology consultation is called.  As patient was noted to have atrial flutter with 2 to one block, and minimally elevated troponin.patient also was noted to have worsening renal function and has opted for medical management only in the past and has refused for hemodialysis.  Patient also has stated in the past regarding DNR. Past Medical History  Diagnosis Date  . Hypertension   . Renal disorder     renal failure  . CHF (congestive heart failure) ([place])   . Dementia     Past Surgical History  Procedure Laterality Date  . Cardiac surgery    . Cardiac valve surgery    . Femoral artery stent      right leg per pt    History reviewed. No pertinent family history.  Social History:  reports that he quit smoking about 32 years ago. His smoking use included Cigarettes. He has a 3.75 pack-year smoking history. He has never used smokeless tobacco. He reports that he does not drink alcohol or use illicit drugs.  Allergies:  Allergies  Allergen Reactions  . Clams [Shellfish Allergy]     Gout     Medications: I have reviewed the patient's current medications.  Results for orders placed or performed during the hospital encounter of 08/05/15 (from the past 48 hour(s))  CBG monitoring, ED     Status: Abnormal   Collection Time: 08/05/15  2:45  PM  Result Value Ref Range   Glucose-Capillary 162 (H) 65 - 99 mg/dL  Basic metabolic panel     Status: Abnormal   Collection Time: 08/05/15  4:10 PM  Result Value Ref Range   Sodium 140 135 - 145 mmol/L   Potassium 4.9 3.5 - 5.1 mmol/L   Chloride 110 101 - 111 mmol/L   CO2 22 22 - 32 mmol/L   Glucose, Bld 125 (H) 65 - 99 mg/dL   BUN 54 (H) 6 - 20 mg/dL   Creatinine, Ser 5.55 (H) 0.61 - 1.24 mg/dL   Calcium 9.0 8.9 - 10.3 mg/dL   GFR calc non Af Amer 9 (L) >60 mL/min   GFR calc Af Amer 10 (L) >60 mL/min    Comment: (NOTE) The eGFR has been calculated using the CKD EPI equation. This calculation has not been validated in all clinical situations. eGFR's persistently <60 mL/min signify possible Chronic Kidney Disease.    Anion gap 8 5 - 15  CBC     Status: Abnormal   Collection Time: 08/05/15  4:10 PM  Result Value Ref Range   WBC 7.3 4.0 - 10.5 K/uL   RBC 4.49 4.22 - 5.81 MIL/uL   Hemoglobin 13.8 13.0 - 17.0 g/dL   HCT 41.2 39.0 - 52.0 %   MCV 91.8 78.0 - 100.0 fL   MCH 30.7 26.0 - 34.0 pg   MCHC 33.5 30.0 - 36.0 g/dL   RDW 17.9 (H) 11.5 - 15.5 %  Platelets 130 (L) 150 - 400 K/uL  Urinalysis, Routine w reflex microscopic (not at [facility])     Status: Abnormal   Collection Time: 08/05/15  4:35 PM  Result Value Ref Range   Color, Urine YELLOW YELLOW   APPearance CLEAR CLEAR   Specific Gravity, Urine 1.008 1.005 - 1.030   pH 5.0 5.0 - 8.0   Glucose, UA NEGATIVE NEGATIVE mg/dL   Hgb urine dipstick NEGATIVE NEGATIVE   Bilirubin Urine NEGATIVE NEGATIVE   Ketones, ur NEGATIVE NEGATIVE mg/dL   Protein, ur 100 (A) NEGATIVE mg/dL   Urobilinogen, UA 0.2 0.0 - 1.0 mg/dL   Nitrite NEGATIVE NEGATIVE   Leukocytes, UA NEGATIVE NEGATIVE  Urine microscopic-add on     Status: Abnormal   Collection Time: 08/05/15  4:35 PM  Result Value Ref Range   Squamous Epithelial / LPF RARE RARE   WBC, UA 0-2 <3 WBC/hpf   RBC / HPF 0-2 <3 RBC/hpf   Bacteria, UA RARE RARE   Casts GRANULAR CAST (A)  NEGATIVE  Troponin I     Status: Abnormal   Collection Time: 08/05/15  9:10 PM  Result Value Ref Range   Troponin I 0.33 (H) <0.031 ng/mL    Comment:        PERSISTENTLY INCREASED TROPONIN VALUES IN THE RANGE OF 0.04-0.49 ng/mL CAN BE SEEN IN:       -UNSTABLE ANGINA       -CONGESTIVE HEART FAILURE       -MYOCARDITIS       -CHEST TRAUMA       -ARRYHTHMIAS       -LATE PRESENTING MYOCARDIAL INFARCTION       -COPD   CLINICAL FOLLOW-UP RECOMMENDED.   Brain natriuretic peptide     Status: Abnormal   Collection Time: 08/05/15  9:10 PM  Result Value Ref Range   B Natriuretic Peptide 1277.2 (H) 0.0 - 100.0 pg/mL  Protime-INR     Status: None   Collection Time: 08/05/15  9:10 PM  Result Value Ref Range   Prothrombin Time 15.2 11.6 - 15.2 seconds   INR 1.18 0.00 - 1.49  TSH     Status: None   Collection Time: 08/05/15  9:10 PM  Result Value Ref Range   TSH 1.610 0.350 - 4.500 uIU/mL  Hemoglobin A1c     Status: None   Collection Time: 08/05/15  9:10 PM  Result Value Ref Range   Hgb A1c MFr Bld 5.5 4.8 - 5.6 %    Comment: (NOTE)         Pre-diabetes: 5.7 - 6.4         Diabetes: >6.4         Glycemic control for adults with diabetes: <7.0    Mean Plasma Glucose 111 mg/dL    Comment: (NOTE) Performed At: [facility] [place], [place] [id number] Lindon Romp MD JQ:[id number]   Basic metabolic panel     Status: Abnormal   Collection Time: 08/06/15  6:50 AM  Result Value Ref Range   Sodium 144 135 - 145 mmol/L   Potassium 4.6 3.5 - 5.1 mmol/L   Chloride 111 101 - 111 mmol/L   CO2 19 (L) 22 - 32 mmol/L   Glucose, Bld 95 65 - 99 mg/dL   BUN 61 (H) 6 - 20 mg/dL   Creatinine, Ser 5.30 (H) 0.61 - 1.24 mg/dL   Calcium 8.8 (L) 8.9 - 10.3 mg/dL   GFR calc non Af Amer 9 (  L) >60 mL/min   GFR calc Af Amer 11 (L) >60 mL/min    Comment: (NOTE) The eGFR has been calculated using the CKD EPI equation. This calculation has not been validated in all clinical  situations. eGFR's persistently <60 mL/min signify possible Chronic Kidney Disease.    Anion gap 14 5 - 15  CBC     Status: Abnormal   Collection Time: 08/06/15  6:50 AM  Result Value Ref Range   WBC 6.9 4.0 - 10.5 K/uL    Comment: REPEATED TO VERIFY   RBC 3.94 (L) 4.22 - 5.81 MIL/uL   Hemoglobin 12.0 (L) 13.0 - 17.0 g/dL    Comment: REPEATED TO VERIFY   HCT 36.6 (L) 39.0 - 52.0 %   MCV 92.9 78.0 - 100.0 fL   MCH 30.5 26.0 - 34.0 pg   MCHC 32.8 30.0 - 36.0 g/dL   RDW 17.9 (H) 11.5 - 15.5 %   Platelets 123 (L) 150 - 400 K/uL    Comment: REPEATED TO VERIFY  Troponin I     Status: Abnormal   Collection Time: 08/06/15  1:26 PM  Result Value Ref Range   Troponin I 0.18 (H) <0.031 ng/mL    Comment:        PERSISTENTLY INCREASED TROPONIN VALUES IN THE RANGE OF 0.04-0.49 ng/mL CAN BE SEEN IN:       -UNSTABLE ANGINA       -CONGESTIVE HEART FAILURE       -MYOCARDITIS       -CHEST TRAUMA       -ARRYHTHMIAS       -LATE PRESENTING MYOCARDIAL INFARCTION       -COPD   CLINICAL FOLLOW-UP RECOMMENDED.   Troponin I     Status: Abnormal   Collection Time: 08/06/15  6:29 PM  Result Value Ref Range   Troponin I 0.21 (H) <0.031 ng/mL    Comment:        PERSISTENTLY INCREASED TROPONIN VALUES IN THE RANGE OF 0.04-0.49 ng/mL CAN BE SEEN IN:       -UNSTABLE ANGINA       -CONGESTIVE HEART FAILURE       -MYOCARDITIS       -CHEST TRAUMA       -ARRYHTHMIAS       -LATE PRESENTING MYOCARDIAL INFARCTION       -COPD   CLINICAL FOLLOW-UP RECOMMENDED.   CBC     Status: Abnormal   Collection Time: 08/07/15  5:55 AM  Result Value Ref Range   WBC 6.9 4.0 - 10.5 K/uL   RBC 4.08 (L) 4.22 - 5.81 MIL/uL   Hemoglobin 12.5 (L) 13.0 - 17.0 g/dL   HCT 37.2 (L) 39.0 - 52.0 %   MCV 91.2 78.0 - 100.0 fL   MCH 30.6 26.0 - 34.0 pg   MCHC 33.6 30.0 - 36.0 g/dL   RDW 17.8 (H) 11.5 - 15.5 %   Platelets 124 (L) 150 - 400 K/uL    Comment: SPECIMEN CHECKED FOR CLOTS REPEATED TO VERIFY CONSISTENT WITH  PREVIOUS RESULT   Comprehensive metabolic panel     Status: Abnormal   Collection Time: 08/07/15  5:55 AM  Result Value Ref Range   Sodium 136 135 - 145 mmol/L   Potassium 4.4 3.5 - 5.1 mmol/L   Chloride 108 101 - 111 mmol/L   CO2 16 (L) 22 - 32 mmol/L   Glucose, Bld 115 (H) 65 - 99 mg/dL   BUN 66 (H) 6 - 20 mg/dL  Creatinine, Ser 5.64 (H) 0.61 - 1.24 mg/dL   Calcium 8.2 (L) 8.9 - 10.3 mg/dL   Total Protein 6.3 (L) 6.5 - 8.1 g/dL   Albumin 3.3 (L) 3.5 - 5.0 g/dL   AST 17 15 - 41 U/L   ALT 13 (L) 17 - 63 U/L   Alkaline Phosphatase 40 38 - 126 U/L   Total Bilirubin 0.8 0.3 - 1.2 mg/dL   GFR calc non Af Amer 9 (L) >60 mL/min   GFR calc Af Amer 10 (L) >60 mL/min    Comment: (NOTE) The eGFR has been calculated using the CKD EPI equation. This calculation has not been validated in all clinical situations. eGFR's persistently <60 mL/min signify possible Chronic Kidney Disease.    Anion gap 12 5 - 15    Dg Chest 1 View  08/06/2015  CLINICAL DATA:  Dyspnea. EXAM: CHEST 1 VIEW COMPARISON:  CT of the chest 08/05/2015 FINDINGS: Postsurgical changes from prior aortic valve replacement and probable CABG are stable. Cardiomediastinal silhouette is stably enlarged. Mediastinal contours appear intact. There is no evidence of focal airspace consolidation or pneumothorax. There is persistent left pleural thickening versus effusion. Osseous structures are without acute abnormality. Extensive osteoarthritic changes of bilateral glenohumeral joints are seen. Soft tissues are grossly normal. IMPRESSION: Stable cardiomegaly. Stable left pleural thickening versus effusion. Electronically Signed   By: Fidela Salisbury M.D.   On: 08/06/2015 17:38   Dg Chest 2 View  08/05/2015  CLINICAL DATA:  Three-week history of weakness and dizziness. Cough. EXAM: CHEST  2 VIEW COMPARISON:  Mar 01, 2013 FINDINGS: There is mild loculated effusion at the left base. There is no edema or consolidation. Heart is mildly  enlarged with pulmonary vascularity within normal limits. No adenopathy. Patient is status post aortic valve replacement and coronary artery bypass grafting. No adenopathy. There is degenerative change in the thoracic spine. IMPRESSION: Mild loculated effusion left base. No edema or consolidation. Stable cardiomegaly. Electronically Signed   By: Lowella Grip III M.D.   On: 08/05/2015 15:05   Ct Head Wo Contrast  08/05/2015  CLINICAL DATA:  Intermittent dizziness for 3 weeks, confusion. Decreased appetite, weight loss. Headache. EXAM: CT HEAD WITHOUT CONTRAST TECHNIQUE: Contiguous axial images were obtained from the base of the skull through the vertex without intravenous contrast. COMPARISON:  None. FINDINGS: Old bilateral basal ganglia lacune or infarcts. Low-density throughout the deep white matter compatible with chronic small vessel disease. No acute infarction. No hemorrhage or hydrocephalus. No mass lesion or midline shift. No acute calvarial abnormality. Visualized paranasal sinuses and mastoids clear. Orbital soft tissues unremarkable. IMPRESSION: Old bilateral basal ganglia lacunar infarcts. No acute intracranial abnormality. Atrophy, chronic microvascular disease. Electronically Signed   By: Rolm Baptise M.D.   On: 08/05/2015 16:59   Ct Chest Wo Contrast  08/05/2015  CLINICAL DATA:  81 year old male with cough, chills and possible left pleural effusion on recent chest radiograph EXAM: CT CHEST WITHOUT CONTRAST TECHNIQUE: Multidetector CT imaging of the chest was performed following the standard protocol without IV contrast. COMPARISON:  08/05/2015 and prior radiographs.  09/04/2006 CT FINDINGS: Mediastinum/Nodes: Cardiomegaly and aortic valve replacement again noted. Heavy coronary artery calcifications are identified. An ascending thoracic aortic aneurysm measuring 5 cm is unchanged. There is no evidence of pericardial effusion or enlarged lymph nodes. Lungs/Pleura: Unchanged left pleural  thickening accounts for the recent radiographic abnormality. There is no evidence of pleural effusion. Mild bibasilar scarring is present. Moderate centrilobular emphysema is identified. There is no evidence  of airspace disease, suspicious nodule, consolidation or mass. There is no evidence of pneumothorax. Upper abdomen: No acute abnormalities. Musculoskeletal: No acute or suspicious abnormalities. Mild degenerative disc disease in the lower thoracic spine noted. IMPRESSION: No evidence of acute abnormality. Chronic left pleural thickening accounts for abnormality identified on recent chest radiograph. No pleural effusion. Unchanged 5 cm ascending thoracic aortic aneurysm, cardiomegaly and coronary artery disease. Emphysema. Electronically Signed   By: Margarette Canada M.D.   On: 08/05/2015 20:54   Mr Brain Wo Contrast  08/06/2015  CLINICAL DATA:  81 year old hypertensive male with dementia and chronic kidney disease presenting with dizziness. Subsequent encounter. EXAM: MRI HEAD WITHOUT CONTRAST TECHNIQUE: Multiplanar, multiecho pulse sequences of the brain and surrounding structures were obtained without intravenous contrast. COMPARISON:  08/05/2015 head CT. FINDINGS: No acute infarct. Remote bilateral centrum semiovale and basal ganglia infarcts. Remote left cerebellar infarct. Small vessel disease type changes. Blood breakdown products posterior left corona radiata and left occipital lobe probably related to prior episodes of hemorrhagic ischemia. Moderate global atrophy without hydrocephalus. No intracranial mass lesion noted on this unenhanced exam. Ectatic patent major intracranial arterial structures. Cervical medullary junction, pituitary region, pineal region and orbital structures unremarkable. IMPRESSION: No acute infarct. Remote bilateral centrum semiovale and basal ganglia infarcts. Remote left cerebellar infarct. Small vessel disease type changes. Moderate global atrophy without hydrocephalus.  Electronically Signed   By: Genia Del M.D.   On: 08/06/2015 21:52   Nm Pulmonary Perf And Vent  08/07/2015  CLINICAL DATA:  Shortness breath.  Dizziness for 2 weeks. EXAM: NUCLEAR MEDICINE VENTILATION - PERFUSION LUNG SCAN TECHNIQUE: Ventilation images were obtained in multiple projections using inhaled aerosol Tc-5mDTPA. Perfusion images were obtained in multiple projections after intravenous injection of Tc-956mAA. RADIOPHARMACEUTICALS:  40 Technetium-9939mPA aerosol inhalation and 6.0 Technetium-69m85m IV COMPARISON:  One-view chest x-ray from the same day. FINDINGS: Ventilation: There is absent undulation in the left lower lobe. There is also segmental defect in the superior segment of the left upper lobe. The right lung uptake is normal. Perfusion: Absent per fusion is matched in the left lower lobe. A matched defect is present in the superior segment of the left upper lobe. The right lung perfuses normally. There is some clumping of radiopharmaceutical at the hilum. IMPRESSION: 1. Matched left lower lobe and left upper lobe defects. This represents a very low probability (0 - 9%) of pulmonary embolus using Pioped II criteria. Electronically Signed   By: ChriSan Morelle.   On: 08/07/2015 10:22    Review of Systems  Constitutional: Positive for weight loss and malaise/fatigue.  Respiratory: Positive for cough. Negative for hemoptysis and sputum production.   Cardiovascular: Negative for chest pain.  Gastrointestinal: Positive for nausea.  Genitourinary: Negative for dysuria.  Skin: Negative for rash.  Neurological: Positive for dizziness and weakness. Negative for headaches.   Blood pressure 151/107, pulse 117, temperature 97.9 F (36.6 C), temperature source Oral, resp. rate 14, height _0  (1.778 m), weight 65.454 kg (144 lb 4.8 oz), SpO2 98 %. Physical Exam  Eyes: Conjunctivae are normal. Left eye exhibits no discharge. No scleral icterus.  Neck: Normal range of motion.  Neck supple. No thyromegaly present.  Cardiovascular:  Tachycardic, S1, S2 soft.  There is soft systolic murmur noted  Respiratory:  Decreased breath sounds at bases  GI: Soft. Bowel sounds are normal. He exhibits distension.  Musculoskeletal: He exhibits no edema or tenderness.  Neurological:  Drowsy, sleepy but arousable  Assessment/Plan: New-onset atrial flutter with [email or web address] score of 8 Coronary artery disease status post CABG in the past. History of severe aortic stenosis, status post bioprosthetic aortic valve replacement. Minimally elevated troponin I secondary to progressive renal failure/tachycardia without significant MI Hypertension. Diabetes mellitus History of nonsustained ventricular tachycardia and SVT in the past. Chronic progressive kidney disease stage IV. Hyperlipidemia. Dementia. Chronic thrombocytopenia History of old CVA Plan Start heparin/Coumadin per pharmacy protocol Increase amiodarone to 200 mg by mouth twice a day Digoxin 0.25 mg IV 1 Agree with increasing carvedilol. As blood pressure tolerates. Discussed with sister in law and agrees for medical management .  Charolette Forward 08/07/2015, 11:55 AM

## 2015-08-07 NOTE — Clinical Social Work Note (Signed)
Clinical Social Work Assessment  Patient Details  Name: [name] GammaClarence E Berwick MRN: [id number] Date of Birth: 10/27/1932  Date of referral:  08/07/15               Reason for consult:  Facility Placement                Permission sought to share information with:  Family Supports (CSW talked with patient's sister-in-law Marta AntuFaye Thorne as patient oriented to person, place and time.) Permission granted to share information::  No (Patient was asleep when CSW visited room. Ms. Jacinto Reaphorne is Durable and HCPOA.)  Name::     Marta AntuFaye Thorne  Agency::     Relationship::  Sister-in-law  Contact Information:  [phone].  Housing/Transportation Living arrangements for the past 2 months:  Single Family Home Source of Information:  Power of ExlineAttorney, Other (Comment Required) (Sister-in-law) Patient Interpreter Needed:  None Criminal Activity/Legal Involvement Pertinent to Current Situation/Hospitalization:  No - Comment as needed Significant Relationships:    Lives with:  Other (Comment), Siblings Do you feel safe going back to the place where you live?  No (Patient unable to return home, where he lives alone) Need for family participation in patient care:  Yes (Comment)  Care giving concerns:  Ms. Jacinto Reaphorne indicated that patient would need rehab before going home as he lives alone.   Social Worker assessment / plan:  CSW talked by phone with Ms. Throne, sister-in-law and durable/HCPOA for patient. She is in agreement with ST rehab and was advised that SNF list was left with a family member (brother-in-law) in patient's room. Ms. Roxan HockeyRobinson reported that patient told her that if he had to go to a nursing home [facility] Ms. Jacinto Reaphorne informed CSW that [name] has one brother Morrie Sheldonoye Seawright, who lives in [place] and a niece Elita Quickam, who has brain cancer.    Employment status:  Retired Database administratornsurance information:  Managed Medicare PT Recommendations:  Skilled Nursing Facility Information / Referral to community  resources:  Skilled Nursing Facility  Patient/Family's Response to care:  No concerns expressed.  Patient/Family's Understanding of and Emotional Response to Diagnosis, Current Treatment, and Prognosis:  Ms. Jacinto Reaphorne reported that patient has chronic kidney disease and has already indicated that he does not want dialysis, if it ever gets to that point.  Emotional Assessment Appearance:  Appears stated age (Patient was asleep when CSW visited room) Attitude/Demeanor/Rapport:  Unable to Assess Affect (typically observed):  Unable to Assess Orientation:  Oriented to Self, Oriented to Place, Oriented to  Time Alcohol / Substance use:  Tobacco Use (Patient quit smoking about 32 years ago. He reports that he does not drink or use illicit drugs.) Psych involvement (Current and /or in the community):  No (Comment)  Discharge Needs  Concerns to be addressed:  Discharge Planning Concerns Readmission within the last 30 days:  No Current discharge risk:  None Barriers to Discharge:  No Barriers Identified   Cristobal GoldmannCrawford, Christol Thetford Bradley, LCSW 08/07/2015, 7:12 PM

## 2015-08-08 LAB — CBC
HCT: 39.9 % (ref 39.0–52.0)
Hemoglobin: 13.1 g/dL (ref 13.0–17.0)
MCH: 30.5 pg (ref 26.0–34.0)
MCHC: 32.8 g/dL (ref 30.0–36.0)
MCV: 93 fL (ref 78.0–100.0)
PLATELETS: 172 10*3/uL (ref 150–400)
RBC: 4.29 MIL/uL (ref 4.22–5.81)
RDW: 17.9 % — AB (ref 11.5–15.5)
WBC: 8.9 10*3/uL (ref 4.0–10.5)

## 2015-08-08 LAB — RENAL FUNCTION PANEL
ALBUMIN: 3.2 g/dL — AB (ref 3.5–5.0)
Anion gap: 13 (ref 5–15)
BUN: 69 mg/dL — AB (ref 6–20)
CHLORIDE: 107 mmol/L (ref 101–111)
CO2: 17 mmol/L — ABNORMAL LOW (ref 22–32)
CREATININE: 5.8 mg/dL — AB (ref 0.61–1.24)
Calcium: 8.2 mg/dL — ABNORMAL LOW (ref 8.9–10.3)
GFR, EST AFRICAN AMERICAN: 10 mL/min — AB (ref >60)
GFR, EST NON AFRICAN AMERICAN: 8 mL/min — AB (ref >60)
Glucose, Bld: 88 mg/dL (ref 65–99)
PHOSPHORUS: 5.6 mg/dL — AB (ref 2.5–4.6)
POTASSIUM: 4.9 mmol/L (ref 3.5–5.1)
Sodium: 137 mmol/L (ref 135–145)

## 2015-08-08 LAB — PROTIME-INR
INR: 1.25 (ref 0.00–1.49)
PROTHROMBIN TIME: 15.8 s — AB (ref 11.6–15.2)

## 2015-08-08 LAB — HEPARIN LEVEL (UNFRACTIONATED)
HEPARIN UNFRACTIONATED: 0.47 [IU]/mL (ref 0.30–0.70)
Heparin Unfractionated: 0.75 IU/mL — ABNORMAL HIGH (ref 0.30–0.70)

## 2015-08-08 MED ORDER — RESOURCE THICKENUP CLEAR PO POWD
ORAL | Status: DC | PRN
  Filled 2015-08-08: qty 125

## 2015-08-08 MED ORDER — WARFARIN SODIUM 2.5 MG PO TABS
2.5000 mg | ORAL_TABLET | Freq: Once | ORAL | Status: AC
  Administered 2015-08-08: 2.5 mg via ORAL
  Filled 2015-08-08: qty 1

## 2015-08-08 MED ORDER — CARVEDILOL 6.25 MG PO TABS
6.2500 mg | ORAL_TABLET | Freq: Two times a day (BID) | ORAL | Status: DC
  Administered 2015-08-08 – 2015-08-12 (×9): 6.25 mg via ORAL
  Filled 2015-08-08 (×8): qty 1

## 2015-08-08 NOTE — Evaluation (Signed)
Clinical/Bedside Swallow Evaluation Patient Details  Name: [name] GammaClarence E Mendonsa MRN: [id number] Date of Birth: 10/08/1933  Today's Date: 08/08/2015 Time: SLP Start Time (ACUTE ONLY): 1718 SLP Stop Time (ACUTE ONLY): 1730 SLP Time Calculation (min) (ACUTE ONLY): 12 min  Past Medical History:  Past Medical History  Diagnosis Date  . Hypertension   . Renal disorder     renal failure  . CHF (congestive heart failure) ([facility])   . Dementia    Past Surgical History:  Past Surgical History  Procedure Laterality Date  . Cardiac surgery    . Cardiac valve surgery    . Femoral artery stent      right leg per pt   HPI:  81 y.o. male with past medical history of CKD stage 4-5, dementia, CHF and hypertension admitted with dizziness. MRI no acute infarct. Remote bilateral centrum semiovale and basal ganglia infarcts. CXR Stable cardiomegaly Stable left pleural thickening versus effusion. Per MD note pt diagnosed with pneumonia in the ED, however CT scan does not show any evidence of pneumonia, no pleural effusion, no vascular congestion.   Assessment / Plan / Recommendation Clinical Impression  Immediate cough/throat clear and congestion following each sip thin liquid. Mild buccal cavity residue cleared with verbal cueing. Given question of pna recommend nectar thick liquids and Dys 3 diet. Wil follow up 10/17 for ability to return to thin liquids versus objective assessment.       Aspiration Risk   (mild-mod)    Diet Recommendation Dysphagia 3 (Mech soft);Nectar   Medication Administration: Whole meds with puree Compensations: Small sips/bites;Check for pocketing;Slow rate    Other  Recommendations Oral Care Recommendations: Oral care BID   Follow Up Recommendations       Frequency and Duration min 2x/week  2 weeks   Pertinent Vitals/Pain none    SLP Swallow Goals     Swallow Study Prior Functional Status       General Other Pertinent Information: 81 y.o. male with past  medical history of CKD stage 4-5, dementia, CHF and hypertension admitted with dizziness. MRI no acute infarct. Remote bilateral centrum semiovale and basal ganglia infarcts. CXR Stable cardiomegaly Stable left pleural thickening versus effusion. Per MD note pt diagnosed with pneumonia in the ED, however CT scan does not show any evidence of pneumonia, no pleural effusion, no vascular congestion. Type of Study: Bedside swallow evaluation Previous Swallow Assessment:  (none) Diet Prior to this Study: Regular;Thin liquids Temperature Spikes Noted: No Respiratory Status: Room air History of Recent Intubation: No Behavior/Cognition: Alert;Cooperative;Pleasant mood Oral Cavity - Dentition:  (upper plate) Self-Feeding Abilities: Able to feed self Patient Positioning: Upright in chair/Tumbleform Baseline Vocal Quality: Normal Volitional Cough: Strong Volitional Swallow: Able to elicit    Oral/Motor/Sensory Function Overall Oral Motor/Sensory Function: Appears within functional limits for tasks assessed   Ice Chips Ice chips: Not tested   Thin Liquid Thin Liquid: Impaired Presentation: Cup Pharyngeal  Phase Impairments: Cough - Immediate    Nectar Thick Nectar Thick Liquid: Not tested   Honey Thick Honey Thick Liquid: Not tested   Puree Puree: Not tested   Solid   GO    Solid: Impaired Oral Phase Impairments: Reduced lingual movement/coordination Oral Phase Functional Implications: Oral residue (min )       Jowel Waltner, Breck CoonsLisa Willis 08/08/2015,5:57 PM Breck CoonsLisa Willis Lonell FaceLitaker M.Ed [employer] Pager [phone]

## 2015-08-08 NOTE — Progress Notes (Signed)
Subjective:  More alert and awake today up in chair eating breakfast. Complains of coughing with chest congestion. No chest pain. Remains in atrial flutter with variable block  Objective:  Vital Signs in the last 24 hours: Temp:  [97.5 F (36.4 C)-97.7 F (36.5 C)] 97.6 F (36.4 C) (10/15 0952) Pulse Rate:  [56-117] 56 (10/15 0952) Resp:  [14-20] 20 (10/15 0952) BP: (97-167)/(53-113) 146/94 mmHg (10/15 0952) SpO2:  [96 %-99 %] 98 % (10/15 0952) Weight:  [66.996 kg (147 lb 11.2 oz)] 66.996 kg (147 lb 11.2 oz) (10/14 2040)  Intake/Output from previous day: 10/14 0701 - 10/15 0700 In: 1789.5 [P.O.:522; I.V.:1267.5] Out: 600 [Urine:600] Intake/Output from this shift: Total I/O In: 303.9 [P.O.:120; I.V.:183.9] Out: -   Physical Exam: Neck: no adenopathy, no carotid bruit, no JVD and supple, symmetrical, trachea midline Lungs: Decreased breath sound at bases with occasional rhonchi Heart: regularly irregular rhythm, S1, S2 normal and Soft systolic murmur noted Abdomen: soft, non-tender; bowel sounds normal; no masses,  no organomegaly Extremities: extremities normal, atraumatic, no cyanosis or edema  Lab Results:  Recent Labs  08/07/15 0555 08/08/15 0547  WBC 6.9 8.9  HGB 12.5* 13.1  PLT 124* 172    Recent Labs  08/07/15 0555 08/08/15 0547  NA 136 137  K 4.4 4.9  CL 108 107  CO2 16* 17*  GLUCOSE 115* 88  BUN 66* 69*  CREATININE 5.64* 5.80*    Recent Labs  08/06/15 1326 08/06/15 1829  TROPONINI 0.18* 0.21*   Hepatic Function Panel  Recent Labs  08/07/15 0555 08/08/15 0547  PROT 6.3*  --   ALBUMIN 3.3* 3.2*  AST 17  --   ALT 13*  --   ALKPHOS 40  --   BILITOT 0.8  --    No results for input(s): CHOL in the last 72 hours. No results for input(s): PROTIME in the last 72 hours.  Imaging: Imaging results have been reviewed and Dg Chest 1 View  08/06/2015  CLINICAL DATA:  Dyspnea. EXAM: CHEST 1 VIEW COMPARISON:  CT of the chest 08/05/2015 FINDINGS:  Postsurgical changes from prior aortic valve replacement and probable CABG are stable. Cardiomediastinal silhouette is stably enlarged. Mediastinal contours appear intact. There is no evidence of focal airspace consolidation or pneumothorax. There is persistent left pleural thickening versus effusion. Osseous structures are without acute abnormality. Extensive osteoarthritic changes of bilateral glenohumeral joints are seen. Soft tissues are grossly normal. IMPRESSION: Stable cardiomegaly. Stable left pleural thickening versus effusion. Electronically Signed   By: Ted Mcalpine M.D.   On: 08/06/2015 17:38   Mr Brain Wo Contrast  08/06/2015  CLINICAL DATA:  81 year old hypertensive male with dementia and chronic kidney disease presenting with dizziness. Subsequent encounter. EXAM: MRI HEAD WITHOUT CONTRAST TECHNIQUE: Multiplanar, multiecho pulse sequences of the brain and surrounding structures were obtained without intravenous contrast. COMPARISON:  08/05/2015 head CT. FINDINGS: No acute infarct. Remote bilateral centrum semiovale and basal ganglia infarcts. Remote left cerebellar infarct. Small vessel disease type changes. Blood breakdown products posterior left corona radiata and left occipital lobe probably related to prior episodes of hemorrhagic ischemia. Moderate global atrophy without hydrocephalus. No intracranial mass lesion noted on this unenhanced exam. Ectatic patent major intracranial arterial structures. Cervical medullary junction, pituitary region, pineal region and orbital structures unremarkable. IMPRESSION: No acute infarct. Remote bilateral centrum semiovale and basal ganglia infarcts. Remote left cerebellar infarct. Small vessel disease type changes. Moderate global atrophy without hydrocephalus. Electronically Signed   By: Ernie Hew.D.  On: 08/06/2015 21:52   Nm Pulmonary Perf And Vent  08/07/2015  CLINICAL DATA:  Shortness breath.  Dizziness for 2 weeks. EXAM: NUCLEAR  MEDICINE VENTILATION - PERFUSION LUNG SCAN TECHNIQUE: Ventilation images were obtained in multiple projections using inhaled aerosol Tc-5712m DTPA. Perfusion images were obtained in multiple projections after intravenous injection of Tc-3412m MAA. RADIOPHARMACEUTICALS:  40 Technetium-412m DTPA aerosol inhalation and 6.0 Technetium-4812m MAA IV COMPARISON:  One-view chest x-ray from the same day. FINDINGS: Ventilation: There is absent undulation in the left lower lobe. There is also segmental defect in the superior segment of the left upper lobe. The right lung uptake is normal. Perfusion: Absent per fusion is matched in the left lower lobe. A matched defect is present in the superior segment of the left upper lobe. The right lung perfuses normally. There is some clumping of radiopharmaceutical at the hilum. IMPRESSION: 1. Matched left lower lobe and left upper lobe defects. This represents a very low probability (0 - 9%) of pulmonary embolus using Pioped II criteria. Electronically Signed   By: Marin Robertshristopher  Mattern M.D.   On: 08/07/2015 10:22    Cardiac Studies:  Assessment/Plan:  New-onset atrial flutter with RVR.[email or web address] score of 8 Coronary artery disease status post CABG in the past. History of severe aortic stenosis, status post bioprosthetic aortic valve replacement. Minimally elevated troponin I secondary to progressive renal failure/tachycardia without significant MI Bronchitis rule out pneumonia Hypertension. Diabetes mellitus History of nonsustained ventricular tachycardia and SVT in the past. Chronic progressive kidney disease stage IV. Hyperlipidemia. Dementia. Chronic thrombocytopenia History of old CVA Plan Increase carvedilol as per orders   LOS: 3 days    [name] [name], [name] [name] 08/08/2015, 11:22 AM

## 2015-08-08 NOTE — Progress Notes (Signed)
[facility] PROGRESS NOTE  [name] [name] AVW:[id number] DOB: 11/19/1932 DOA: 08/05/2015 PCP: [name], Mohan, MD  Length of stay: 3   Assessment/Plan: Principal Problem:   Dizziness Active Problems:   Chronic kidney disease (CKD), stage IV (severe) (HCC)   Pneumonia   Diabetes mellitus type 2, diet-controlled (HCC)   CAD (coronary artery disease)   Status post aortic valve replacement   Protein-calorie malnutrition, severe     Acute on chronic kidney disease stage V Patient creatinine baseline was 4.5 about 2 years ago in May 2014. Patient followed by Dr.Colodonato Creatinine around 5.5, most likely the patient's new baseline, nephrology consulted Because of dementia  Nephrology does  not recommend any dialysis at this time , no significant improvement overnight after IV fluids. Discontinue Lasix  and  continue gentle IV fluids   Probable community-acquired pneumonia Diagnosed with pneumonia in the ED, however CT scan does not show any evidence of pneumonia, no pleural effusion, no vascular congestion Concern for aspiration, however patient is not hypoxic and appears comfortable, therefore discontinued antibiotics Speech therapy consultation will be obtained   continue bronchodilators and mucolytic  Chronic systolic/diastolic heart failure, does not appear to be in acute exacerbation   EF 45-50% on repeat echo, Lasix discontinued given low BP Hold bidil  Lightheadedness, could be related to new onset atrial flutter ,Cardiology Dr. Particia JasperHarwanii has been consulted Tachycardic into the 120s, EKG shows atrial fibrillation from 10/13, repeat EKG today for comparison, continue to cycle cardiac enzymes, 2-D echo shows EF of 45-50%, continue telemetry PT/OT to evaluate and treat. Once cardiac etiology was ruled out MRI of the brain negative for acute CVA but showed old infarcts   New-onset atrial flutter with RVR.History of severe aortic stenosis, status post  bioprosthetic aortic valve replacement chadsvas score of 8 Cardiology following, increase Coreg to 6.25 twice a day  cont  amiodarone., Dose increased to 200 twice a day yesterday VQ scan negative for PE Cardiology recommends anticoagulation with heparin drip/Coumadin   Hypertension controlled continue Coreg and hold  BiDil  Diabetes mellitus type 2 Diet-controlled, hemoglobin A1c  5.5, carbohydrate modified diet.  Status post aortic valve replacement Bioprosthetic valve, no indication for anticoagulation.     DVT prophylaxsis heparin  Code Status:      Code Status Orders        Start     Ordered   08/05/15 1953  Full code   Continuous     08/05/15 1952     Family Communication: family updated about patient's clinical progress Disposition Plan: Anticipate discharge to SNF in 2-3 days    Brief narrative: [name] GammaClarence E Stoll is a 81 y.o. male with past medical history of CKD stage 4-5, dementia, CHF and hypertension. Patient presented with dizziness. Patient seen with his sister-in-law at bedside, mentioned that he have dizziness for the past 2 weeks comes and goes increase with ambulation, upon further questioning this dizziness is likely lightheadedness. Patient also was started to have some cough with clear sputum. Patient called his cardiologist today who asked him to come to the hospital for further evaluation. In the ED was found to have creatinine worsening to 5.5, tachycardiac with lower extremity swelling, CT scan showed no acute events, CXR showed mild loculated effusion in the left side with cardiomegaly. Patient admitted to the hospital for further evaluation.  Consultants:  None  Procedures:  None  Antibiotics: Anti-infectives    Start     Dose/Rate Route Frequency Ordered  Stop   08/07/15 2200  azithromycin (ZITHROMAX) tablet 500 mg  Status:  Discontinued     500 mg Oral Daily at bedtime 08/07/15 0918 08/07/15 1315   08/06/15 1600  cefTRIAXone  (ROCEPHIN) 1 g in dextrose 5 % 50 mL IVPB  Status:  Discontinued     1 g 100 mL/hr over 30 Minutes Intravenous Every 24 hours 08/05/15 1952 08/07/15 1315   08/05/15 2200  azithromycin (ZITHROMAX) 500 mg in dextrose 5 % 250 mL IVPB  Status:  Discontinued     500 mg 250 mL/hr over 60 Minutes Intravenous Every 24 hours 08/05/15 1952 08/07/15 0918   08/05/15 1545  cefTRIAXone (ROCEPHIN) 1 g in dextrose 5 % 50 mL IVPB     1 g 100 mL/hr over 30 Minutes Intravenous  Once 08/05/15 1535 08/05/15 1751         HPI/Subjective:      Objective: Filed Vitals:   08/07/15 1831 08/07/15 2040 08/08/15 0502 08/08/15 0952  BP: 137/92 126/93 167/107 146/94  Pulse:  110 113 56  Temp:  97.7 F (36.5 C) 97.5 F (36.4 C) 97.6 F (36.4 C)  TempSrc:  Oral Oral Oral  Resp:  Height:      Weight:  66.996 kg (147 lb 11.2 oz)    SpO2:  99% 99% 98%    Intake/Output Summary (Last 24 hours) at 08/08/15 1152 Last data filed at 08/08/15 1031  Gross per 24 hour  Intake 1973.41 ml  Output      0 ml  Net 1973.41 ml    Exam:  General: No acute respiratory distress Lungs: Clear to auscultation bilaterally without wheezes or crackles Cardiovascular: Regular rate and rhythm without murmur gallop or rub normal S1 and S2 Abdomen: Nontender, nondistended, soft, bowel sounds positive, no rebound, no ascites, no appreciable mass Extremities: No significant cyanosis, clubbing, or edema bilateral lower extremities     Data Review   Micro Results No results found for this or any previous visit (from the past 240 hour(s)).  Radiology Reports Dg Chest 1 View  08/06/2015  CLINICAL DATA:  Dyspnea. EXAM: CHEST 1 VIEW COMPARISON:  CT of the chest 08/05/2015 FINDINGS: Postsurgical changes from prior aortic valve replacement and probable CABG are stable. Cardiomediastinal silhouette is stably enlarged. Mediastinal contours appear intact. There is no evidence of focal airspace consolidation or  pneumothorax. There is persistent left pleural thickening versus effusion. Osseous structures are without acute abnormality. Extensive osteoarthritic changes of bilateral glenohumeral joints are seen. Soft tissues are grossly normal. IMPRESSION: Stable cardiomegaly. Stable left pleural thickening versus effusion. Electronically Signed   By: Ted Mcalpine M.D.   On: 08/06/2015 17:38   Dg Chest 2 View  08/05/2015  CLINICAL DATA:  Three-week history of weakness and dizziness. Cough. EXAM: CHEST  2 VIEW COMPARISON:  Mar 01, 2013 FINDINGS: There is mild loculated effusion at the left base. There is no edema or consolidation. Heart is mildly enlarged with pulmonary vascularity within normal limits. No adenopathy. Patient is status post aortic valve replacement and coronary artery bypass grafting. No adenopathy. There is degenerative change in the thoracic spine. IMPRESSION: Mild loculated effusion left base. No edema or consolidation. Stable cardiomegaly. Electronically Signed   By: Bretta Bang III M.D.   On: 08/05/2015 15:05   Ct Head Wo Contrast  08/05/2015  CLINICAL DATA:  Intermittent dizziness for 3 weeks, confusion. Decreased appetite, weight loss. Headache. EXAM: CT HEAD WITHOUT CONTRAST TECHNIQUE: Contiguous axial images were obtained  from the base of the skull through the vertex without intravenous contrast. COMPARISON:  None. FINDINGS: Old bilateral basal ganglia lacune or infarcts. Low-density throughout the deep white matter compatible with chronic small vessel disease. No acute infarction. No hemorrhage or hydrocephalus. No mass lesion or midline shift. No acute calvarial abnormality. Visualized paranasal sinuses and mastoids clear. Orbital soft tissues unremarkable. IMPRESSION: Old bilateral basal ganglia lacunar infarcts. No acute intracranial abnormality. Atrophy, chronic microvascular disease. Electronically Signed   By: Charlett Nose M.D.   On: 08/05/2015 16:59   Ct Chest Wo  Contrast  08/05/2015  CLINICAL DATA:  81 year old male with cough, chills and possible left pleural effusion on recent chest radiograph EXAM: CT CHEST WITHOUT CONTRAST TECHNIQUE: Multidetector CT imaging of the chest was performed following the standard protocol without IV contrast. COMPARISON:  08/05/2015 and prior radiographs.  09/04/2006 CT FINDINGS: Mediastinum/Nodes: Cardiomegaly and aortic valve replacement again noted. Heavy coronary artery calcifications are identified. An ascending thoracic aortic aneurysm measuring 5 cm is unchanged. There is no evidence of pericardial effusion or enlarged lymph nodes. Lungs/Pleura: Unchanged left pleural thickening accounts for the recent radiographic abnormality. There is no evidence of pleural effusion. Mild bibasilar scarring is present. Moderate centrilobular emphysema is identified. There is no evidence of airspace disease, suspicious nodule, consolidation or mass. There is no evidence of pneumothorax. Upper abdomen: No acute abnormalities. Musculoskeletal: No acute or suspicious abnormalities. Mild degenerative disc disease in the lower thoracic spine noted. IMPRESSION: No evidence of acute abnormality. Chronic left pleural thickening accounts for abnormality identified on recent chest radiograph. No pleural effusion. Unchanged 5 cm ascending thoracic aortic aneurysm, cardiomegaly and coronary artery disease. Emphysema. Electronically Signed   By: Harmon Pier M.D.   On: 08/05/2015 20:54   Mr Brain Wo Contrast  08/06/2015  CLINICAL DATA:  81 year old hypertensive male with dementia and chronic kidney disease presenting with dizziness. Subsequent encounter. EXAM: MRI HEAD WITHOUT CONTRAST TECHNIQUE: Multiplanar, multiecho pulse sequences of the brain and surrounding structures were obtained without intravenous contrast. COMPARISON:  08/05/2015 head CT. FINDINGS: No acute infarct. Remote bilateral centrum semiovale and basal ganglia infarcts. Remote left  cerebellar infarct. Small vessel disease type changes. Blood breakdown products posterior left corona radiata and left occipital lobe probably related to prior episodes of hemorrhagic ischemia. Moderate global atrophy without hydrocephalus. No intracranial mass lesion noted on this unenhanced exam. Ectatic patent major intracranial arterial structures. Cervical medullary junction, pituitary region, pineal region and orbital structures unremarkable. IMPRESSION: No acute infarct. Remote bilateral centrum semiovale and basal ganglia infarcts. Remote left cerebellar infarct. Small vessel disease type changes. Moderate global atrophy without hydrocephalus. Electronically Signed   By: Lacy Duverney M.D.   On: 08/06/2015 21:52   Nm Pulmonary Perf And Vent  08/07/2015  CLINICAL DATA:  Shortness breath.  Dizziness for 2 weeks. EXAM: NUCLEAR MEDICINE VENTILATION - PERFUSION LUNG SCAN TECHNIQUE: Ventilation images were obtained in multiple projections using inhaled aerosol Tc-34m DTPA. Perfusion images were obtained in multiple projections after intravenous injection of Tc-63m MAA. RADIOPHARMACEUTICALS:  40 Technetium-58m DTPA aerosol inhalation and 6.0 Technetium-4m MAA IV COMPARISON:  One-view chest x-ray from the same day. FINDINGS: Ventilation: There is absent undulation in the left lower lobe. There is also segmental defect in the superior segment of the left upper lobe. The right lung uptake is normal. Perfusion: Absent per fusion is matched in the left lower lobe. A matched defect is present in the superior segment of the left upper lobe. The right lung perfuses normally. There  is some clumping of radiopharmaceutical at the hilum. IMPRESSION: 1. Matched left lower lobe and left upper lobe defects. This represents a very low probability (0 - 9%) of pulmonary embolus using Pioped II criteria. Electronically Signed   By: Marin Roberts M.D.   On: 08/07/2015 10:22     CBC  Recent Labs Lab 08/05/15 1610  08/06/15 0650 08/07/15 0555 08/08/15 0547  WBC 7.3 6.9 6.9 8.9  HGB 13.8 12.0* 12.5* 13.1  HCT 41.2 36.6* 37.2* 39.9  PLT 130* 123* 124* 172  MCV 91.8 92.9 91.2 93.0  MCH 30.7 30.5 30.6 30.5  MCHC 33.5 32.8 33.6 32.8  RDW 17.9* 17.9* 17.8* 17.9*    Chemistries   Recent Labs Lab 08/05/15 1610 08/06/15 0650 08/07/15 0555 08/08/15 0547  NA 140 144 136 137  K 4.9 4.6 4.4 4.9  CL 110 111 108 107  CO2 22 19* 16* 17*  GLUCOSE 125* 95 115* 88  BUN 54* 61* 66* 69*  CREATININE 5.55* 5.30* 5.64* 5.80*  CALCIUM 9.0 8.8* 8.2* 8.2*  AST  --   --  17  --   ALT  --   --  13*  --   ALKPHOS  --   --  40  --   BILITOT  --   --  0.8  --    ------------------------------------------------------------------------------------------------------------------ estimated creatinine clearance is 9.6 mL/min (by C-G formula based on Cr of 5.8). ------------------------------------------------------------------------------------------------------------------  Recent Labs  08/05/15 2110  HGBA1C 5.5   ------------------------------------------------------------------------------------------------------------------ No results for input(s): CHOL, HDL, LDLCALC, TRIG, CHOLHDL, LDLDIRECT in the last 72 hours. ------------------------------------------------------------------------------------------------------------------  Recent Labs  08/07/15 1605  TSH 0.918   ------------------------------------------------------------------------------------------------------------------ No results for input(s): VITAMINB12, FOLATE, FERRITIN, TIBC, IRON, RETICCTPCT in the last 72 hours.  Coagulation profile  Recent Labs Lab 08/05/15 2110 08/08/15 0655  INR 1.18 1.25    No results for input(s): DDIMER in the last 72 hours.  Cardiac Enzymes  Recent Labs Lab 08/05/15 2110 08/06/15 1326 08/06/15 1829  TROPONINI 0.33* 0.18* 0.21*    ------------------------------------------------------------------------------------------------------------------ Invalid input(s): POCBNP   CBG:  Recent Labs Lab 08/05/15 1445  GLUCAP 162*       Studies: Dg Chest 1 View  08/06/2015  CLINICAL DATA:  Dyspnea. EXAM: CHEST 1 VIEW COMPARISON:  CT of the chest 08/05/2015 FINDINGS: Postsurgical changes from prior aortic valve replacement and probable CABG are stable. Cardiomediastinal silhouette is stably enlarged. Mediastinal contours appear intact. There is no evidence of focal airspace consolidation or pneumothorax. There is persistent left pleural thickening versus effusion. Osseous structures are without acute abnormality. Extensive osteoarthritic changes of bilateral glenohumeral joints are seen. Soft tissues are grossly normal. IMPRESSION: Stable cardiomegaly. Stable left pleural thickening versus effusion. Electronically Signed   By: Ted Mcalpine M.D.   On: 08/06/2015 17:38   Mr Brain Wo Contrast  08/06/2015  CLINICAL DATA:  82 year old hypertensive male with dementia and chronic kidney disease presenting with dizziness. Subsequent encounter. EXAM: MRI HEAD WITHOUT CONTRAST TECHNIQUE: Multiplanar, multiecho pulse sequences of the brain and surrounding structures were obtained without intravenous contrast. COMPARISON:  08/05/2015 head CT. FINDINGS: No acute infarct. Remote bilateral centrum semiovale and basal ganglia infarcts. Remote left cerebellar infarct. Small vessel disease type changes. Blood breakdown products posterior left corona radiata and left occipital lobe probably related to prior episodes of hemorrhagic ischemia. Moderate global atrophy without hydrocephalus. No intracranial mass lesion noted on this unenhanced exam. Ectatic patent major intracranial arterial structures. Cervical medullary junction, pituitary region, pineal region and orbital structures  unremarkable. IMPRESSION: No acute infarct. Remote bilateral  centrum semiovale and basal ganglia infarcts. Remote left cerebellar infarct. Small vessel disease type changes. Moderate global atrophy without hydrocephalus. Electronically Signed   By: Lacy Duverney M.D.   On: 08/06/2015 21:52   Nm Pulmonary Perf And Vent  08/07/2015  CLINICAL DATA:  Shortness breath.  Dizziness for 2 weeks. EXAM: NUCLEAR MEDICINE VENTILATION - PERFUSION LUNG SCAN TECHNIQUE: Ventilation images were obtained in multiple projections using inhaled aerosol Tc-63m DTPA. Perfusion images were obtained in multiple projections after intravenous injection of Tc-56m MAA. RADIOPHARMACEUTICALS:  40 Technetium-5m DTPA aerosol inhalation and 6.0 Technetium-10m MAA IV COMPARISON:  One-view chest x-ray from the same day. FINDINGS: Ventilation: There is absent undulation in the left lower lobe. There is also segmental defect in the superior segment of the left upper lobe. The right lung uptake is normal. Perfusion: Absent per fusion is matched in the left lower lobe. A matched defect is present in the superior segment of the left upper lobe. The right lung perfuses normally. There is some clumping of radiopharmaceutical at the hilum. IMPRESSION: 1. Matched left lower lobe and left upper lobe defects. This represents a very low probability (0 - 9%) of pulmonary embolus using Pioped II criteria. Electronically Signed   By: Marin Roberts M.D.   On: 08/07/2015 10:22      Lab Results  Component Value Date   HGBA1C 5.5 08/05/2015   HGBA1C 5.9* 03/30/2012   Lab Results  Component Value Date   CREATININE 5.80* 08/08/2015       Scheduled Meds: . amiodarone  200 mg Oral BID  . calcitRIOL  0.25 mcg Oral Daily  . carvedilol  6.25 mg Oral BID WC  . donepezil  5 mg Oral QHS  . feeding supplement (NEPRO CARB STEADY)  237 mL Oral Q24H  . sodium chloride  3 mL Intravenous Q12H  . warfarin  2.5 mg Oral ONCE-1800  . Warfarin - Pharmacist Dosing Inpatient   Does not apply q1800   Continuous  Infusions: . sodium chloride 75 mL/hr at 08/07/15 2355  . heparin 650 Units/hr (08/08/15 1035)    Principal Problem:   Dizziness Active Problems:   Chronic kidney disease (CKD), stage IV (severe) (HCC)   Pneumonia   Diabetes mellitus type 2, diet-controlled (HCC)   CAD (coronary artery disease)   Status post aortic valve replacement   Protein-calorie malnutrition, severe    Time spent: 45 minutes   [facility]  [facility] Pager [phone]. If 7PM-7AM, please contact night-coverage at [email or web address], password [facility] 08/08/2015, 11:52 AM  LOS: 3 days

## 2015-08-08 NOTE — Progress Notes (Signed)
Patient ID: Elenora GammaClarence E Lovick, male   DOB: 12/23/1932, 81 y.o.   MRN: [id number] S:more awake and alert today, pleasantly demented O:BP 167/107 mmHg  Pulse 113  Temp(Src) 97.5 F (36.4 C) (Oral)  Resp 16  Ht 5\' 10"  (1.778 m)  Wt 66.996 kg (147 lb 11.2 oz)  BMI 21.19 kg/m2  SpO2 99%  Intake/Output Summary (Last 24 hours) at 08/08/15 0904 Last data filed at 08/08/15 0600  Gross per 24 hour  Intake 1789.53 ml  Output    200 ml  Net 1589.53 ml   Intake/Output: I/O last 3 completed shifts: In: 3302 [P.O.:642; I.V.:2410; IV Piggyback:250] Out: 650 [Urine:650]  Intake/Output this shift:    Weight change: 1.542 kg (3 lb 6.4 oz) Gen:WD WN frail elderly AAM in NAD CVS:no rub, IRR IRR Resp:cta NFA:OZHYQMAbd:benign VHQ:IONGEXBExt:minimal pretibial edema   Recent Labs Lab 08/05/15 1610 08/06/15 0650 08/07/15 0555 08/08/15 0547  NA 140 144 136 137  K 4.9 4.6 4.4 4.9  CL 110 111 108 107  CO2 22 19* 16* 17*  GLUCOSE 125* 95 115* 88  BUN 54* 61* 66* 69*  CREATININE 5.55* 5.30* 5.64* 5.80*  ALBUMIN  --   --  3.3* 3.2*  CALCIUM 9.0 8.8* 8.2* 8.2*  PHOS  --   --   --  5.6*  AST  --   --  17  --   ALT  --   --  13*  --    Liver Function Tests:  Recent Labs Lab 08/07/15 0555 08/08/15 0547  AST 17  --   ALT 13*  --   ALKPHOS 40  --   BILITOT 0.8  --   PROT 6.3*  --   ALBUMIN 3.3* 3.2*   No results for input(s): LIPASE, AMYLASE in the last 168 hours. No results for input(s): AMMONIA in the last 168 hours. CBC:  Recent Labs Lab 08/05/15 1610 08/06/15 0650 08/07/15 0555 08/08/15 0547  WBC 7.3 6.9 6.9 8.9  HGB 13.8 12.0* 12.5* 13.1  HCT 41.2 36.6* 37.2* 39.9  MCV 91.8 92.9 91.2 93.0  PLT 130* 123* 124* 172   Cardiac Enzymes:  Recent Labs Lab 08/05/15 2110 08/06/15 1326 08/06/15 1829  TROPONINI 0.33* 0.18* 0.21*   CBG:  Recent Labs Lab 08/05/15 1445  GLUCAP 162*    Iron Studies: No results for input(s): IRON, TIBC, TRANSFERRIN, FERRITIN in the last 72  hours. Studies/Results: Dg Chest 1 View  08/06/2015  CLINICAL DATA:  Dyspnea. EXAM: CHEST 1 VIEW COMPARISON:  CT of the chest 08/05/2015 FINDINGS: Postsurgical changes from prior aortic valve replacement and probable CABG are stable. Cardiomediastinal silhouette is stably enlarged. Mediastinal contours appear intact. There is no evidence of focal airspace consolidation or pneumothorax. There is persistent left pleural thickening versus effusion. Osseous structures are without acute abnormality. Extensive osteoarthritic changes of bilateral glenohumeral joints are seen. Soft tissues are grossly normal. IMPRESSION: Stable cardiomegaly. Stable left pleural thickening versus effusion. Electronically Signed   By: Ted Mcalpineobrinka  Dimitrova M.D.   On: 08/06/2015 17:38   Mr Brain Wo Contrast  08/06/2015  CLINICAL DATA:  81 year old hypertensive male with dementia and chronic kidney disease presenting with dizziness. Subsequent encounter. EXAM: MRI HEAD WITHOUT CONTRAST TECHNIQUE: Multiplanar, multiecho pulse sequences of the brain and surrounding structures were obtained without intravenous contrast. COMPARISON:  08/05/2015 head CT. FINDINGS: No acute infarct. Remote bilateral centrum semiovale and basal ganglia infarcts. Remote left cerebellar infarct. Small vessel disease type changes. Blood breakdown products posterior left corona radiata and  left occipital lobe probably related to prior episodes of hemorrhagic ischemia. Moderate global atrophy without hydrocephalus. No intracranial mass lesion noted on this unenhanced exam. Ectatic patent major intracranial arterial structures. Cervical medullary junction, pituitary region, pineal region and orbital structures unremarkable. IMPRESSION: No acute infarct. Remote bilateral centrum semiovale and basal ganglia infarcts. Remote left cerebellar infarct. Small vessel disease type changes. Moderate global atrophy without hydrocephalus. Electronically Signed   By: Lacy Duverney  M.D.   On: 08/06/2015 21:52   Nm Pulmonary Perf And Vent  08/07/2015  CLINICAL DATA:  Shortness breath.  Dizziness for 2 weeks. EXAM: NUCLEAR MEDICINE VENTILATION - PERFUSION LUNG SCAN TECHNIQUE: Ventilation images were obtained in multiple projections using inhaled aerosol Tc-2m DTPA. Perfusion images were obtained in multiple projections after intravenous injection of Tc-96m MAA. RADIOPHARMACEUTICALS:  40 Technetium-37m DTPA aerosol inhalation and 6.0 Technetium-79m MAA IV COMPARISON:  One-view chest x-ray from the same day. FINDINGS: Ventilation: There is absent undulation in the left lower lobe. There is also segmental defect in the superior segment of the left upper lobe. The right lung uptake is normal. Perfusion: Absent per fusion is matched in the left lower lobe. A matched defect is present in the superior segment of the left upper lobe. The right lung perfuses normally. There is some clumping of radiopharmaceutical at the hilum. IMPRESSION: 1. Matched left lower lobe and left upper lobe defects. This represents a very low probability (0 - 9%) of pulmonary embolus using Pioped II criteria. Electronically Signed   By: Marin Roberts M.D.   On: 08/07/2015 10:22   . amiodarone  200 mg Oral BID  . calcitRIOL  0.25 mcg Oral Daily  . carvedilol  6.25 mg Oral BID WC  . donepezil  5 mg Oral QHS  . feeding supplement (NEPRO CARB STEADY)  237 mL Oral Q24H  . sodium chloride  3 mL Intravenous Q12H  . Warfarin - Pharmacist Dosing Inpatient   Does not apply q1800    BMET    Component Value Date/Time   NA 137 08/08/2015 0547   K 4.9 08/08/2015 0547   CL 107 08/08/2015 0547   CO2 17* 08/08/2015 0547   GLUCOSE 88 08/08/2015 0547   BUN 69* 08/08/2015 0547   CREATININE 5.80* 08/08/2015 0547   CALCIUM 8.2* 08/08/2015 0547   GFRNONAA 8* 08/08/2015 0547   GFRAA 10* 08/08/2015 0547   CBC    Component Value Date/Time   WBC 8.9 08/08/2015 0547   RBC 4.29 08/08/2015 0547   HGB 13.1  08/08/2015 0547   HCT 39.9 08/08/2015 0547   PLT 172 08/08/2015 0547   MCV 93.0 08/08/2015 0547   MCH 30.5 08/08/2015 0547   MCHC 32.8 08/08/2015 0547   RDW 17.9* 08/08/2015 0547   LYMPHSABS 1.4 03/02/2013 0001   MONOABS 0.6 03/02/2013 0001   EOSABS 0.1 03/02/2013 0001   BASOSABS 0.0 03/02/2013 0001     Assessment/Plan: 1. AKI/CKD stage 4-5- in setting of poor po intake and new onset Atrial fibrillation. I had a lengthy discussion with Mr. Melhorn sister-in-law. We are both in agreement that Mr. Kozak has not been interested in renal replacement therapy of any kind and that if his kidney function were to continue to deteriorate, she would be amenable to hospice/palliative care evaluation.  1. For now would hold off on diuretics  2. Consult palliative care to help set goals/limits of care as he is still a full code (he would require hospice services if renal function continues to deteriorate)  2. New onset Atrial Fib/A flutter- increase coreg per cardiology 3. HTN- stable 4. Anemia of chronic disease- stable not on ESA 5. CHF- possibly related to new A fib. Await cardiology input. 6. AMS- pulled out his IV's earlier. Possibly related to dementia vs. delerium from acute illness. Also need to r/o infection. 7. Protein malnutrition- renal diet 8. Disposition- unclear if he is going to be able to remain independent. PT/OT eval.  Kery Haltiwanger A

## 2015-08-08 NOTE — Progress Notes (Addendum)
ANTICOAGULATION CONSULT NOTE   Pharmacy Consult for heparin and coumadin Indication: atrial fibrillation  Allergies  Allergen Reactions  . Clams [Shellfish Allergy]     Gout     Patient Measurements: Height: 5\' 10"  (177.8 cm) Weight: 147 lb 11.2 oz (66.996 kg) IBW/kg (Calculated) : 73   Vital Signs: Temp: 97.6 F (36.4 C) (10/15 0952) Temp Source: Oral (10/15 0952) BP: 146/94 mmHg (10/15 0952) Pulse Rate: 56 (10/15 0952)  Labs:  Recent Labs  08/05/15 2110 08/06/15 0650 08/06/15 1326 08/06/15 1829 08/07/15 0555 08/07/15 2246 08/08/15 0547 08/08/15 0655  HGB  --  12.0*  --   --  12.5*  --  13.1  --   HCT  --  36.6*  --   --  37.2*  --  39.9  --   PLT  --  123*  --   --  124*  --  172  --   LABPROT 15.2  --   --   --   --   --   --  15.8*  INR 1.18  --   --   --   --   --   --  1.25  HEPARINUNFRC  --   --   --   --   --  0.90*  --  0.75*  CREATININE  --  5.30*  --   --  5.64*  --  5.80*  --   TROPONINI 0.33*  --  0.18* 0.21*  --   --   --   --     Estimated Creatinine Clearance: 9.6 mL/min (by C-G formula based on Cr of 5.8).   Assessment: 81 yo M with dementia and ARF.  Pharmacy consulted to dose heparin and coumadin for new onset afib.   Wt 65.5 kg, Hg 13.8>12.5>13.1. PLCT  124 >172- appears to have chronic thrombocytopenia.  Discussed thrombocytopenia w/ Dr. Sharyn LullHarwani on 10/14.  Pt with high risk of having another stroke.  Per Renal MD family does not wish to start HD for his ARF. He is on amiodarone and s/p 2 doses of azithromycin which may increase INR.   HL = 0.75 after rate decreased from 900 to 750.  HL slightly above goal.  INR 1.25 after 1 dose of 2.5 mg of coumadin. No bleeding reported.   Goal of Therapy:  INR 2-3 Heparin level 0.3-0.7 units/ml Monitor platelets by anticoagulation protocol: Yes   Plan:  - decrease heparin to 650 units/hr and check 8 hr HL -coumadin 2.5 mg po x 1 dose -daily HL/CBC/INR - F/u plan of care - will not be able to  educate pt due to  dementia, will need to educate family about coumadin   Herby AbrahamMichelle T. Wendell Nicoson, Pharm.D. [phone] 08/08/2015 10:22 AM

## 2015-08-08 NOTE — Progress Notes (Signed)
ANTICOAGULATION CONSULT NOTE   Pharmacy Consult for Heparin Indication: atrial fibrillation  Allergies  Allergen Reactions  . Clams [Shellfish Allergy]     Gout     Patient Measurements: Height: 5\' 10"  (177.8 cm) Weight: 147 lb 11.2 oz (66.996 kg) IBW/kg (Calculated) : 73   Vital Signs: Temp: 97.4 F (36.3 C) (10/15 1811) Temp Source: Oral (10/15 1811) BP: 146/93 mmHg (10/15 1811) Pulse Rate: 109 (10/15 1811)  Labs:  Recent Labs  08/05/15 2110  08/06/15 0650 08/06/15 1326 08/06/15 1829 08/07/15 0555 08/07/15 2246 08/08/15 0547 08/08/15 0655 08/08/15 2010  HGB  --   < > 12.0*  --   --  12.5*  --  13.1  --   --   HCT  --   --  36.6*  --   --  37.2*  --  39.9  --   --   PLT  --   --  123*  --   --  124*  --  172  --   --   LABPROT 15.2  --   --   --   --   --   --   --  15.8*  --   INR 1.18  --   --   --   --   --   --   --  1.25  --   HEPARINUNFRC  --   --   --   --   --   --  0.90*  --  0.75* 0.47  CREATININE  --   --  5.30*  --   --  5.64*  --  5.80*  --   --   TROPONINI 0.33*  --   --  0.18* 0.21*  --   --   --   --   --   < > = values in this interval not displayed.  Estimated Creatinine Clearance: 9.6 mL/min (by C-G formula based on Cr of 5.8).   Assessment: 81 yo M with dementia and ARF.  Pharmacy consulted to dose heparin and coumadin for new onset afib.   Wt 65.5 kg, Hg 13.8>12.5>13.1. PLCT  124 >172- appears to have chronic thrombocytopenia.  Discussed thrombocytopenia w/ Dr. Sharyn LullHarwani on 10/14.  Pt with high risk of having another stroke.  Per Renal MD family does not wish to start HD for his ARF. He is on amiodarone and s/p 2 doses of azithromycin which may increase INR.    AM HL = 0.75 after rate decreased from 900 to 750.  HL slightly above goal.  INR 1.25 after 1 dose of 2.5 mg of coumadin. No bleeding reported.   PM Heparin level now therapeutic  Goal of Therapy:  INR 2-3 Heparin level 0.3-0.7 units/ml Monitor platelets by anticoagulation  protocol: Yes   Plan:  Continue heparin at 650 units / hr Follow up AM labs  Thank you Okey RegalLisa Denora Wysocki, PharmD [phone]  08/08/2015 8:41 PM

## 2015-08-09 DIAGNOSIS — R0600 Dyspnea, unspecified: Secondary | ICD-10-CM | POA: Insufficient documentation

## 2015-08-09 DIAGNOSIS — Z515 Encounter for palliative care: Secondary | ICD-10-CM | POA: Insufficient documentation

## 2015-08-09 LAB — PROTIME-INR
INR: 1.22 (ref 0.00–1.49)
Prothrombin Time: 15.5 seconds — ABNORMAL HIGH (ref 11.6–15.2)

## 2015-08-09 LAB — COMPREHENSIVE METABOLIC PANEL
ALK PHOS: 39 U/L (ref 38–126)
ALT: 11 U/L — AB (ref 17–63)
AST: 27 U/L (ref 15–41)
Albumin: 3.1 g/dL — ABNORMAL LOW (ref 3.5–5.0)
Anion gap: 11 (ref 5–15)
BUN: 76 mg/dL — ABNORMAL HIGH (ref 6–20)
CALCIUM: 8.3 mg/dL — AB (ref 8.9–10.3)
CO2: 16 mmol/L — AB (ref 22–32)
CREATININE: 5.93 mg/dL — AB (ref 0.61–1.24)
Chloride: 109 mmol/L (ref 101–111)
GFR, EST AFRICAN AMERICAN: 9 mL/min — AB (ref >60)
GFR, EST NON AFRICAN AMERICAN: 8 mL/min — AB (ref >60)
Glucose, Bld: 106 mg/dL — ABNORMAL HIGH (ref 65–99)
Potassium: 5.5 mmol/L — ABNORMAL HIGH (ref 3.5–5.1)
Sodium: 136 mmol/L (ref 135–145)
Total Bilirubin: 0.8 mg/dL (ref 0.3–1.2)
Total Protein: 6 g/dL — ABNORMAL LOW (ref 6.5–8.1)

## 2015-08-09 LAB — HEPARIN LEVEL (UNFRACTIONATED)
HEPARIN UNFRACTIONATED: 0.35 [IU]/mL (ref 0.30–0.70)
Heparin Unfractionated: 0.21 IU/mL — ABNORMAL LOW (ref 0.30–0.70)

## 2015-08-09 LAB — CBC
HCT: 40.2 % (ref 39.0–52.0)
Hemoglobin: 13.1 g/dL (ref 13.0–17.0)
MCH: 30.4 pg (ref 26.0–34.0)
MCHC: 32.6 g/dL (ref 30.0–36.0)
MCV: 93.3 fL (ref 78.0–100.0)
PLATELETS: 126 10*3/uL — AB (ref 150–400)
RBC: 4.31 MIL/uL (ref 4.22–5.81)
RDW: 17.5 % — AB (ref 11.5–15.5)
WBC: 7.1 10*3/uL (ref 4.0–10.5)

## 2015-08-09 MED ORDER — AMIODARONE HCL 200 MG PO TABS
400.0000 mg | ORAL_TABLET | Freq: Two times a day (BID) | ORAL | Status: DC
  Administered 2015-08-09 – 2015-08-12 (×6): 400 mg via ORAL
  Filled 2015-08-09 (×6): qty 2

## 2015-08-09 MED ORDER — HYDRALAZINE HCL 20 MG/ML IJ SOLN
10.0000 mg | Freq: Four times a day (QID) | INTRAMUSCULAR | Status: DC | PRN
  Administered 2015-08-09 – 2015-08-12 (×2): 10 mg via INTRAVENOUS
  Filled 2015-08-09 (×2): qty 1

## 2015-08-09 MED ORDER — SODIUM POLYSTYRENE SULFONATE 15 GM/60ML PO SUSP
30.0000 g | Freq: Once | ORAL | Status: AC
  Administered 2015-08-09: 30 g via ORAL
  Filled 2015-08-09: qty 120

## 2015-08-09 MED ORDER — FUROSEMIDE 10 MG/ML IJ SOLN
80.0000 mg | Freq: Once | INTRAMUSCULAR | Status: AC
  Administered 2015-08-09: 80 mg via INTRAVENOUS
  Filled 2015-08-09: qty 8

## 2015-08-09 MED ORDER — WARFARIN SODIUM 5 MG PO TABS
5.0000 mg | ORAL_TABLET | Freq: Once | ORAL | Status: AC
  Administered 2015-08-09: 5 mg via ORAL
  Filled 2015-08-09: qty 1

## 2015-08-09 MED ORDER — STERILE WATER FOR INJECTION IV SOLN
150.0000 meq | INTRAVENOUS | Status: DC
  Administered 2015-08-09 – 2015-08-12 (×4): 150 meq via INTRAVENOUS
  Filled 2015-08-09 (×11): qty 850

## 2015-08-09 NOTE — Consult Note (Signed)
Consultation Note Date: 08/09/2015   Patient Name: [name] [name]  DOB: February 08, 1933  MRN: [id number]  Age / Sex: 81 y.o., male   PCP: Charolette Forward, MD Referring Physician: Reyne Dumas, MD  Reason for Consultation: Establishing goals of care  Palliative Care Assessment and Plan Summary of Established Goals of Care and Medical Treatment Preferences   Clinical Assessment/Narrative: Pt is a 81 yo man with h/o dementia moderate impairment, CKD now stage 4-5, CAD, aortic valve replacement,HTN, DM2, combined CHF,  admitted after a 2-week period of dizziness, and cough. After admission it was noted increased creatinine from baseline, hyperkalemia as well as new atrial flutter. Pt is very pleasant, alert. Notable short and long term memory deficits. His SIL is his primary caregiver as well as other brothers, and family. He still lives alone in his home but SIL, Mrs. Brooke Dare shares she has been seeing a progressive decline in terms of incontinence, confusion, has been agitated at times as well as having hallucinations. He has a very congested cough that has also been going on for several weeks.   Contacts/Participants in Discussion: Primary Decision Maker: Mrs. Brooke Dare   HCPOA: Not clear if she legally has HCPOA but pt is widowed and never had children. Family in agreement with deferring to Mrs. Carlton Adam for his decision making Met with pt and Mrs. Carlton Adam  Code Status/Advance Care Planning:  DNR. Copy on shadow chart  No HD  Medical management for atrial fib/flutter  Medical management for chronic medical problems  Not full comfort care yet  Desires rehab at [facility] when maximum medical management achieved  Discussed role of hospice in facility or in-patient as I anticipate pt could decline quickly. Mrs. Thorne aware and understands how to access hospice after dc  Symptom Management:   Agitation: pt calm with people in the room. Would be cautious with neuroleptics  if he has Pick's Dementia as higher doses of this could worsen agitation. Low dose trazadone might be helpful 25-50 mg q6 prn  Palliative Prophylaxis: n/a. Pt given kayexelate for hyperkalemia  Additional Recommendations (Limitations, Scope, Preferences):  Discussed role of hospice after skilled needs met Psycho-social/Spiritual:   Support System: yes  Desire for further Chaplaincy support:no  Prognosis: < 6 months  Discharge Planning:  [place] for rehab with Palliative care service follow-up       Chief Complaint/History of Present Illness: Pt is a 81 yo man with moderate to severe dmentia, CAD, aortic valve replacement, CKD stage 4-5, combined CHF, HTN, admitted with new atrial flutter and worsening creatinine and hyperkalemia  Primary Diagnoses  Present on Admission:  . Pneumonia . Chronic kidney disease (CKD), stage IV (severe) ([place]) . CAD (coronary artery disease)  Palliative Review of Systems: Pleasantly confused I have reviewed the medical record, interviewed the patient and family, and examined the patient. The following aspects are pertinent.  Past Medical History  Diagnosis Date  . Hypertension   . Renal disorder     renal failure  . CHF (congestive heart failure) ([place])   . Dementia    Social History   Social History  . Marital Status: Married    Spouse Name: N/A  . Number of Children: N/A  . Years of Education: N/A   Social History Main Topics  . Smoking status: Former Smoker -- 0.25 packs/day for 15 years    Types: Cigarettes    Quit date: 08/10/1983  . Smokeless tobacco: Never Used  . Alcohol Use: No  .  Drug Use: No     Comment: ? if sneaks cigarettes per family occasionally  . Sexual Activity: No   Other Topics Concern  . None   Social History Narrative   History reviewed. No pertinent family history. Scheduled Meds: . amiodarone  400 mg Oral BID  . calcitRIOL  0.25 mcg Oral Daily  . carvedilol  6.25 mg Oral BID WC  .  donepezil  5 mg Oral QHS  . feeding supplement (NEPRO CARB STEADY)  237 mL Oral Q24H  . sodium chloride  3 mL Intravenous Q12H  . warfarin  5 mg Oral ONCE-1800  . Warfarin - Pharmacist Dosing Inpatient   Does not apply q1800   Continuous Infusions: . heparin 750 Units/hr (08/09/15 1206)  .  sodium bicarbonate 150 mEq in sterile water 1000 mL infusion 150 mEq (08/09/15 1332)   PRN Meds:.acetaminophen **OR** acetaminophen, metoprolol, morphine injection, ondansetron **OR** ondansetron (ZOFRAN) IV, RESOURCE THICKENUP CLEAR Medications Prior to Admission:  Prior to Admission medications   Medication Sig Start Date End Date Taking? Authorizing Provider  amiodarone (PACERONE) 200 MG tablet Take 1 tablet (200 mg total) by mouth daily. 03/09/13  Yes Charolette Forward, MD  calcitRIOL (ROCALTROL) 0.25 MCG capsule Take 0.25 mcg by mouth daily.  02/05/15  Yes Historical Provider, MD  carvedilol (COREG) 3.125 MG tablet Take 3.125 mg by mouth 2 (two) times daily with a meal.   Yes Historical Provider, MD  donepezil (ARICEPT) 5 MG tablet Take 5 mg by mouth at bedtime.   Yes Historical Provider, MD  furosemide (LASIX) 40 MG tablet Take 2 tablets (80 mg total) by mouth 2 (two) times daily. Patient taking differently: Take 40 mg by mouth 2 (two) times daily.  04/05/12  Yes Charolette Forward, MD  isosorbide-hydrALAZINE (BIDIL) 20-37.5 MG per tablet Take 1 tablet by mouth 3 (three) times daily.   Yes Historical Provider, MD  carvedilol (COREG) 3.125 MG tablet Take 1 tablet (3.125 mg total) by mouth every 12 (twelve) hours. 04/05/12 04/05/13  Charolette Forward, MD  memantine (NAMENDA) 5 MG tablet Take 1 tablet (5 mg total) by mouth daily. Patient not taking: Reported on 08/05/2015 03/09/13   Charolette Forward, MD   Allergies  Allergen Reactions  . Clams [Shellfish Allergy]     Gout    CBC:    Component Value Date/Time   WBC 7.1 08/09/2015 0623   HGB 13.1 08/09/2015 0623   HCT 40.2 08/09/2015 0623   PLT 126* 08/09/2015 0623    MCV 93.3 08/09/2015 0623   NEUTROABS 4.8 03/02/2013 0001   LYMPHSABS 1.4 03/02/2013 0001   MONOABS 0.6 03/02/2013 0001   EOSABS 0.1 03/02/2013 0001   BASOSABS 0.0 03/02/2013 0001   Comprehensive Metabolic Panel:    Component Value Date/Time   NA 136 08/09/2015 0800   K 5.5* 08/09/2015 0800   CL 109 08/09/2015 0800   CO2 16* 08/09/2015 0800   BUN 76* 08/09/2015 0800   CREATININE 5.93* 08/09/2015 0800   GLUCOSE 106* 08/09/2015 0800   CALCIUM 8.3* 08/09/2015 0800   AST 27 08/09/2015 0800   ALT 11* 08/09/2015 0800   ALKPHOS 39 08/09/2015 0800   BILITOT 0.8 08/09/2015 0800   PROT 6.0* 08/09/2015 0800   ALBUMIN 3.1* 08/09/2015 0800    Physical Exam: Vital Signs: BP 168/127 mmHg  Pulse 117  Temp(Src) 97.6 F (36.4 C) (Oral)  Resp 18  Ht _0  (1.778 m)  Wt 66.9 kg (147 lb 7.8 oz)  BMI 21.16 kg/m2  SpO2  100% SpO2: SpO2: 100 % O2 Device: O2 Device: Not Delivered O2 Flow Rate:   Intake/output summary:  Intake/Output Summary (Last 24 hours) at 08/09/15 1433 Last data filed at 08/09/15 1314  Gross per 24 hour  Intake 486.71 ml  Output    332 ml  Net 154.71 ml   LBM: Last BM Date: 08/08/15 Baseline Weight: Weight: 63.866 kg (140 lb 12.8 oz) Most recent weight: Weight: 66.9 kg (147 lb 7.8 oz)  Exam Findings:  General: Elderly man, alert in no acute distress Resp: Moist cough; upper airway congestion. ? Aspiration Neuro: Short and long term memory deficits present. Pt covers a lot for this with use of humor         Palliative Performance Scale: 50-60%              Additional Data Reviewed: Recent Labs     08/08/15  0547  08/09/15  0623  08/09/15  0800  WBC  8.9  7.1   --   HGB  13.1  13.1   --   PLT  172  126*   --   NA  137   --   136  BUN  69*   --   76*  CREATININE  5.80*   --   5.93*     Time In: 1300 Time Out: 1420 Time Total: 80 min Greater than 50%  of this time was spent counseling and coordinating care related to the above assessment and  plan.  Signed by: Dory Horn, NP  Dory Horn, NP  08/09/2015, 2:33 PM  Please contact Palliative Medicine Team phone at [phone] for questions and concerns.

## 2015-08-09 NOTE — Progress Notes (Signed)
Subjective:  Complains of cough with chest congestion. Remains in atrial flutter with rapid ventricular response  Objective:  Vital Signs in the last 24 hours: Temp:  [97.3 F (36.3 C)-98.3 F (36.8 C)] 97.6 F (36.4 C) (10/16 0902) Pulse Rate:  [73-117] 117 (10/16 0902) Resp:  [16-18] 18 (10/16 0902) BP: (139-168)/(64-127) 168/127 mmHg (10/16 0902) SpO2:  [90 %-100 %] 100 % (10/16 0902) Weight:  [66.9 kg (147 lb 7.8 oz)] 66.9 kg (147 lb 7.8 oz) (10/15 2142)  Intake/Output from previous day: 10/15 0701 - 10/16 0700 In: 790.6 [P.O.:480; I.V.:310.6] Out: 2 [Stool:2] Intake/Output from this shift: Total I/O In: 0  Out: 327 [Urine:325; Stool:2]  Physical Exam: Neck: no adenopathy, no carotid bruit, no JVD and supple, symmetrical, trachea midline Lungs: Bilateral rhonchi noted Heart: Bilateral rhonchi noted Abdomen: soft, non-tender; bowel sounds normal; no masses,  no organomegaly Extremities: extremities normal, atraumatic, no cyanosis or edema  Lab Results:  Recent Labs  08/08/15 0547 08/09/15 0623  WBC 8.9 7.1  HGB 13.1 13.1  PLT 172 126*    Recent Labs  08/08/15 0547 08/09/15 0800  NA 137 136  K 4.9 5.5*  CL 107 109  CO2 17* 16*  GLUCOSE 88 106*  BUN 69* 76*  CREATININE 5.80* 5.93*    Recent Labs  08/06/15 1326 08/06/15 1829  TROPONINI 0.18* 0.21*   Hepatic Function Panel  Recent Labs  08/09/15 0800  PROT 6.0*  ALBUMIN 3.1*  AST 27  ALT 11*  ALKPHOS 39  BILITOT 0.8   No results for input(s): CHOL in the last 72 hours. No results for input(s): PROTIME in the last 72 hours.  Imaging: Imaging results have been reviewed and No results found.  Cardiac Studies:  Assessment/Plan:  New-onset atrial flutter with [email or web address] score of 8 Coronary artery disease status post CABG in the past. History of severe aortic stenosis, status post bioprosthetic aortic valve replacement. Minimally elevated troponin I secondary to progressive renal  failure/tachycardia without significant MI Bronchitis rule out pneumonia Hypertension. Diabetes mellitus History of nonsustained ventricular tachycardia and SVT in the past. Chronic progressive kidney disease stage IV. Hyperlipidemia. Dementia. Chronic thrombocytopenia History of old CVA Hyperkalemia Plan Increase amiodarone as per orders Kayexalate as per orders  LOS: 4 days    [name] [name], [name] [name] 08/09/2015, 11:13 AM

## 2015-08-09 NOTE — Progress Notes (Signed)
ANTICOAGULATION CONSULT NOTE   Pharmacy Consult for Heparin Indication: atrial fibrillation  Allergies  Allergen Reactions  . Clams [Shellfish Allergy]     Gout     Patient Measurements: Height: 5\' 10"  (177.8 cm) Weight: 147 lb 7.8 oz (66.9 kg) IBW/kg (Calculated) : 73   Vital Signs: Temp: 97.6 F (36.4 C) (10/16 0902) Temp Source: Oral (10/16 0902) BP: 168/127 mmHg (10/16 0902) Pulse Rate: 117 (10/16 0902)  Labs:  Recent Labs  08/06/15 1326 08/06/15 1829  08/07/15 0555  08/08/15 0547 08/08/15 0655 08/08/15 2010 08/09/15 0623 08/09/15 0800  HGB  --   --   < > 12.5*  --  13.1  --   --  13.1  --   HCT  --   --   --  37.2*  --  39.9  --   --  40.2  --   PLT  --   --   --  124*  --  172  --   --  126*  --   LABPROT  --   --   --   --   --   --  15.8*  --  15.5*  --   INR  --   --   --   --   --   --  1.25  --  1.22  --   HEPARINUNFRC  --   --   --   --   < >  --  0.75* 0.47 0.21*  --   CREATININE  --   --   --  5.64*  --  5.80*  --   --   --  5.93*  TROPONINI 0.18* 0.21*  --   --   --   --   --   --   --   --   < > = values in this interval not displayed.  Estimated Creatinine Clearance: 9.4 mL/min (by C-G formula based on Cr of 5.93).   Assessment: 81 yo M with dementia and ARF.  Pharmacy consulted to dose heparin and coumadin for new onset afib.    High risk of another stroke. HL is now subtherapeutic this am at 0.21, INR subtherapeutic at 1.22. Will increase heparin back up to 750, monitor closely as he was supratherapeutic on this rate earlier this admission. Also on amio. Hgb ok at 13.1, plts low at 126. No s/s of bleed. RN did report patient pulled IV out this am but this was after low level was reported.  Goal of Therapy:  INR 2-3 Heparin level 0.3-0.7 units/ml Monitor platelets by anticoagulation protocol: Yes   Plan:  Increase heparin gtt to 750 units/hr Give coumadin 5mg  PO x 1 tonight Check 8 hr HL Monitor daily HL / INR, CBC, s/s of  bleed  Enzo BiNathan Miyoko Hashimi, PharmD Clinical Pharmacist Pager [phone] 08/09/2015 11:19 AM

## 2015-08-09 NOTE — Progress Notes (Signed)
[facility] PROGRESS NOTE  [name] [name] ZOX:[id number] DOB: 02-16-1933 DOA: 08/05/2015 PCP: Rinaldo Cloud, MD  Length of stay: 4   Assessment/Plan: Principal Problem:   Dizziness Active Problems:   Chronic kidney disease (CKD), stage IV (severe) (HCC)   Pneumonia   Diabetes mellitus type 2, diet-controlled (HCC)   CAD (coronary artery disease)   Status post aortic valve replacement   Protein-calorie malnutrition, severe   Dyspnea     Acute on chronic kidney disease stage V Patient creatinine baseline was 4.5 about 2 years ago in May 2014. Patient followed by Dr.Colodonato Creatinine around 5.5, most likely the patient's new baseline, nephrology consulted Because of dementia  Nephrology does  not recommend any dialysis at this time , no significant improvement overnight after IV fluids.    continue gentle IV fluids   Probable community-acquired pneumonia Diagnosed with pneumonia in the ED, however CT scan does not show any evidence of pneumonia, no pleural effusion, no vascular congestion Concern for aspiration, however patient is not hypoxic and appears comfortable, therefore discontinued antibiotics Speech therapy consultation Dysphagia 3 (Mech soft);Nectar   continue bronchodilators and mucolytic  Chronic systolic/diastolic heart failure, does not appear to be in acute exacerbation   EF 45-50% on repeat echo, Lasix discontinued given low BP Discontinued bidil due to hypotension on 10/14  Lightheadedness, could be related to new onset atrial flutter ,Cardiology Dr. Particia Jasper has been consulted Tachycardic into the 120s, EKG shows atrial fibrillation from 10/13, repeat EKG today for comparison, continue to cycle cardiac enzymes, 2-D echo shows EF of 45-50%, continue telemetry PT/OT recommended SNF. MRI of the brain negative for acute CVA but showed old infarcts   New-onset atrial flutter with RVR.History of severe aortic stenosis, status post  bioprosthetic aortic valve replacement chadsvas score of 8 Cardiology following, increase Coreg to 6.25 twice a day  cont  amiodarone., Dose increased to 200 twice a day yesterday VQ scan negative for PE Cardiology recommends anticoagulation with heparin drip/Coumadin Continue to follow platelet count closely given history of thrombocytopenia INR currently Subtherapeutic at 1.22   Hypertension controlled continue Coreg and hold  BiDil  Diabetes mellitus type 2 Diet-controlled, hemoglobin A1c  5.5, carbohydrate modified diet.  Status post aortic valve replacement Bioprosthetic valve, cardiology following    DVT prophylaxsis heparin  Code Status:      Code Status Orders        Start     Ordered   08/05/15 1953  Full code   Continuous     08/05/15 1952     Family Communication: family updated about patient's clinical progress Disposition Plan: Anticipate discharge to SNF in 2-3 days    Brief narrative: [name] [name] is a 81 y.o. male with past medical history of CKD stage 4-5, dementia, CHF and hypertension. Patient presented with dizziness. Patient seen with his sister-in-law at bedside, mentioned that he have dizziness for the past 2 weeks comes and goes increase with ambulation, upon further questioning this dizziness is likely lightheadedness. Patient also was started to have some cough with clear sputum. Patient called his cardiologist today who asked him to come to the hospital for further evaluation. In the ED was found to have creatinine worsening to 5.5, tachycardiac with lower extremity swelling, CT scan showed no acute events, CXR showed mild loculated effusion in the left side with cardiomegaly. Patient admitted to the hospital for further evaluation.  Consultants:  None  Procedures:  None  Antibiotics: Anti-infectives  Start     Dose/Rate Route Frequency Ordered Stop   08/07/15 2200  azithromycin (ZITHROMAX) tablet 500 mg  Status:  Discontinued      500 mg Oral Daily at bedtime 08/07/15 0918 08/07/15 1315   08/06/15 1600  cefTRIAXone (ROCEPHIN) 1 g in dextrose 5 % 50 mL IVPB  Status:  Discontinued     1 g 100 mL/hr over 30 Minutes Intravenous Every 24 hours 08/05/15 1952 08/07/15 1315   08/05/15 2200  azithromycin (ZITHROMAX) 500 mg in dextrose 5 % 250 mL IVPB  Status:  Discontinued     500 mg 250 mL/hr over 60 Minutes Intravenous Every 24 hours 08/05/15 1952 08/07/15 0918   08/05/15 1545  cefTRIAXone (ROCEPHIN) 1 g in dextrose 5 % 50 mL IVPB     1 g 100 mL/hr over 30 Minutes Intravenous  Once 08/05/15 1535 08/05/15 1751         HPI/Subjective:   Pleasantly confused, unaware of his surroundings   Objective: Filed Vitals:   08/08/15 1811 08/08/15 2142 08/09/15 0442 08/09/15 0452  BP: 146/93 146/103 164/104 139/64  Pulse: 109 113 110 73  Temp: 97.4 F (36.3 C) 97.3 F (36.3 C) 98.2 F (36.8 C) 98.3 F (36.8 C)  TempSrc: Oral Oral Oral Oral  Resp: 18 18 18 16   Height:      Weight:  66.9 kg (147 lb 7.8 oz)    SpO2: 98% 98% 99% 90%    Intake/Output Summary (Last 24 hours) at 08/09/15 0839 Last data filed at 08/09/15 [id number]  Gross per 24 hour  Intake 625.59 ml  Output      3 ml  Net 622.59 ml    Exam:  General: No acute respiratory distress Lungs: Clear to auscultation bilaterally without wheezes or crackles Cardiovascular: Regular rate and rhythm without murmur gallop or rub normal S1 and S2 Abdomen: Nontender, nondistended, soft, bowel sounds positive, no rebound, no ascites, no appreciable mass Extremities: No significant cyanosis, clubbing, or edema bilateral lower extremities     Data Review   Micro Results No results found for this or any previous visit (from the past 240 hour(s)).  Radiology Reports Dg Chest 1 View  08/06/2015  CLINICAL DATA:  Dyspnea. EXAM: CHEST 1 VIEW COMPARISON:  CT of the chest 08/05/2015 FINDINGS: Postsurgical changes from prior aortic valve replacement and probable CABG  are stable. Cardiomediastinal silhouette is stably enlarged. Mediastinal contours appear intact. There is no evidence of focal airspace consolidation or pneumothorax. There is persistent left pleural thickening versus effusion. Osseous structures are without acute abnormality. Extensive osteoarthritic changes of bilateral glenohumeral joints are seen. Soft tissues are grossly normal. IMPRESSION: Stable cardiomegaly. Stable left pleural thickening versus effusion. Electronically Signed   By: Ted Mcalpineobrinka  Dimitrova M.D.   On: 08/06/2015 17:38   Dg Chest 2 View  08/05/2015  CLINICAL DATA:  Three-week history of weakness and dizziness. Cough. EXAM: CHEST  2 VIEW COMPARISON:  Mar 01, 2013 FINDINGS: There is mild loculated effusion at the left base. There is no edema or consolidation. Heart is mildly enlarged with pulmonary vascularity within normal limits. No adenopathy. Patient is status post aortic valve replacement and coronary artery bypass grafting. No adenopathy. There is degenerative change in the thoracic spine. IMPRESSION: Mild loculated effusion left base. No edema or consolidation. Stable cardiomegaly. Electronically Signed   By: Bretta BangWilliam  Woodruff III M.D.   On: 08/05/2015 15:05   Ct Head Wo Contrast  08/05/2015  CLINICAL DATA:  Intermittent dizziness for 3 weeks,  confusion. Decreased appetite, weight loss. Headache. EXAM: CT HEAD WITHOUT CONTRAST TECHNIQUE: Contiguous axial images were obtained from the base of the skull through the vertex without intravenous contrast. COMPARISON:  None. FINDINGS: Old bilateral basal ganglia lacune or infarcts. Low-density throughout the deep white matter compatible with chronic small vessel disease. No acute infarction. No hemorrhage or hydrocephalus. No mass lesion or midline shift. No acute calvarial abnormality. Visualized paranasal sinuses and mastoids clear. Orbital soft tissues unremarkable. IMPRESSION: Old bilateral basal ganglia lacunar infarcts. No acute  intracranial abnormality. Atrophy, chronic microvascular disease. Electronically Signed   By: Charlett Nose M.D.   On: 08/05/2015 16:59   Ct Chest Wo Contrast  08/05/2015  CLINICAL DATA:  81 year old male with cough, chills and possible left pleural effusion on recent chest radiograph EXAM: CT CHEST WITHOUT CONTRAST TECHNIQUE: Multidetector CT imaging of the chest was performed following the standard protocol without IV contrast. COMPARISON:  08/05/2015 and prior radiographs.  09/04/2006 CT FINDINGS: Mediastinum/Nodes: Cardiomegaly and aortic valve replacement again noted. Heavy coronary artery calcifications are identified. An ascending thoracic aortic aneurysm measuring 5 cm is unchanged. There is no evidence of pericardial effusion or enlarged lymph nodes. Lungs/Pleura: Unchanged left pleural thickening accounts for the recent radiographic abnormality. There is no evidence of pleural effusion. Mild bibasilar scarring is present. Moderate centrilobular emphysema is identified. There is no evidence of airspace disease, suspicious nodule, consolidation or mass. There is no evidence of pneumothorax. Upper abdomen: No acute abnormalities. Musculoskeletal: No acute or suspicious abnormalities. Mild degenerative disc disease in the lower thoracic spine noted. IMPRESSION: No evidence of acute abnormality. Chronic left pleural thickening accounts for abnormality identified on recent chest radiograph. No pleural effusion. Unchanged 5 cm ascending thoracic aortic aneurysm, cardiomegaly and coronary artery disease. Emphysema. Electronically Signed   By: Harmon Pier M.D.   On: 08/05/2015 20:54   Mr Brain Wo Contrast  08/06/2015  CLINICAL DATA:  81 year old hypertensive male with dementia and chronic kidney disease presenting with dizziness. Subsequent encounter. EXAM: MRI HEAD WITHOUT CONTRAST TECHNIQUE: Multiplanar, multiecho pulse sequences of the brain and surrounding structures were obtained without intravenous  contrast. COMPARISON:  08/05/2015 head CT. FINDINGS: No acute infarct. Remote bilateral centrum semiovale and basal ganglia infarcts. Remote left cerebellar infarct. Small vessel disease type changes. Blood breakdown products posterior left corona radiata and left occipital lobe probably related to prior episodes of hemorrhagic ischemia. Moderate global atrophy without hydrocephalus. No intracranial mass lesion noted on this unenhanced exam. Ectatic patent major intracranial arterial structures. Cervical medullary junction, pituitary region, pineal region and orbital structures unremarkable. IMPRESSION: No acute infarct. Remote bilateral centrum semiovale and basal ganglia infarcts. Remote left cerebellar infarct. Small vessel disease type changes. Moderate global atrophy without hydrocephalus. Electronically Signed   By: Lacy Duverney M.D.   On: 08/06/2015 21:52   Nm Pulmonary Perf And Vent  08/07/2015  CLINICAL DATA:  Shortness breath.  Dizziness for 2 weeks. EXAM: NUCLEAR MEDICINE VENTILATION - PERFUSION LUNG SCAN TECHNIQUE: Ventilation images were obtained in multiple projections using inhaled aerosol Tc-58m DTPA. Perfusion images were obtained in multiple projections after intravenous injection of Tc-74m MAA. RADIOPHARMACEUTICALS:  40 Technetium-33m DTPA aerosol inhalation and 6.0 Technetium-73m MAA IV COMPARISON:  One-view chest x-ray from the same day. FINDINGS: Ventilation: There is absent undulation in the left lower lobe. There is also segmental defect in the superior segment of the left upper lobe. The right lung uptake is normal. Perfusion: Absent per fusion is matched in the left lower lobe. A matched defect  is present in the superior segment of the left upper lobe. The right lung perfuses normally. There is some clumping of radiopharmaceutical at the hilum. IMPRESSION: 1. Matched left lower lobe and left upper lobe defects. This represents a very low probability (0 - 9%) of pulmonary embolus using  Pioped II criteria. Electronically Signed   By: Marin Roberts M.D.   On: 08/07/2015 10:22     CBC  Recent Labs Lab 08/05/15 1610 08/06/15 0650 08/07/15 0555 08/08/15 0547 08/09/15 0623  WBC 7.3 6.9 6.9 8.9 7.1  HGB 13.8 12.0* 12.5* 13.1 13.1  HCT 41.2 36.6* 37.2* 39.9 40.2  PLT 130* 123* 124* 172 PENDING  MCV 91.8 92.9 91.2 93.0 93.3  MCH 30.7 30.5 30.6 30.5 30.4  MCHC 33.5 32.8 33.6 32.8 32.6  RDW 17.9* 17.9* 17.8* 17.9* 17.5*    Chemistries   Recent Labs Lab 08/05/15 1610 08/06/15 0650 08/07/15 0555 08/08/15 0547  NA 140 144 136 137  K 4.9 4.6 4.4 4.9  CL 110 111 108 107  CO2 22 19* 16* 17*  GLUCOSE 125* 95 115* 88  BUN 54* 61* 66* 69*  CREATININE 5.55* 5.30* 5.64* 5.80*  CALCIUM 9.0 8.8* 8.2* 8.2*  AST  --   --  17  --   ALT  --   --  13*  --   ALKPHOS  --   --  40  --   BILITOT  --   --  0.8  --    ------------------------------------------------------------------------------------------------------------------ estimated creatinine clearance is 9.6 mL/min (by C-G formula based on Cr of 5.8). ------------------------------------------------------------------------------------------------------------------ No results for input(s): HGBA1C in the last 72 hours. ------------------------------------------------------------------------------------------------------------------ No results for input(s): CHOL, HDL, LDLCALC, TRIG, CHOLHDL, LDLDIRECT in the last 72 hours. ------------------------------------------------------------------------------------------------------------------  Recent Labs  08/07/15 1605  TSH 0.918   ------------------------------------------------------------------------------------------------------------------ No results for input(s): VITAMINB12, FOLATE, FERRITIN, TIBC, IRON, RETICCTPCT in the last 72 hours.  Coagulation profile  Recent Labs Lab 08/05/15 2110 08/08/15 0655 08/09/15 0623  INR 1.18 1.25 1.22    No results  for input(s): DDIMER in the last 72 hours.  Cardiac Enzymes  Recent Labs Lab 08/05/15 2110 08/06/15 1326 08/06/15 1829  TROPONINI 0.33* 0.18* 0.21*   ------------------------------------------------------------------------------------------------------------------ Invalid input(s): POCBNP   CBG:  Recent Labs Lab 08/05/15 1445  GLUCAP 162*       Studies: Nm Pulmonary Perf And Vent  08/07/2015  CLINICAL DATA:  Shortness breath.  Dizziness for 2 weeks. EXAM: NUCLEAR MEDICINE VENTILATION - PERFUSION LUNG SCAN TECHNIQUE: Ventilation images were obtained in multiple projections using inhaled aerosol Tc-49m DTPA. Perfusion images were obtained in multiple projections after intravenous injection of Tc-64m MAA. RADIOPHARMACEUTICALS:  40 Technetium-9m DTPA aerosol inhalation and 6.0 Technetium-38m MAA IV COMPARISON:  One-view chest x-ray from the same day. FINDINGS: Ventilation: There is absent undulation in the left lower lobe. There is also segmental defect in the superior segment of the left upper lobe. The right lung uptake is normal. Perfusion: Absent per fusion is matched in the left lower lobe. A matched defect is present in the superior segment of the left upper lobe. The right lung perfuses normally. There is some clumping of radiopharmaceutical at the hilum. IMPRESSION: 1. Matched left lower lobe and left upper lobe defects. This represents a very low probability (0 - 9%) of pulmonary embolus using Pioped II criteria. Electronically Signed   By: Marin Roberts M.D.   On: 08/07/2015 10:22      Lab Results  Component Value Date   HGBA1C  5.5 08/05/2015   HGBA1C 5.9* 03/30/2012   Lab Results  Component Value Date   CREATININE 5.80* 08/08/2015       Scheduled Meds: . amiodarone  200 mg Oral BID  . calcitRIOL  0.25 mcg Oral Daily  . carvedilol  6.25 mg Oral BID WC  . donepezil  5 mg Oral QHS  . feeding supplement (NEPRO CARB STEADY)  237 mL Oral Q24H  . sodium  chloride  3 mL Intravenous Q12H  . Warfarin - Pharmacist Dosing Inpatient   Does not apply q1800   Continuous Infusions: . sodium chloride 75 mL/hr at 08/07/15 2355  . heparin 650 Units/hr (08/08/15 1608)    Principal Problem:   Dizziness Active Problems:   Chronic kidney disease (CKD), stage IV (severe) (HCC)   Pneumonia   Diabetes mellitus type 2, diet-controlled (HCC)   CAD (coronary artery disease)   Status post aortic valve replacement   Protein-calorie malnutrition, severe   Dyspnea    Time spent: 45 minutes   [facility]  [facility] Pager [phone]. If 7PM-7AM, please contact night-coverage at [email or web address], password [facility] 08/09/2015, 8:39 AM  LOS: 4 days

## 2015-08-09 NOTE — Progress Notes (Signed)
ANTICOAGULATION CONSULT NOTE   Pharmacy Consult for Heparin Indication: atrial fibrillation  Allergies  Allergen Reactions  . Clams [Shellfish Allergy]     Gout     Patient Measurements: Height: 5\' 10"  (177.8 cm) Weight: 147 lb 7.8 oz (66.9 kg) IBW/kg (Calculated) : 73   Vital Signs: Temp: 98.2 F (36.8 C) (10/16 1600) Temp Source: Oral (10/16 1600) BP: 150/97 mmHg (10/16 1643) Pulse Rate: 118 (10/16 1643)  Labs:  Recent Labs  08/07/15 0555  08/08/15 0547 08/08/15 0655 08/08/15 2010 08/09/15 0623 08/09/15 0800 08/09/15 1939  HGB 12.5*  --  13.1  --   --  13.1  --   --   HCT 37.2*  --  39.9  --   --  40.2  --   --   PLT 124*  --  172  --   --  126*  --   --   LABPROT  --   --   --  15.8*  --  15.5*  --   --   INR  --   --   --  1.25  --  1.22  --   --   HEPARINUNFRC  --   < >  --  0.75* 0.47 0.21*  --  0.35  CREATININE 5.64*  --  5.80*  --   --   --  5.93*  --   < > = values in this interval not displayed.  Estimated Creatinine Clearance: 9.4 mL/min (by C-G formula based on Cr of 5.93).   Assessment: 81 yo M with dementia and ARF.  Pharmacy consulted to dose heparin and coumadin for new onset afib.  PM HL therapeutic.  Goal of Therapy:  INR 2-3 Heparin level 0.3-0.7 units/ml Monitor platelets by anticoagulation protocol: Yes   Plan:  Continue heparin gtt at 750 units/hr Monitor daily HL / INR, CBC, s/s of bleed  Thank you Okey RegalLisa Katesha Eichel, PharmD [phone] 08/09/2015 8:12 PM

## 2015-08-09 NOTE — Progress Notes (Signed)
Patient ID: [name] [name], male   DOB: 1933-09-25, 81 y.o.   MRN: [id number] S:Pt more confused this am and was tearful and apologizing for unclear reason O:BP 139/64 mmHg  Pulse 73  Temp(Src) 98.3 F (36.8 C) (Oral)  Resp 16  Ht  (1.778 m)  Wt 66.9 kg (147 lb 7.8 oz)  BMI 21.16 kg/m2  SpO2 90%  Intake/Output Summary (Last 24 hours) at 08/09/15 0902 Last data filed at 08/09/15 4098  Gross per 24 hour  Intake 625.59 ml  Output      3 ml  Net 622.59 ml   Intake/Output: I/O last 3 completed shifts: In: 2178.1 [P.O.:600; I.V.:1578.1] Out: 2 [Stool:2]  Intake/Output this shift:  Total I/O In: -  Out: 1 [Stool:1] Weight change: -0.096 kg (-3.4 oz) Gen:WD elderly AAM in mild emotional distress CVS:IRR IRR no rub Resp:scattered rhonchi bilaterally JXB:JYNWGN Ext: tr edema   Recent Labs Lab 08/05/15 1610 08/06/15 0650 08/07/15 0555 08/08/15 0547 08/09/15 0800  NA 140 144 136 137 136  K 4.9 4.6 4.4 4.9 5.5*  CL 110 111 108 107 109  CO2 22 19* 16* 17* 16*  GLUCOSE 125* 95 115* 88 106*  BUN 54* 61* 66* 69* 76*  CREATININE 5.55* 5.30* 5.64* 5.80* 5.93*  ALBUMIN  --   --  3.3* 3.2* 3.1*  CALCIUM 9.0 8.8* 8.2* 8.2* 8.3*  PHOS  --   --   --  5.6*  --   AST  --   --  17  --  27  ALT  --   --  13*  --  11*   Liver Function Tests:  Recent Labs Lab 08/07/15 0555 08/08/15 0547 08/09/15 0800  AST 17  --  27  ALT 13*  --  11*  ALKPHOS 40  --  39  BILITOT 0.8  --  0.8  PROT 6.3*  --  6.0*  ALBUMIN 3.3* 3.2* 3.1*   No results for input(s): LIPASE, AMYLASE in the last 168 hours. No results for input(s): AMMONIA in the last 168 hours. CBC:  Recent Labs Lab 08/05/15 1610 08/06/15 0650 08/07/15 0555 08/08/15 0547 08/09/15 0623  WBC 7.3 6.9 6.9 8.9 7.1  HGB 13.8 12.0* 12.5* 13.1 13.1  HCT 41.2 36.6* 37.2* 39.9 40.2  MCV 91.8 92.9 91.2 93.0 93.3  PLT 130* 123* 124* 172 PENDING   Cardiac Enzymes:  Recent Labs Lab 08/05/15 2110 08/06/15 1326  08/06/15 1829  TROPONINI 0.33* 0.18* 0.21*   CBG:  Recent Labs Lab 08/05/15 1445  GLUCAP 162*    Iron Studies: No results for input(s): IRON, TIBC, TRANSFERRIN, FERRITIN in the last 72 hours. Studies/Results: Nm Pulmonary Perf And Vent  08/07/2015  CLINICAL DATA:  Shortness breath.  Dizziness for 2 weeks. EXAM: NUCLEAR MEDICINE VENTILATION - PERFUSION LUNG SCAN TECHNIQUE: Ventilation images were obtained in multiple projections using inhaled aerosol Tc-83m DTPA. Perfusion images were obtained in multiple projections after intravenous injection of Tc-56m MAA. RADIOPHARMACEUTICALS:  40 Technetium-15m DTPA aerosol inhalation and 6.0 Technetium-10m MAA IV COMPARISON:  One-view chest x-ray from the same day. FINDINGS: Ventilation: There is absent undulation in the left lower lobe. There is also segmental defect in the superior segment of the left upper lobe. The right lung uptake is normal. Perfusion: Absent per fusion is matched in the left lower lobe. A matched defect is present in the superior segment of the left upper lobe. The right lung perfuses normally. There is some clumping of radiopharmaceutical at the hilum.  IMPRESSION: 1. Matched left lower lobe and left upper lobe defects. This represents a very low probability (0 - 9%) of pulmonary embolus using Pioped II criteria. Electronically Signed   By: [name] Robertshristopher  [name] M.D.   On: 08/07/2015 10:22   . amiodarone  200 mg Oral BID  . calcitRIOL  0.25 mcg Oral Daily  . carvedilol  6.25 mg Oral BID WC  . donepezil  5 mg Oral QHS  . feeding supplement (NEPRO CARB STEADY)  237 mL Oral Q24H  . sodium chloride  3 mL Intravenous Q12H  . Warfarin - Pharmacist Dosing Inpatient   Does not apply q1800    BMET    Component Value Date/Time   NA 136 08/09/2015 0800   K 5.5* 08/09/2015 0800   CL 109 08/09/2015 0800   CO2 16* 08/09/2015 0800   GLUCOSE 106* 08/09/2015 0800   BUN 76* 08/09/2015 0800   CREATININE 5.93* 08/09/2015 0800   CALCIUM  8.3* 08/09/2015 0800   GFRNONAA 8* 08/09/2015 0800   GFRAA 9* 08/09/2015 0800   CBC    Component Value Date/Time   WBC 7.1 08/09/2015 0623   RBC 4.31 08/09/2015 0623   HGB 13.1 08/09/2015 0623   HCT 40.2 08/09/2015 0623   PLT PENDING 08/09/2015 0623   MCV 93.3 08/09/2015 0623   MCH 30.4 08/09/2015 0623   MCHC 32.6 08/09/2015 0623   RDW 17.5* 08/09/2015 0623   LYMPHSABS 1.4 03/02/2013 0001   MONOABS 0.6 03/02/2013 0001   EOSABS 0.1 03/02/2013 0001   BASOSABS 0.0 03/02/2013 0001     Assessment/Plan: 1. AKI/CKD stage 4-5- in setting of poor po intake and new onset Atrial fibrillation. I had a lengthy discussion with Mr. [name] [name]'s sister-in-law. We are both in agreement that [name] [name] has not been interested in renal replacement therapy of any kind and that if his kidney function were to continue to deteriorate, she would be amenable to hospice/palliative care evaluation.  1. For now would give one dose of IV lasix due to hyperkalemia and follow. 2. Consult palliative care to help set goals/limits of care as he is still a full code (he would require hospice services if renal function continues to deteriorate) 2. New onset Atrial Fib/A flutter- increase coreg per cardiology 1. Would not recommend anticoagulation given his worsening demential and increased fall risk as risks outweigh benefits. 3. Hyperkalemia- will treat with kayexalate, bicarb, and restart lasix and follow UOP and K levels. 4. HTN- stable 5. Anemia of chronic disease- stable not on ESA 6. CHF- possibly related to new A fib. Await cardiology input. 7. AMS- pulled out his IV's earlier. Possibly related to dementia vs. delerium from acute illness. Also need to r/o infection. 8. Protein malnutrition- renal diet 9. Disposition- unclear if he is going to be able to remain independent. PT/OT eval.  Await Palliative care input.  [name] [name] A

## 2015-08-10 LAB — PROTEIN ELECTROPHORESIS, SERUM
A/G Ratio: 1.4 (ref 0.7–1.7)
ALPHA-1-GLOBULIN: 0.1 g/dL (ref 0.0–0.4)
Albumin ELP: 3.3 g/dL (ref 2.9–4.4)
Alpha-2-Globulin: 0.4 g/dL (ref 0.4–1.0)
Beta Globulin: 0.8 g/dL (ref 0.7–1.3)
GAMMA GLOBULIN: 0.9 g/dL (ref 0.4–1.8)
GLOBULIN, TOTAL: 2.3 g/dL (ref 2.2–3.9)
TOTAL PROTEIN ELP: 5.6 g/dL — AB (ref 6.0–8.5)

## 2015-08-10 LAB — UIFE/LIGHT CHAINS/TP QN, 24-HR UR
% BETA, URINE: 8.3 %
ALBUMIN, U: 76.2 %
ALPHA 1 URINE: 0.5 %
ALPHA 2 UR: 2.2 %
FREE LAMBDA LT CHAINS, UR: 25.1 mg/L — AB (ref 0.24–6.66)
Free Kappa/Lambda Ratio: 6.1 (ref 2.04–10.37)
Free Lt Chn Excr Rate: 153 mg/L — ABNORMAL HIGH (ref 1.35–24.19)
GAMMA GLOBULIN URINE: 12.7 %
TOTAL PROTEIN, URINE-UPE24: 40.8 mg/dL

## 2015-08-10 LAB — RENAL FUNCTION PANEL
ANION GAP: 12 (ref 5–15)
Albumin: 3.1 g/dL — ABNORMAL LOW (ref 3.5–5.0)
BUN: 75 mg/dL — ABNORMAL HIGH (ref 6–20)
CHLORIDE: 109 mmol/L (ref 101–111)
CO2: 20 mmol/L — ABNORMAL LOW (ref 22–32)
Calcium: 8.3 mg/dL — ABNORMAL LOW (ref 8.9–10.3)
Creatinine, Ser: 6.04 mg/dL — ABNORMAL HIGH (ref 0.61–1.24)
GFR, EST AFRICAN AMERICAN: 9 mL/min — AB (ref >60)
GFR, EST NON AFRICAN AMERICAN: 8 mL/min — AB (ref >60)
Glucose, Bld: 91 mg/dL (ref 65–99)
POTASSIUM: 3.7 mmol/L (ref 3.5–5.1)
Phosphorus: 6.2 mg/dL — ABNORMAL HIGH (ref 2.5–4.6)
Sodium: 141 mmol/L (ref 135–145)

## 2015-08-10 LAB — CBC
HCT: 38.2 % — ABNORMAL LOW (ref 39.0–52.0)
HEMOGLOBIN: 12.5 g/dL — AB (ref 13.0–17.0)
MCH: 29.8 pg (ref 26.0–34.0)
MCHC: 32.7 g/dL (ref 30.0–36.0)
MCV: 91.2 fL (ref 78.0–100.0)
Platelets: 165 10*3/uL (ref 150–400)
RBC: 4.19 MIL/uL — AB (ref 4.22–5.81)
RDW: 17.5 % — ABNORMAL HIGH (ref 11.5–15.5)
WBC: 7.8 10*3/uL (ref 4.0–10.5)

## 2015-08-10 LAB — HEPARIN LEVEL (UNFRACTIONATED): Heparin Unfractionated: 0.45 IU/mL (ref 0.30–0.70)

## 2015-08-10 LAB — PROTIME-INR
INR: 1.23 (ref 0.00–1.49)
PROTHROMBIN TIME: 15.7 s — AB (ref 11.6–15.2)

## 2015-08-10 LAB — MAGNESIUM: Magnesium: 1.9 mg/dL (ref 1.7–2.4)

## 2015-08-10 MED ORDER — WARFARIN SODIUM 7.5 MG PO TABS
7.5000 mg | ORAL_TABLET | Freq: Once | ORAL | Status: AC
  Administered 2015-08-10: 7.5 mg via ORAL
  Filled 2015-08-10: qty 1

## 2015-08-10 MED ORDER — CARVEDILOL 6.25 MG PO TABS: 6.2500 mg | ORAL_TABLET | Freq: Two times a day (BID) | ORAL | Status: DC

## 2015-08-10 MED ORDER — AMIODARONE HCL 400 MG PO TABS: 400.0000 mg | ORAL_TABLET | Freq: Two times a day (BID) | ORAL | Status: DC

## 2015-08-10 MED ORDER — NEPRO/CARBSTEADY PO LIQD: 237.0000 mL | ORAL | Status: AC

## 2015-08-10 MED ORDER — FUROSEMIDE 40 MG PO TABS
40.0000 mg | ORAL_TABLET | Freq: Two times a day (BID) | ORAL | Status: DC
  Administered 2015-08-10 – 2015-08-11 (×2): 40 mg via ORAL
  Filled 2015-08-10 (×2): qty 1

## 2015-08-10 NOTE — Clinical Social Work Note (Signed)
CSW continuing to monitor patient's progress and he is not yet medically stable for discharge per MD. CSW talked with patient's sister-in-law Marta AntuFaye Thorne by phone regarding discharge today, however CSW later found out and talked again with Ms. Jacinto Reaphorne that patient not medically ready for discharge. CSW will continue to follow.   Genelle BalVanessa Hung Rhinesmith, MSW, LCSW Licensed Clinical Social Worker Clinical Social Work Department [facility] [phone]

## 2015-08-10 NOTE — Progress Notes (Signed)
ANTICOAGULATION CONSULT NOTE   Pharmacy Consult for Heparin + warfarin Indication: atrial fibrillation  Allergies  Allergen Reactions  . Clams [Shellfish Allergy]     Gout     Patient Measurements: Height: 5\' 10"  (177.8 cm) Weight: 147 lb 7.8 oz (66.9 kg) IBW/kg (Calculated) : 73   Vital Signs: Temp: 97.7 F (36.5 C) (10/17 1055) Temp Source: Oral (10/17 1055) BP: 162/100 mmHg (10/17 1055) Pulse Rate: 63 (10/17 1055)  Labs:  Recent Labs  08/08/15 0547 08/08/15 0655  08/09/15 0623 08/09/15 0800 08/09/15 1939 08/10/15 0500  HGB 13.1  --   --  13.1  --   --  12.5*  HCT 39.9  --   --  40.2  --   --  38.2*  PLT 172  --   --  126*  --   --  165  LABPROT  --  15.8*  --  15.5*  --   --  15.7*  INR  --  1.25  --  1.22  --   --  1.23  HEPARINUNFRC  --  0.75*  < > 0.21*  --  0.35 0.45  CREATININE 5.80*  --   --   --  5.93*  --  6.04*  < > = values in this interval not displayed.  Estimated Creatinine Clearance: 9.2 mL/min (by C-G formula based on Cr of 6.04).   Assessment: 81 yo M with dementia and ARF with new onset AFib. He was started on IV heparin and warfarin for anticoagulation. Dr. Sharyn LullHarwani wishes to continue bridge as he is not rate controlled, and not a good candidate for Lovenox d/t ARF. INR 1.23, HL 0.45units/mL this morning.   Hgb 12.5, ptls 165- no bleeding noted.  Goal of Therapy:  INR 2-3 Heparin level 0.3-0.7 units/ml Monitor platelets by anticoagulation protocol: Yes   Plan:  -Continue heparin gtt at 750 units/hr -warfarin 7.5mg  po x1 tonight  -Monitor daily HL / INR, CBC, s/s of bleed  Kathan Kirker D. Kemari Narez, PharmD, BCPS Clinical Pharmacist Pager: [phone] 08/10/2015 11:51 AM

## 2015-08-10 NOTE — Progress Notes (Signed)
  Assessment/Plan: 1. AKI/CKD stage 4-5- in setting of poor po intake and new onset Atrial fibrillation.Restart furosemide at lower dose.   2. New onset Atrial Fib/A flutter-  per cardiology(Amio,coreg,coumadin) 3. Hyperkalemia- resolved 4. HTN- stable 5. Anemia of chronic disease- stable not on ESA 6. CHF- possibly related to new A fib. Await cardiology input. 7. Protein malnutrition- renal diet 8. FTT/Dementia...not sure how much of this is uremia   Subjective: Interval History: OK today  Objective: Vital signs in last 24 hours: Temp:  [97.7 F (36.5 C)-98.3 F (36.8 C)] 97.7 F (36.5 C) (10/17 1055) Pulse Rate:  [58-118] 63 (10/17 1055) Resp:  [17-20] 20 (10/17 1055) BP: (144-177)/(80-140) 162/100 mmHg (10/17 1055) SpO2:  [98 %-99 %] 99 % (10/17 1055) Weight change:   Intake/Output from previous day: 10/16 0701 - 10/17 0700 In: 1648.9 [P.O.:240; I.V.:1408.9] Out: 334 [Urine:325; Stool:9] Intake/Output this shift: Total I/O In: 240 [P.O.:240] Out: -   General appearance: alert and cooperative Resp: clear to auscultation bilaterally Chest wall: no tenderness Cardio: irregularly irregular rhythm Extremities:tr edema  Lab Results:  Recent Labs  08/09/15 0623 08/10/15 0500  WBC 7.1 7.8  HGB 13.1 12.5*  HCT 40.2 38.2*  PLT 126* 165   BMET:  Recent Labs  08/09/15 0800 08/10/15 0500  NA 136 141  K 5.5* 3.7  CL 109 109  CO2 16* 20*  GLUCOSE 106* 91  BUN 76* 75*  CREATININE 5.93* 6.04*  CALCIUM 8.3* 8.3*   No results for input(s): PTH in the last 72 hours. Iron Studies: No results for input(s): IRON, TIBC, TRANSFERRIN, FERRITIN in the last 72 hours. Studies/Results: No results found.  Scheduled: . amiodarone  400 mg Oral BID  . calcitRIOL  0.25 mcg Oral Daily  . carvedilol  6.25 mg Oral BID WC  . donepezil  5 mg Oral QHS  . feeding supplement (NEPRO CARB STEADY)  237 mL Oral Q24H  . sodium chloride  3 mL Intravenous Q12H  . Warfarin -  Pharmacist Dosing Inpatient   Does not apply q1800    LOS: 5 days   Madisun Hargrove C 08/10/2015,11:27 AM

## 2015-08-10 NOTE — Progress Notes (Signed)
Occupational Therapy Treatment Patient Details Name: [name] GammaClarence E Riggle MRN: [id number] DOB: 04/23/1933 Today's Date: 08/10/2015    History of present illness 81 yo male admitted from home alone with dizziness. pt in ED found to have creatinine worsening to 5.5 and tachycardia.  PMH: dementia, CKD IV-V, CHF , HTN   OT comments  Focus of session included toilet transfer and grooming activities required for ADL independence. On initial attempt to perform toilet transfer, pt exhibited bladder incontinence when standing from chair. Pt able to stand at sink to perform 2 grooming activities with only min guard assistance for safety. Pt still displaying cognitive deficits, however pt is making good progress towards OT goals.  Follow Up Recommendations  SNF    Equipment Recommendations  3 in 1 bedside comode;Other (comment)    Recommendations for Other Services      Precautions / Restrictions Precautions Precautions: Fall Restrictions Weight Bearing Restrictions: No       Mobility Bed Mobility               General bed mobility comments: Pt in chair upon arrival  Transfers Overall transfer level: Needs assistance Equipment used: Rolling walker (2 wheeled) Transfers: Sit to/from Stand Sit to Stand: Min guard         General transfer comment: min guard for safety and verbal cues for hand placement    Balance Overall balance assessment: Needs assistance Sitting-balance support: No upper extremity supported;Feet supported Sitting balance-Leahy Scale: Fair     Standing balance support: Single extremity supported;During functional activity Standing balance-Leahy Scale: Fair Standing balance comment: Pt able to stand at sink to perform grooming taks with a single UE supported                   ADL Overall ADL's : Needs assistance/impaired     Grooming: Wash/dry face;Oral care;Min guard;Standing                               Functional mobility  during ADLs: Min guard;Rolling walker        Vision                     Perception     Praxis      Cognition   Behavior During Therapy: WFL for tasks assessed/performed Overall Cognitive Status: Impaired/Different from baseline     Current Attention Level: Sustained                 Extremity/Trunk Assessment               Exercises     Shoulder Instructions       General Comments      Pertinent Vitals/ Pain       Pain Assessment: No/denies pain  Home Living                                          Prior Functioning/Environment              Frequency Min 2X/week     Progress Toward Goals  OT Goals(current goals can now be found in the care plan section)  Progress towards OT goals: Progressing toward goals  Acute Rehab OT Goals Patient Stated Goal: none stated OT Goal Formulation: With patient/family Time For Goal Achievement: 08/21/15 Potential to Achieve Goals:  Good ADL Goals Pt Will Perform Grooming: with min guard assist;standing Pt Will Perform Upper Body Bathing: with min guard assist;sitting Pt Will Perform Lower Body Bathing: with min guard assist;sitting/lateral leans Pt Will Transfer to Toilet: with min guard assist;ambulating;regular height toilet  Plan Discharge plan remains appropriate    Co-evaluation                 End of Session Equipment Utilized During Treatment: Gait belt;Rolling walker   Activity Tolerance Patient tolerated treatment well   Patient Left in chair;with call bell/phone within reach;with chair alarm set   Nurse Communication          Time: 0865-7846 OT Time Calculation (min): 29 min  Charges: OT General Charges $OT Visit: 1 Procedure OT Treatments $Self Care/Home Management : 23-37 mins  Smiley Houseman 08/10/2015, 1:38 PM

## 2015-08-10 NOTE — Progress Notes (Signed)
[facility] PROGRESS NOTE  [name] [name] ZOX:[id number] DOB: [birth year] DOA: 08/05/2015 PCP: [name] [name]  Length of stay: 5   Assessment/Plan: Principal Problem:   Dizziness Active Problems:   Chronic kidney disease (CKD), stage IV (severe) (HCC)   Pneumonia   Diabetes mellitus type 2, diet-controlled (HCC)   CAD (coronary artery disease)   Status post aortic valve replacement   Protein-calorie malnutrition, severe   Dyspnea   Palliative care encounter     Acute on chronic kidney disease stage V Patient creatinine baseline was 4.5 about 2 years ago in May 2014. Patient followed by [name] [name] Creatinine around 6.0 now, nephrology following, currently on IV sodium bicarbonate Because of dementia  Nephrology does  not recommend any dialysis at this time Defer fluid management to nephrology   Probable community-acquired pneumonia Diagnosed with pneumonia in the ED, however CT scan does not show any evidence of pneumonia, no pleural effusion, no vascular congestion Concern for aspiration, however patient is not hypoxic and appears comfortable, therefore discontinued antibiotics, patient remained stable and afebrile Speech therapy consultation Dysphagia 3 (Mech soft);Nectar   continue bronchodilators and mucolytic  Chronic systolic/diastolic heart failure, does not appear to be in acute exacerbation   EF 45-50% on repeat echo, Lasix discontinued given low BP Discontinued bidil due to hypotension on 10/14  Lightheadedness, could be related to new onset atrial flutter ,Cardiology [name] [name] has been consulted Tachycardic into the 120s, EKG shows atrial fibrillation from 10/13, repeat EKG today for comparison, continue to cycle cardiac enzymes, 2-D echo shows EF of 45-50%, continue telemetry PT/OT recommended SNF. MRI of the brain negative for acute CVA but showed old infarcts   New-onset atrial flutter with RVR.History of severe aortic stenosis,  status post bioprosthetic aortic valve replacement chadsvas score of 8 Cardiology, will continue to adjust amiodarone and Coreg VQ scan negative for PE Cardiology recommends anticoagulation with heparin drip/Coumadin, until INR therapeutic Until INR is therapeutic, patient remains inpatient Continue to follow platelet count closely given history of thrombocytopenia INR currently Subtherapeutic at 1.23, essentially has not changed in 3 days   Hypertension controlled continue Coreg and hold  BiDil  Diabetes mellitus type 2 Diet-controlled, hemoglobin A1c  5.5, carbohydrate modified diet.  Status post aortic valve replacement Bioprosthetic valve, cardiology following    DVT prophylaxsis heparin  Code Status:      Code Status Orders        Start     Ordered   08/05/15 1953  Full code   Continuous     08/05/15 1952     Family Communication: family updated about patient's clinical progress Disposition Plan: Anticipate discharge to SNF in 2-3 days    Brief narrative: [name] [name] is a 81 y.o. male with past medical history of CKD stage 4-5, dementia, CHF and hypertension. Patient presented with dizziness. Patient seen with his sister-in-law at bedside, mentioned that he have dizziness for the past 2 weeks comes and goes increase with ambulation, upon further questioning this dizziness is likely lightheadedness. Patient also was started to have some cough with clear sputum. Patient called his cardiologist today who asked him to come to the hospital for further evaluation. In the ED was found to have creatinine worsening to 5.5, tachycardiac with lower extremity swelling, CT scan showed no acute events, CXR showed mild loculated effusion in the left side with cardiomegaly. Patient admitted to the hospital for further evaluation.  Consultants:  None  Procedures:  None  Antibiotics: Anti-infectives    Start     Dose/Rate Route Frequency Ordered Stop   08/07/15 2200   azithromycin (ZITHROMAX) tablet 500 mg  Status:  Discontinued     500 mg Oral Daily at bedtime 08/07/15 0918 08/07/15 1315   08/06/15 1600  cefTRIAXone (ROCEPHIN) 1 g in dextrose 5 % 50 mL IVPB  Status:  Discontinued     1 g 100 mL/hr over 30 Minutes Intravenous Every 24 hours 08/05/15 1952 08/07/15 1315   08/05/15 2200  azithromycin (ZITHROMAX) 500 mg in dextrose 5 % 250 mL IVPB  Status:  Discontinued     500 mg 250 mL/hr over 60 Minutes Intravenous Every 24 hours 08/05/15 1952 08/07/15 0918   08/05/15 1545  cefTRIAXone (ROCEPHIN) 1 g in dextrose 5 % 50 mL IVPB     1 g 100 mL/hr over 30 Minutes Intravenous  Once 08/05/15 1535 08/05/15 1751         HPI/Subjective:  Patient is pleasantly confused, denies any chest pain or shortness of breath, no nausea vomiting  Objective: Filed Vitals:   08/09/15 1643 08/09/15 2155 08/10/15 0425 08/10/15 1055  BP: 150/97 144/95 157/80 162/100  Pulse: 118 97 118 63  Temp:  98.3 F (36.8 C) 98 F (36.7 C) 97.7 F (36.5 C)  TempSrc:  Oral Oral Oral  Resp:  18 18 20   Height:      Weight:      SpO2:  98% 98% 99%    Intake/Output Summary (Last 24 hours) at 08/10/15 1127 Last data filed at 08/10/15 0900  Gross per 24 hour  Intake 1888.9 ml  Output      7 ml  Net 1881.9 ml    Exam:  General: No acute respiratory distress Lungs: Clear to auscultation bilaterally without wheezes or crackles Cardiovascular: Regular rate and rhythm without murmur gallop or rub normal S1 and S2 Abdomen: Nontender, nondistended, soft, bowel sounds positive, no rebound, no ascites, no appreciable mass Extremities: No significant cyanosis, clubbing, or edema bilateral lower extremities     Data Review   Micro Results No results found for this or any previous visit (from the past 240 hour(s)).  Radiology Reports Dg Chest 1 View  08/06/2015  CLINICAL DATA:  Dyspnea. EXAM: CHEST 1 VIEW COMPARISON:  CT of the chest 08/05/2015 FINDINGS: Postsurgical changes  from prior aortic valve replacement and probable CABG are stable. Cardiomediastinal silhouette is stably enlarged. Mediastinal contours appear intact. There is no evidence of focal airspace consolidation or pneumothorax. There is persistent left pleural thickening versus effusion. Osseous structures are without acute abnormality. Extensive osteoarthritic changes of bilateral glenohumeral joints are seen. Soft tissues are grossly normal. IMPRESSION: Stable cardiomegaly. Stable left pleural thickening versus effusion. Electronically Signed   By: Ted Mcalpineobrinka  Dimitrova M.D.   On: 08/06/2015 17:38   Dg Chest 2 View  08/05/2015  CLINICAL DATA:  Three-week history of weakness and dizziness. Cough. EXAM: CHEST  2 VIEW COMPARISON:  Mar 01, 2013 FINDINGS: There is mild loculated effusion at the left base. There is no edema or consolidation. Heart is mildly enlarged with pulmonary vascularity within normal limits. No adenopathy. Patient is status post aortic valve replacement and coronary artery bypass grafting. No adenopathy. There is degenerative change in the thoracic spine. IMPRESSION: Mild loculated effusion left base. No edema or consolidation. Stable cardiomegaly. Electronically Signed   By: Bretta BangWilliam  Woodruff III M.D.   On: 08/05/2015 15:05   Ct Head Wo Contrast  08/05/2015  CLINICAL DATA:  Intermittent  dizziness for 3 weeks, confusion. Decreased appetite, weight loss. Headache. EXAM: CT HEAD WITHOUT CONTRAST TECHNIQUE: Contiguous axial images were obtained from the base of the skull through the vertex without intravenous contrast. COMPARISON:  None. FINDINGS: Old bilateral basal ganglia lacune or infarcts. Low-density throughout the deep white matter compatible with chronic small vessel disease. No acute infarction. No hemorrhage or hydrocephalus. No mass lesion or midline shift. No acute calvarial abnormality. Visualized paranasal sinuses and mastoids clear. Orbital soft tissues unremarkable. IMPRESSION: Old  bilateral basal ganglia lacunar infarcts. No acute intracranial abnormality. Atrophy, chronic microvascular disease. Electronically Signed   By: Charlett Nose M.D.   On: 08/05/2015 16:59   Ct Chest Wo Contrast  08/05/2015  CLINICAL DATA:  81 year old male with cough, chills and possible left pleural effusion on recent chest radiograph EXAM: CT CHEST WITHOUT CONTRAST TECHNIQUE: Multidetector CT imaging of the chest was performed following the standard protocol without IV contrast. COMPARISON:  08/05/2015 and prior radiographs.  09/04/2006 CT FINDINGS: Mediastinum/Nodes: Cardiomegaly and aortic valve replacement again noted. Heavy coronary artery calcifications are identified. An ascending thoracic aortic aneurysm measuring 5 cm is unchanged. There is no evidence of pericardial effusion or enlarged lymph nodes. Lungs/Pleura: Unchanged left pleural thickening accounts for the recent radiographic abnormality. There is no evidence of pleural effusion. Mild bibasilar scarring is present. Moderate centrilobular emphysema is identified. There is no evidence of airspace disease, suspicious nodule, consolidation or mass. There is no evidence of pneumothorax. Upper abdomen: No acute abnormalities. Musculoskeletal: No acute or suspicious abnormalities. Mild degenerative disc disease in the lower thoracic spine noted. IMPRESSION: No evidence of acute abnormality. Chronic left pleural thickening accounts for abnormality identified on recent chest radiograph. No pleural effusion. Unchanged 5 cm ascending thoracic aortic aneurysm, cardiomegaly and coronary artery disease. Emphysema. Electronically Signed   By: Harmon Pier M.D.   On: 08/05/2015 20:54   Mr Brain Wo Contrast  08/06/2015  CLINICAL DATA:  81 year old hypertensive male with dementia and chronic kidney disease presenting with dizziness. Subsequent encounter. EXAM: MRI HEAD WITHOUT CONTRAST TECHNIQUE: Multiplanar, multiecho pulse sequences of the brain and  surrounding structures were obtained without intravenous contrast. COMPARISON:  08/05/2015 head CT. FINDINGS: No acute infarct. Remote bilateral centrum semiovale and basal ganglia infarcts. Remote left cerebellar infarct. Small vessel disease type changes. Blood breakdown products posterior left corona radiata and left occipital lobe probably related to prior episodes of hemorrhagic ischemia. Moderate global atrophy without hydrocephalus. No intracranial mass lesion noted on this unenhanced exam. Ectatic patent major intracranial arterial structures. Cervical medullary junction, pituitary region, pineal region and orbital structures unremarkable. IMPRESSION: No acute infarct. Remote bilateral centrum semiovale and basal ganglia infarcts. Remote left cerebellar infarct. Small vessel disease type changes. Moderate global atrophy without hydrocephalus. Electronically Signed   By: Lacy Duverney M.D.   On: 08/06/2015 21:52   Nm Pulmonary Perf And Vent  08/07/2015  CLINICAL DATA:  Shortness breath.  Dizziness for 2 weeks. EXAM: NUCLEAR MEDICINE VENTILATION - PERFUSION LUNG SCAN TECHNIQUE: Ventilation images were obtained in multiple projections using inhaled aerosol Tc-37m DTPA. Perfusion images were obtained in multiple projections after intravenous injection of Tc-47m MAA. RADIOPHARMACEUTICALS:  40 Technetium-19m DTPA aerosol inhalation and 6.0 Technetium-33m MAA IV COMPARISON:  One-view chest x-ray from the same day. FINDINGS: Ventilation: There is absent undulation in the left lower lobe. There is also segmental defect in the superior segment of the left upper lobe. The right lung uptake is normal. Perfusion: Absent per fusion is matched in the left lower  lobe. A matched defect is present in the superior segment of the left upper lobe. The right lung perfuses normally. There is some clumping of radiopharmaceutical at the hilum. IMPRESSION: 1. Matched left lower lobe and left upper lobe defects. This represents a  very low probability (0 - 9%) of pulmonary embolus using Pioped II criteria. Electronically Signed   By: Marin Roberts M.D.   On: 08/07/2015 10:22     CBC  Recent Labs Lab 08/06/15 0650 08/07/15 0555 08/08/15 0547 08/09/15 0623 08/10/15 0500  WBC 6.9 6.9 8.9 7.1 7.8  HGB 12.0* 12.5* 13.1 13.1 12.5*  HCT 36.6* 37.2* 39.9 40.2 38.2*  PLT 123* 124* 172 126* 165  MCV 92.9 91.2 93.0 93.3 91.2  MCH 30.5 30.6 30.5 30.4 29.8  MCHC 32.8 33.6 32.8 32.6 32.7  RDW 17.9* 17.8* 17.9* 17.5* 17.5*    Chemistries   Recent Labs Lab 08/06/15 0650 08/07/15 0555 08/08/15 0547 08/09/15 0800 08/10/15 0500  NA 144 136 137 136 141  K 4.6 4.4 4.9 5.5* 3.7  CL 111 108 107 109 109  CO2 19* 16* 17* 16* 20*  GLUCOSE 95 115* 88 106* 91  BUN 61* 66* 69* 76* 75*  CREATININE 5.30* 5.64* 5.80* 5.93* 6.04*  CALCIUM 8.8* 8.2* 8.2* 8.3* 8.3*  MG  --   --   --   --  1.9  AST  --  17  --  27  --   ALT  --  13*  --  11*  --   ALKPHOS  --  40  --  39  --   BILITOT  --  0.8  --  0.8  --    ------------------------------------------------------------------------------------------------------------------ estimated creatinine clearance is 9.2 mL/min (by C-G formula based on Cr of 6.04). ------------------------------------------------------------------------------------------------------------------ No results for input(s): HGBA1C in the last 72 hours. ------------------------------------------------------------------------------------------------------------------ No results for input(s): CHOL, HDL, LDLCALC, TRIG, CHOLHDL, LDLDIRECT in the last 72 hours. ------------------------------------------------------------------------------------------------------------------  Recent Labs  08/07/15 1605  TSH 0.918   ------------------------------------------------------------------------------------------------------------------ No results for input(s): VITAMINB12, FOLATE, FERRITIN, TIBC, IRON,  RETICCTPCT in the last 72 hours.  Coagulation profile  Recent Labs Lab 08/05/15 2110 08/08/15 0655 08/09/15 0623 08/10/15 0500  INR 1.18 1.25 1.22 1.23    No results for input(s): DDIMER in the last 72 hours.  Cardiac Enzymes  Recent Labs Lab 08/05/15 2110 08/06/15 1326 08/06/15 1829  TROPONINI 0.33* 0.18* 0.21*   ------------------------------------------------------------------------------------------------------------------ Invalid input(s): POCBNP   CBG:  Recent Labs Lab 08/05/15 1445  GLUCAP 162*       Studies: No results found.    Lab Results  Component Value Date   HGBA1C 5.5 08/05/2015   HGBA1C 5.9* 03/30/2012   Lab Results  Component Value Date   CREATININE 6.04* 08/10/2015       Scheduled Meds: . amiodarone  400 mg Oral BID  . calcitRIOL  0.25 mcg Oral Daily  . carvedilol  6.25 mg Oral BID WC  . donepezil  5 mg Oral QHS  . feeding supplement (NEPRO CARB STEADY)  237 mL Oral Q24H  . sodium chloride  3 mL Intravenous Q12H  . Warfarin - Pharmacist Dosing Inpatient   Does not apply q1800   Continuous Infusions: . heparin 750 Units/hr (08/10/15 0443)  .  sodium bicarbonate 150 mEq in sterile water 1000 mL infusion 150 mEq (08/09/15 1700)    Principal Problem:   Dizziness Active Problems:   Chronic kidney disease (CKD), stage IV (severe) (HCC)   Pneumonia   Diabetes  mellitus type 2, diet-controlled (HCC)   CAD (coronary artery disease)   Status post aortic valve replacement   Protein-calorie malnutrition, severe   Dyspnea   Palliative care encounter    Time spent: 45 minutes   [facility]  [facility] Pager [phone]. If 7PM-7AM, please contact night-coverage at [email or web address], password [facility] 08/10/2015, 11:27 AM  LOS: 5 days

## 2015-08-10 NOTE — Progress Notes (Signed)
Speech Language Pathology Treatment: Dysphagia  Patient Details Name: [name] GammaClarence E [name] MRN: [id number] DOB: 04/26/1933 Today's Date: 08/10/2015 Time: [id number] SLP Time Calculation (min) (ACUTE ONLY): 17 min  Assessment / Plan / Recommendation Clinical Impression  Treatment focused on trials with thin liquids to determine ability to upgrade. Continues with consistent mildly delayed and congested cough with thin liquids. Chin tuck posture yielded similar results to head in neutral position. Given continued s/s with thin recommend MBS (10/18 am), Dys 3 and nectar thick liquids.   HPI Other Pertinent Information: 81 y.o. male with past medical history of CKD stage 4-5, dementia, CHF and hypertension admitted with dizziness. MRI no acute infarct. Remote bilateral centrum semiovale and basal ganglia infarcts. CXR Stable cardiomegaly Stable left pleural thickening versus effusion. Per MD note pt diagnosed with pneumonia in the ED, however CT scan does not show any evidence of pneumonia, no pleural effusion, no vascular congestion.   Pertinent Vitals Pain Assessment: No/denies pain  SLP Plan  Continue with current plan of care;MBS    Recommendations Diet recommendations: Dysphagia 3 (mechanical soft);Nectar-thick liquid Liquids provided via: Cup;No straw Medication Administration: Whole meds with puree Supervision: Patient able to self feed;Intermittent supervision to cue for compensatory strategies Compensations: Small sips/bites;Check for pocketing;Slow rate Postural Changes and/or Swallow Maneuvers: Seated upright 90 degrees              Oral Care Recommendations: Oral care BID Follow up Recommendations:  (TBD) Plan: Continue with current plan of care;MBS         Royce MacadamiaLitaker, Eri Mcevers Willis 08/10/2015, 11:32 AM   Breck CoonsLisa Willis Lonell FaceLitaker M.Ed [employer]-SLP Pager [phone]

## 2015-08-10 NOTE — Care Management Important Message (Signed)
Important Message  Patient Details  Name: [name] [name] MRN: [id number] Date of Birth: 05/06/1933   Medicare Important Message Given:  Yes-third notification given    Orson AloeMegan P Joao Mccurdy 08/10/2015, 1:39 PM

## 2015-08-10 NOTE — Progress Notes (Signed)
Patient had 10 beat run of ventricular tachycardia. Asymptomatic.  Notified Claiborne Billingsallahan NP.  Will continue to monitor.

## 2015-08-10 NOTE — Progress Notes (Signed)
Physical Therapy Treatment Patient Details Name: [name] [name] MRN: [id number] DOB: 1932/12/12 Today's Date: 08/10/2015    History of Present Illness 81 yo male admitted from home alone with dizziness. pt in ED found to have creatinine worsening to 5.5 and tachycardia.  PMH: dementia, CKD IV-V, CHF , HTN    PT Comments    Pt much improved cognitively and functionally. Pt with significant improvement in ambulation tolerance but does require RW for energy conservation due to onset of SOB and for stability for long distance ambulation. Con't to recommend SNF upon d/c.  Follow Up Recommendations  SNF     Equipment Recommendations  None recommended by PT    Recommendations for Other Services       Precautions / Restrictions Precautions Precautions: Fall Restrictions Weight Bearing Restrictions: No    Mobility  Bed Mobility Overal bed mobility: Modified Independent             General bed mobility comments: incrreased time but demo'd good technique  Transfers Overall transfer level: Needs assistance Equipment used: Rolling walker (2 wheeled) Transfers: Sit to/from Stand Sit to Stand: Min guard         General transfer comment: v/c's for safe hand placement  Ambulation/Gait Ambulation/Gait assistance: Min guard Ambulation Distance (Feet): 225 Feet Assistive device: Rolling walker (2 wheeled) Gait Pattern/deviations: Step-through pattern Gait velocity: decrased   General Gait Details: v/c's to stay in walker and not push to far out in front. pt with SOB but able to talk while ambulating. SpO2 at 77% upon return. Increased to 92% after 1 min of rest. RN and Charity fundraiser    Modified Rankin (Stroke Patients Only)       Balance           Standing balance support: During functional activity;No upper extremity supported Standing balance-Leahy Scale: Fair Standing balance comment: pt able to stand and  urinate without difficulty                    Cognition Arousal/Alertness: Awake/alert Behavior During Therapy: WFL for tasks assessed/performed Overall Cognitive Status: Within Functional Limits for tasks assessed                      Exercises      General Comments        Pertinent Vitals/Pain Pain Assessment: No/denies pain    Home Living                      Prior Function            PT Goals (current goals can now be found in the care plan section) Acute Rehab PT Goals Patient Stated Goal: none stated Progress towards PT goals: Progressing toward goals    Frequency  Min 3X/week    PT Plan Current plan remains appropriate    Co-evaluation             End of Session Equipment Utilized During Treatment: Gait belt Activity Tolerance: Patient tolerated treatment well Patient left: in bed;with call bell/phone within reach     Time: 1549-1616 PT Time Calculation (min) (ACUTE ONLY): 27 min  Charges:  $Gait Training: 23-37 mins                    G Codes:      Marcene Brawn 08/10/2015,  4:34 PM   Lewis ShockAshly Joesph Marcy, PT, DPT Pager #: [phone] Office #: [phone]

## 2015-08-10 NOTE — Progress Notes (Signed)
Subjective:  Up in chair, feels better today.  Continues to have coughing with possible aspiration being worked up for dysphagia  remains in atrial flutter.  Heart rate better controlled today and 90s.  INR remains subtherapeutic Objective:  Vital Signs in the last 24 hours: Temp:  [97.7 F (36.5 C)-98.3 F (36.8 C)] 97.7 F (36.5 C) (10/17 1055) Pulse Rate:  [58-118] 63 (10/17 1055) Resp:  [17-20] 20 (10/17 1055) BP: (144-177)/(80-140) 162/100 mmHg (10/17 1055) SpO2:  [98 %-99 %] 99 % (10/17 1055)  Intake/Output from previous day: 10/16 0701 - 10/17 0700 In: 1648.9 [P.O.:240; I.V.:1408.9] Out: 334 [Urine:325; Stool:9] Intake/Output from this shift: Total I/O In: 240 [P.O.:240] Out: -   Physical Exam: Neck: no adenopathy, no carotid bruit, no JVD and supple, symmetrical, trachea midline Lungs: decreased breath sounds at bases with bibasilar rhonchi Heart: regularly irregular rhythm, S1, S2 normal and soft systolic murmur noted Abdomen: soft, non-tender; bowel sounds normal; no masses,  no organomegaly Extremities: extremities normal, atraumatic, no cyanosis or edema  Lab Results:  Recent Labs  08/09/15 0623 08/10/15 0500  WBC 7.1 7.8  HGB 13.1 12.5*  PLT 126* 165    Recent Labs  08/09/15 0800 08/10/15 0500  NA 136 141  K 5.5* 3.7  CL 109 109  CO2 16* 20*  GLUCOSE 106* 91  BUN 76* 75*  CREATININE 5.93* 6.04*   No results for input(s): TROPONINI in the last 72 hours.  Invalid input(s): CK, MB Hepatic Function Panel  Recent Labs  08/09/15 0800 08/10/15 0500  PROT 6.0*  --   ALBUMIN 3.1* 3.1*  AST 27  --   ALT 11*  --   ALKPHOS 39  --   BILITOT 0.8  --    No results for input(s): CHOL in the last 72 hours. No results for input(s): PROTIME in the last 72 hours.  Imaging: Imaging results have been reviewed and No results found.  Cardiac Studies:  Assessment/Plan:  New-onset atrial flutter with moderate ventricular response.chadsvas score of  8 Coronary artery disease status post CABG in the past. History of severe aortic stenosis, status post bioprosthetic aortic valve replacement. Minimally elevated troponin I secondary to progressive renal failure/tachycardia without significant MI Bronchitis rule out pneumonia Possible dysphagia Hypertension. Diabetes mellitus History of nonsustained ventricular tachycardia and SVT in the past. Chronic progressive kidney disease stage IV. Hyperlipidemia. Dementia. Chronic thrombocytopenia History of old CVA Status post Hyperkalemia Plan Continue present management. Labs in a.m.  LOS: 5 days    [name] [name], [name] [name] 08/10/2015, 12:49 PM

## 2015-08-11 ENCOUNTER — Inpatient Hospital Stay (HOSPITAL_COMMUNITY): Payer: Medicare Other

## 2015-08-11 DIAGNOSIS — Z515 Encounter for palliative care: Secondary | ICD-10-CM

## 2015-08-11 LAB — CBC
HCT: 37.8 % — ABNORMAL LOW (ref 39.0–52.0)
Hemoglobin: 12.3 g/dL — ABNORMAL LOW (ref 13.0–17.0)
MCH: 30.4 pg (ref 26.0–34.0)
MCHC: 32.5 g/dL (ref 30.0–36.0)
MCV: 93.3 fL (ref 78.0–100.0)
PLATELETS: 157 10*3/uL (ref 150–400)
RBC: 4.05 MIL/uL — ABNORMAL LOW (ref 4.22–5.81)
RDW: 17.5 % — AB (ref 11.5–15.5)
WBC: 8.3 10*3/uL (ref 4.0–10.5)

## 2015-08-11 LAB — COMPREHENSIVE METABOLIC PANEL
ALT: 23 U/L (ref 17–63)
AST: 28 U/L (ref 15–41)
Albumin: 3.1 g/dL — ABNORMAL LOW (ref 3.5–5.0)
Alkaline Phosphatase: 36 U/L — ABNORMAL LOW (ref 38–126)
Anion gap: 12 (ref 5–15)
BILIRUBIN TOTAL: 0.4 mg/dL (ref 0.3–1.2)
BUN: 72 mg/dL — ABNORMAL HIGH (ref 6–20)
CHLORIDE: 108 mmol/L (ref 101–111)
CO2: 25 mmol/L (ref 22–32)
Calcium: 8.5 mg/dL — ABNORMAL LOW (ref 8.9–10.3)
Creatinine, Ser: 6.58 mg/dL — ABNORMAL HIGH (ref 0.61–1.24)
GFR, EST AFRICAN AMERICAN: 8 mL/min — AB (ref >60)
GFR, EST NON AFRICAN AMERICAN: 7 mL/min — AB (ref >60)
Glucose, Bld: 116 mg/dL — ABNORMAL HIGH (ref 65–99)
POTASSIUM: 3.9 mmol/L (ref 3.5–5.1)
Sodium: 145 mmol/L (ref 135–145)
TOTAL PROTEIN: 5.8 g/dL — AB (ref 6.5–8.1)

## 2015-08-11 LAB — PROTIME-INR
INR: 1.53 — AB (ref 0.00–1.49)
PROTHROMBIN TIME: 18.4 s — AB (ref 11.6–15.2)

## 2015-08-11 LAB — HEPARIN LEVEL (UNFRACTIONATED): HEPARIN UNFRACTIONATED: 0.53 [IU]/mL (ref 0.30–0.70)

## 2015-08-11 MED ORDER — WARFARIN SODIUM 7.5 MG PO TABS
7.5000 mg | ORAL_TABLET | Freq: Once | ORAL | Status: AC
  Administered 2015-08-11: 7.5 mg via ORAL
  Filled 2015-08-11: qty 1

## 2015-08-11 NOTE — Progress Notes (Signed)
[facility] PROGRESS NOTE  [name]:[id number] DOB: [birth year] DOA: 08/05/2015 PCP: Rinaldo Cloud, MD  Length of stay: 6   Assessment/Plan: Principal Problem:   Dizziness Active Problems:   Chronic kidney disease (CKD), stage IV (severe) (HCC)   Pneumonia   Diabetes mellitus type 2, diet-controlled (HCC)   CAD (coronary artery disease)   Status post aortic valve replacement   Protein-calorie malnutrition, severe   Dyspnea   Palliative care encounter     Acute on chronic kidney disease stage V Patient creatinine baseline was 4.5 about 2 years ago in May 2014. Patient followed by Dr.Colodonato Creatinine around 6.58 now, nephrology. Patient's Lasix, currently on IV sodium bicarbonate Because of dementia  Nephrology does  not recommend any dialysis at this time Defer fluid management to nephrology   Probable community-acquired pneumonia  CT scan does not show any evidence of pneumonia, no pleural effusion, no vascular congestion Concern for aspiration, however patient is not hypoxic and appears comfortable, therefore discontinued antibiotics, patient remained stable and afebrile Speech therapy consultation Dysphagia 3 (Mech soft);Nectar   continue bronchodilators and mucolytic  Chronic systolic/diastolic heart failure, does not appear to be in acute exacerbation   EF 45-50% on repeat echo, Lasix discontinued given low BP by nephrology and worsening renal function Discontinued bidil due to hypotension on 10/14. This can be restarted if the patient's blood pressure continues to increase  Lightheadedness, could be related to new onset atrial flutter ,Cardiology Dr. Particia Jasper has been consulted Tachycardic into the 120s, EKG shows atrial fibrillation from 10/13, repeat EKG today for comparison, continue to cycle cardiac enzymes, 2-D echo shows EF of 45-50%, continue telemetry PT/OT recommended SNF. MRI of the brain negative for acute CVA but showed old  infarcts   New-onset atrial flutter with RVR.History of severe aortic stenosis, status post bioprosthetic aortic valve replacement chadsvas score of 8 Cardiology, will continue to adjust amiodarone and Coreg VQ scan negative for PE Cardiology recommends anticoagulation with heparin drip/Coumadin, until INR therapeutic Until INR is therapeutic, patient remains inpatient Continue to follow platelet count closely given history of thrombocytopenia INR currently Subtherapeutic at 1.53  Hypertension controlled continue Coreg and hold  BiDil  Diabetes mellitus type 2 Diet-controlled, hemoglobin A1c  5.5, carbohydrate modified diet.  Status post aortic valve replacement Bioprosthetic valve, cardiology following    DVT prophylaxsis heparin  Code Status:      Code Status Orders        Start     Ordered   08/05/15 1953  Full code   Continuous     08/05/15 1952     Family Communication: family updated about patient's clinical progress Disposition Plan: Anticipate discharge to SNF in 2-3 days    Brief narrative: [name] [name] is a 81 y.o. male with past medical history of CKD stage 4-5, dementia, CHF and hypertension. Patient presented with dizziness. Patient seen with his sister-in-law at bedside, mentioned that he have dizziness for the past 2 weeks comes and goes increase with ambulation, upon further questioning this dizziness is likely lightheadedness. Patient also was started to have some cough with clear sputum. Patient called his cardiologist today who asked him to come to the hospital for further evaluation. In the ED was found to have creatinine worsening to 5.5, tachycardiac with lower extremity swelling, CT scan showed no acute events, CXR showed mild loculated effusion in the left side with cardiomegaly. Patient admitted to the hospital for further evaluation.  Consultants:  None  Procedures:  None  Antibiotics: Anti-infectives    Start     Dose/Rate Route  Frequency Ordered Stop   08/07/15 2200  azithromycin (ZITHROMAX) tablet 500 mg  Status:  Discontinued     500 mg Oral Daily at bedtime 08/07/15 0918 08/07/15 1315   08/06/15 1600  cefTRIAXone (ROCEPHIN) 1 g in dextrose 5 % 50 mL IVPB  Status:  Discontinued     1 g 100 mL/hr over 30 Minutes Intravenous Every 24 hours 08/05/15 1952 08/07/15 1315   08/05/15 2200  azithromycin (ZITHROMAX) 500 mg in dextrose 5 % 250 mL IVPB  Status:  Discontinued     500 mg 250 mL/hr over 60 Minutes Intravenous Every 24 hours 08/05/15 1952 08/07/15 0918   08/05/15 1545  cefTRIAXone (ROCEPHIN) 1 g in dextrose 5 % 50 mL IVPB     1 g 100 mL/hr over 30 Minutes Intravenous  Once 08/05/15 1535 08/05/15 1751         HPI/Subjective:  Patient is pleasantly confused, denies any chest pain or shortness of breath, no nausea vomiting  Objective: Filed Vitals:   08/10/15 1619 08/10/15 2100 08/11/15 0458 08/11/15 0956  BP: 144/96 157/92 166/78 140/89  Pulse: 114 113 82 76  Temp: 97.5 F (36.4 C) 97.7 F (36.5 C) 98.7 F (37.1 C) 97.7 F (36.5 C)  TempSrc: Oral Oral Oral Oral  Resp: 21 20 17 16   Height:      Weight:  68.221 kg (150 lb 6.4 oz)    SpO2: 96% 98% 94% 99%    Intake/Output Summary (Last 24 hours) at 08/11/15 1232 Last data filed at 08/11/15 0956  Gross per 24 hour  Intake 2052.5 ml  Output      4 ml  Net 2048.5 ml    Exam:  General: No acute respiratory distress Lungs: Clear to auscultation bilaterally without wheezes or crackles Cardiovascular: Regular rate and rhythm without murmur gallop or rub normal S1 and S2 Abdomen: Nontender, nondistended, soft, bowel sounds positive, no rebound, no ascites, no appreciable mass Extremities: No significant cyanosis, clubbing, or edema bilateral lower extremities     Data Review   Micro Results No results found for this or any previous visit (from the past 240 hour(s)).  Radiology Reports Dg Chest 1 View  08/06/2015  CLINICAL DATA:   Dyspnea. EXAM: CHEST 1 VIEW COMPARISON:  CT of the chest 08/05/2015 FINDINGS: Postsurgical changes from prior aortic valve replacement and probable CABG are stable. Cardiomediastinal silhouette is stably enlarged. Mediastinal contours appear intact. There is no evidence of focal airspace consolidation or pneumothorax. There is persistent left pleural thickening versus effusion. Osseous structures are without acute abnormality. Extensive osteoarthritic changes of bilateral glenohumeral joints are seen. Soft tissues are grossly normal. IMPRESSION: Stable cardiomegaly. Stable left pleural thickening versus effusion. Electronically Signed   By: Ted Mcalpine M.D.   On: 08/06/2015 17:38   Dg Chest 2 View  08/05/2015  CLINICAL DATA:  Three-week history of weakness and dizziness. Cough. EXAM: CHEST  2 VIEW COMPARISON:  Mar 01, 2013 FINDINGS: There is mild loculated effusion at the left base. There is no edema or consolidation. Heart is mildly enlarged with pulmonary vascularity within normal limits. No adenopathy. Patient is status post aortic valve replacement and coronary artery bypass grafting. No adenopathy. There is degenerative change in the thoracic spine. IMPRESSION: Mild loculated effusion left base. No edema or consolidation. Stable cardiomegaly. Electronically Signed   By: Bretta Bang III M.D.   On: 08/05/2015  15:05   Ct Head Wo Contrast  08/05/2015  CLINICAL DATA:  Intermittent dizziness for 3 weeks, confusion. Decreased appetite, weight loss. Headache. EXAM: CT HEAD WITHOUT CONTRAST TECHNIQUE: Contiguous axial images were obtained from the base of the skull through the vertex without intravenous contrast. COMPARISON:  None. FINDINGS: Old bilateral basal ganglia lacune or infarcts. Low-density throughout the deep white matter compatible with chronic small vessel disease. No acute infarction. No hemorrhage or hydrocephalus. No mass lesion or midline shift. No acute calvarial abnormality.  Visualized paranasal sinuses and mastoids clear. Orbital soft tissues unremarkable. IMPRESSION: Old bilateral basal ganglia lacunar infarcts. No acute intracranial abnormality. Atrophy, chronic microvascular disease. Electronically Signed   By: Charlett NoseKevin  Dover M.D.   On: 08/05/2015 16:59   Ct Chest Wo Contrast  08/05/2015  CLINICAL DATA:  81 year old male with cough, chills and possible left pleural effusion on recent chest radiograph EXAM: CT CHEST WITHOUT CONTRAST TECHNIQUE: Multidetector CT imaging of the chest was performed following the standard protocol without IV contrast. COMPARISON:  08/05/2015 and prior radiographs.  09/04/2006 CT FINDINGS: Mediastinum/Nodes: Cardiomegaly and aortic valve replacement again noted. Heavy coronary artery calcifications are identified. An ascending thoracic aortic aneurysm measuring 5 cm is unchanged. There is no evidence of pericardial effusion or enlarged lymph nodes. Lungs/Pleura: Unchanged left pleural thickening accounts for the recent radiographic abnormality. There is no evidence of pleural effusion. Mild bibasilar scarring is present. Moderate centrilobular emphysema is identified. There is no evidence of airspace disease, suspicious nodule, consolidation or mass. There is no evidence of pneumothorax. Upper abdomen: No acute abnormalities. Musculoskeletal: No acute or suspicious abnormalities. Mild degenerative disc disease in the lower thoracic spine noted. IMPRESSION: No evidence of acute abnormality. Chronic left pleural thickening accounts for abnormality identified on recent chest radiograph. No pleural effusion. Unchanged 5 cm ascending thoracic aortic aneurysm, cardiomegaly and coronary artery disease. Emphysema. Electronically Signed   By: Harmon PierJeffrey  Hu M.D.   On: 08/05/2015 20:54   Mr Brain Wo Contrast  08/06/2015  CLINICAL DATA:  81 year old hypertensive male with dementia and chronic kidney disease presenting with dizziness. Subsequent encounter. EXAM:  MRI HEAD WITHOUT CONTRAST TECHNIQUE: Multiplanar, multiecho pulse sequences of the brain and surrounding structures were obtained without intravenous contrast. COMPARISON:  08/05/2015 head CT. FINDINGS: No acute infarct. Remote bilateral centrum semiovale and basal ganglia infarcts. Remote left cerebellar infarct. Small vessel disease type changes. Blood breakdown products posterior left corona radiata and left occipital lobe probably related to prior episodes of hemorrhagic ischemia. Moderate global atrophy without hydrocephalus. No intracranial mass lesion noted on this unenhanced exam. Ectatic patent major intracranial arterial structures. Cervical medullary junction, pituitary region, pineal region and orbital structures unremarkable. IMPRESSION: No acute infarct. Remote bilateral centrum semiovale and basal ganglia infarcts. Remote left cerebellar infarct. Small vessel disease type changes. Moderate global atrophy without hydrocephalus. Electronically Signed   By: Lacy DuverneySteven  Olson M.D.   On: 08/06/2015 21:52   Nm Pulmonary Perf And Vent  08/07/2015  CLINICAL DATA:  Shortness breath.  Dizziness for 2 weeks. EXAM: NUCLEAR MEDICINE VENTILATION - PERFUSION LUNG SCAN TECHNIQUE: Ventilation images were obtained in multiple projections using inhaled aerosol Tc-2174m DTPA. Perfusion images were obtained in multiple projections after intravenous injection of Tc-6674m MAA. RADIOPHARMACEUTICALS:  40 Technetium-4874m DTPA aerosol inhalation and 6.0 Technetium-2574m MAA IV COMPARISON:  One-view chest x-ray from the same day. FINDINGS: Ventilation: There is absent undulation in the left lower lobe. There is also segmental defect in the superior segment of the left upper lobe. The right  lung uptake is normal. Perfusion: Absent per fusion is matched in the left lower lobe. A matched defect is present in the superior segment of the left upper lobe. The right lung perfuses normally. There is some clumping of radiopharmaceutical at the  hilum. IMPRESSION: 1. Matched left lower lobe and left upper lobe defects. This represents a very low probability (0 - 9%) of pulmonary embolus using Pioped II criteria. Electronically Signed   By: Marin Roberts M.D.   On: 08/07/2015 10:22     CBC  Recent Labs Lab 08/07/15 0555 08/08/15 0547 08/09/15 0623 08/10/15 0500 08/11/15 0438  WBC 6.9 8.9 7.1 7.8 8.3  HGB 12.5* 13.1 13.1 12.5* 12.3*  HCT 37.2* 39.9 40.2 38.2* 37.8*  PLT 124* 172 126* 165 157  MCV 91.2 93.0 93.3 91.2 93.3  MCH 30.6 30.5 30.4 29.8 30.4  MCHC 33.6 32.8 32.6 32.7 32.5  RDW 17.8* 17.9* 17.5* 17.5* 17.5*    Chemistries   Recent Labs Lab 08/07/15 0555 08/08/15 0547 08/09/15 0800 08/10/15 0500 08/11/15 0438  NA 136 137 136 141 145  K 4.4 4.9 5.5* 3.7 3.9  CL 108 107 109 109 108  CO2 16* 17* 16* 20* 25  GLUCOSE 115* 88 106* 91 116*  BUN 66* 69* 76* 75* 72*  CREATININE 5.64* 5.80* 5.93* 6.04* 6.58*  CALCIUM 8.2* 8.2* 8.3* 8.3* 8.5*  MG  --   --   --  1.9  --   AST 17  --  27  --  28  ALT 13*  --  11*  --  23  ALKPHOS 40  --  39  --  36*  BILITOT 0.8  --  0.8  --  0.4   ------------------------------------------------------------------------------------------------------------------ estimated creatinine clearance is 8.6 mL/min (by C-G formula based on Cr of 6.58). ------------------------------------------------------------------------------------------------------------------ No results for input(s): HGBA1C in the last 72 hours. ------------------------------------------------------------------------------------------------------------------ No results for input(s): CHOL, HDL, LDLCALC, TRIG, CHOLHDL, LDLDIRECT in the last 72 hours. ------------------------------------------------------------------------------------------------------------------ No results for input(s): TSH, T4TOTAL, T3FREE, THYROIDAB in the last 72 hours.  Invalid input(s):  FREET3 ------------------------------------------------------------------------------------------------------------------ No results for input(s): VITAMINB12, FOLATE, FERRITIN, TIBC, IRON, RETICCTPCT in the last 72 hours.  Coagulation profile  Recent Labs Lab 08/05/15 2110 08/08/15 0655 08/09/15 0623 08/10/15 0500 08/11/15 0438  INR 1.18 1.25 1.22 1.23 1.53*    No results for input(s): DDIMER in the last 72 hours.  Cardiac Enzymes  Recent Labs Lab 08/05/15 2110 08/06/15 1326 08/06/15 1829  TROPONINI 0.33* 0.18* 0.21*   ------------------------------------------------------------------------------------------------------------------ Invalid input(s): POCBNP   CBG:  Recent Labs Lab 08/05/15 1445  GLUCAP 162*       Studies: No results found.    Lab Results  Component Value Date   HGBA1C 5.5 08/05/2015   HGBA1C 5.9* 03/30/2012   Lab Results  Component Value Date   CREATININE 6.58* 08/11/2015       Scheduled Meds: . amiodarone  400 mg Oral BID  . calcitRIOL  0.25 mcg Oral Daily  . carvedilol  6.25 mg Oral BID WC  . donepezil  5 mg Oral QHS  . feeding supplement (NEPRO CARB STEADY)  237 mL Oral Q24H  . sodium chloride  3 mL Intravenous Q12H  . Warfarin - Pharmacist Dosing Inpatient   Does not apply q1800   Continuous Infusions: . heparin 750 Units/hr (08/11/15 1153)  .  sodium bicarbonate 150 mEq in sterile water 1000 mL infusion 150 mEq (08/11/15 1002)    Principal Problem:   Dizziness Active Problems:  Chronic kidney disease (CKD), stage IV (severe) (HCC)   Pneumonia   Diabetes mellitus type 2, diet-controlled (HCC)   CAD (coronary artery disease)   Status post aortic valve replacement   Protein-calorie malnutrition, severe   Dyspnea   Palliative care encounter    Time spent: 45 minutes   [facility]  [facility] Pager [phone]. If 7PM-7AM, please contact night-coverage at [email or web address], password [facility] 08/11/2015, 12:32  PM  LOS: 6 days

## 2015-08-11 NOTE — Progress Notes (Signed)
Assessment/Plan: 1. 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 Stop furosemide   2. New onset Atrial Fib/A flutter- per cardiology(Amio,coreg,coumadin) 3. HTN- stable 4. CHF- possibly related to new A fib. Await cardiology input. 5. Protein malnutrition- renal diet 6. FTT/Dementia...not sure how much of this is uremia  Subjective: Interval History: none.  Objective: Vital signs in last 24 hours: Temp:  [97.5 F (36.4 C)-98.7 F (37.1 C)] 97.7 F (36.5 C) (10/18 0956) Pulse Rate:  [63-114] 76 (10/18 0956) Resp:  [16-21] 16 (10/18 0956) BP: (140-166)/(78-100) 140/89 mmHg (10/18 0956) SpO2:  [94 %-99 %] 99 % (10/18 0956) Weight:  [68.221 kg (150 lb 6.4 oz)] 68.221 kg (150 lb 6.4 oz) (10/17 2100) Weight change:   Intake/Output from previous day: 10/17 0701 - 10/18 0700 In: 240 [P.O.:240] Out: 4 [Urine:2; Stool:2] Intake/Output this shift: Total I/O In: 2052.5 [P.O.:120; I.V.:1932.5] Out: 0   General appearance: alert and cooperative Resp: clear to auscultation bilaterally Cardio: normal apical impulse GI: soft, non-tender; bowel sounds normal; no masses,  no organomegaly Extremities: extremities normal, atraumatic, no cyanosis or edema  Lab Results:  Recent Labs  08/10/15 0500 08/11/15 0438  WBC 7.8 8.3  HGB 12.5* 12.3*  HCT 38.2* 37.8*  PLT 165 157   BMET:  Recent Labs  08/10/15 0500 08/11/15 0438  NA 141 145  K 3.7 3.9  CL 109 108  CO2 20* 25  GLUCOSE 91 116*  BUN 75* 72*  CREATININE 6.04* 6.58*  CALCIUM 8.3* 8.5*   No results for input(s): PTH in the last 72 hours. Iron Studies: No results for input(s): IRON, TIBC, TRANSFERRIN, FERRITIN in the last 72 hours. Studies/Results: No results found.  Scheduled: . amiodarone  400 mg Oral BID  . calcitRIOL  0.25 mcg Oral Daily  . carvedilol  6.25 mg Oral BID WC  . donepezil  5 mg Oral QHS  . feeding supplement (NEPRO CARB  STEADY)  237 mL Oral Q24H  . furosemide  40 mg Oral BID  . sodium chloride  3 mL Intravenous Q12H  . Warfarin - Pharmacist Dosing Inpatient   Does not apply q1800     LOS: 6 days   Walburga Hudman C 08/11/2015,10:12 AM

## 2015-08-11 NOTE — Progress Notes (Signed)
Daily Progress Note   Patient Name: [name] [name]       Date: 08/11/2015 DOB: [birth year]  Age: 81 y.o. MRN#: [id number] Attending Physician: Reyne Dumas, MD Primary Care Physician: Charolette Forward, MD Admit Date: 08/05/2015  Reason for Consultation/Follow-up: Establishing goals of care  Subjective:     I met with [name] [name] today and he is alert and very pleasant gentleman. He is very happy and has no complaints. He has eaten all his lunch and drinking a yogurt smoothies family brought him. He does not remember why he is in the hospital. Offered reassurance.  I spoke with his sister-in-law who is also very pleasant. She is questioning her decision to not do dialysis (I believe other family members are questioning her). She says that he has always said he would NOT want dialysis. I explained that dialysis is not recommended from cardiology or nephrology. I explained that I agree to support his decision for no dialysis because he is feeling good now - this may not last very long but he is not suffering and putting him through dialysis might would extend his life but is likely to cause him suffering and a poor QOL. She said she understand and feels much better about this decision. She understands that kidneys continue to worsen despite best efforts and time may be very short.    Length of Stay: 6 days  Current Medications: Scheduled Meds:  . amiodarone  400 mg Oral BID  . calcitRIOL  0.25 mcg Oral Daily  . carvedilol  6.25 mg Oral BID WC  . donepezil  5 mg Oral QHS  . feeding supplement (NEPRO CARB STEADY)  237 mL Oral Q24H  . sodium chloride  3 mL Intravenous Q12H  . Warfarin - Pharmacist Dosing Inpatient   Does not apply q1800    Continuous Infusions: . heparin 750 Units/hr (08/11/15 1153)  .  sodium bicarbonate 150 mEq in sterile water 1000 mL infusion 150 mEq (08/11/15 1002)    PRN Meds: acetaminophen **OR** acetaminophen, hydrALAZINE, metoprolol, morphine injection,  ondansetron **OR** ondansetron (ZOFRAN) IV, RESOURCE THICKENUP CLEAR  Palliative Performance Scale: 30%     Vital Signs: BP 140/89 mmHg  Pulse 76  Temp(Src) 97.7 F (36.5 C) (Oral)  Resp 16  Ht $R'5\' 10"'CU$  (1.778 m)  Wt 68.221 kg (150 lb 6.4 oz)  BMI 21.58 kg/m2  SpO2 99% SpO2: SpO2: 99 % O2 Device: O2 Device: Not Delivered O2 Flow Rate:    Intake/output summary:  Intake/Output Summary (Last 24 hours) at 08/11/15 1351 Last data filed at 08/11/15 0956  Gross per 24 hour  Intake 2052.5 ml  Output      2 ml  Net 2050.5 ml   LBM: Last BM Date: 08/10/15 Baseline Weight: Weight: 63.866 kg (140 lb 12.8 oz) Most recent weight: Weight: 68.221 kg (150 lb 6.4 oz)  Physical Exam:              General: NAD, sitting up in bed, pleasant HEENT: [place]/AT Resp: No labored breathing Abd: Soft, NT, ND Neuro: Confused but knows who he is and that he is in hospital but not what for   Additional Data Reviewed: Recent Labs     08/10/15  0500  08/11/15  0438  WBC  7.8  8.3  HGB  12.5*  12.3*  PLT  165  157  NA  141  145  BUN  75*  72*  CREATININE  6.04*  6.58*     Problem List:  Patient Active Problem List   Diagnosis Date Noted  . Dyspnea   . Palliative care encounter   . Protein-calorie malnutrition, severe 08/06/2015  . Pneumonia 08/05/2015  . Diabetes mellitus type 2, diet-controlled ([place]) 08/05/2015  . Dizziness 08/05/2015  . CAD (coronary artery disease) 08/05/2015  . Status post aortic valve replacement 08/05/2015  . Chronic kidney disease (CKD), stage IV (severe) ([place]) 08/09/2013     Palliative Care Assessment & Plan    Code Status:  DNR  Goals of Care:  Sister-in-law agrees with no dialysis per his wishes although was questioning this decision. She understands time is limited and that his renal function continues to worsen despite our efforts with dialysis. I plan on discussing hospice but given her difficulty with decision to not change her mind and request  dialysis for him I will wait to bring this up tomorrow.   3. Symptom Management:  Denies any pain/discomfort.    5. Prognosis: Likely days with worsening renal function.   5. Discharge Planning: I will recommend hospice facility. Will need to discuss this more with surrogate decision maker.    Thank you for allowing the Palliative Medicine Team to assist in the care of this patient.   Time In: 1325 Time Out: 1355 Total Time 29min Prolonged Time Billed  no     Greater than 50%  of this time was spent counseling and coordinating care related to the above assessment and plan.   Vinie Sill, NP Palliative Medicine Team Pager # [phone] (M-F 8a-5p) Team Phone # [phone] (Nights/Weekends)  08/11/2015, 1:51 PM

## 2015-08-11 NOTE — Progress Notes (Signed)
Speech Pathology    MBSS complete. Full report located under chart review in imaging section. Double click on DG swallow function. Recommend continue Dys 3 texture, thin, small sips, no straws.     Breck CoonsLisa Willis EastonLitaker M.Ed [employer]-SLP Pager [phone]

## 2015-08-11 NOTE — Progress Notes (Signed)
Subjective:  Patient denies any chest pain or shortness of breath complains of feeling generalized weak and tired. Remains in atrial flutter with controlled ventricular response. PT/INR hopefully will be in therapeutic range tomorrow.  Objective:  Vital Signs in the last 24 hours: Temp:  [97.5 F (36.4 C)-98.7 F (37.1 C)] 98.7 F (37.1 C) (10/18 0458) Pulse Rate:  [63-114] 82 (10/18 0458) Resp:  [17-21] 17 (10/18 0458) BP: (144-166)/(78-100) 166/78 mmHg (10/18 0458) SpO2:  [94 %-99 %] 94 % (10/18 0458) Weight:  [68.221 kg (150 lb 6.4 oz)] 68.221 kg (150 lb 6.4 oz) (10/17 2100)  Intake/Output from previous day: 10/17 0701 - 10/18 0700 In: 240 [P.O.:240] Out: 4 [Urine:2; Stool:2] Intake/Output from this shift: Total I/O In: 1932.5 [I.V.:1932.5] Out: -   Physical Exam: Exam unchanged  Lab Results:  Recent Labs  08/10/15 0500 08/11/15 0438  WBC 7.8 8.3  HGB 12.5* 12.3*  PLT 165 157    Recent Labs  08/10/15 0500 08/11/15 0438  NA 141 145  K 3.7 3.9  CL 109 108  CO2 20* 25  GLUCOSE 91 116*  BUN 75* 72*  CREATININE 6.04* 6.58*   No results for input(s): TROPONINI in the last 72 hours.  Invalid input(s): CK, MB Hepatic Function Panel  Recent Labs  08/11/15 0438  PROT 5.8*  ALBUMIN 3.1*  AST 28  ALT 23  ALKPHOS 36*  BILITOT 0.4   No results for input(s): CHOL in the last 72 hours. No results for input(s): PROTIME in the last 72 hours.  Imaging: Imaging results have been reviewed and No results found.  Cardiac Studies:  Assessment/Plan:  New-onset atrial flutter with controlled ventricular response.chadsvas score of 8 Coronary artery disease status post CABG in the past. History of severe aortic stenosis, status post bioprosthetic aortic valve replacement. Minimally elevated troponin I secondary to progressive renal failure/tachycardia without significant MI Bronchitis rule out pneumonia Possible dysphagia Hypertension. Diabetes  mellitus History of nonsustained ventricular tachycardia and SVT in the past. Chronic progressive kidney disease stage IV. Hyperlipidemia. Dementia. Chronic thrombocytopenia History of old CVA Plan Continue present management We will reduce amiodarone dose to 200 mg twice daily prior to discharge. Increase ambulation as tolerated  LOS: 6 days    [name] [name], [name] [name] 08/11/2015, 9:11 AM

## 2015-08-11 NOTE — Progress Notes (Signed)
ANTICOAGULATION CONSULT NOTE   Pharmacy Consult for Heparin + warfarin Indication: atrial fibrillation  Allergies  Allergen Reactions  . Clams [Shellfish Allergy]     Gout     Patient Measurements: Height: 5\' 10"  (177.8 cm) Weight: 150 lb 6.4 oz (68.221 kg) IBW/kg (Calculated) : 73   Vital Signs: Temp: 98.7 F (37.1 C) (10/18 0458) Temp Source: Oral (10/18 0458) BP: 166/78 mmHg (10/18 0458) Pulse Rate: 82 (10/18 0458)  Labs:  Recent Labs  08/09/15 0623 08/09/15 0800 08/09/15 1939 08/10/15 0500 08/11/15 0438  HGB 13.1  --   --  12.5* 12.3*  HCT 40.2  --   --  38.2* 37.8*  PLT 126*  --   --  165 157  LABPROT 15.5*  --   --  15.7* 18.4*  INR 1.22  --   --  1.23 1.53*  HEPARINUNFRC 0.21*  --  0.35 0.45 0.53  CREATININE  --  5.93*  --  6.04* 6.58*    Estimated Creatinine Clearance: 8.6 mL/min (by C-G formula based on Cr of 6.58).   Assessment: 81 yo M with dementia and ARF with new onset AFib. He was started on IV heparin and warfarin for anticoagulation. CHADSVASC = 8, and not rate controlled. Not a good candidate for Lovenox d/t ARF. INR increased this morning to 1.53, HL remains therapeutic at 0.53units/mL. Hgb 12.3, ptls 157- both stable and no bleeding noted.  Goal of Therapy:  INR 2-3 Heparin level 0.3-0.7 units/ml Monitor platelets by anticoagulation protocol: Yes   Plan:  -Continue heparin gtt at 750 units/hr -warfarin 7.5mg  po x1 tonight  -Monitor daily HL / INR, CBC, s/s of bleed  Aariyah Sampey D. Adreena Willits, PharmD, BCPS Clinical Pharmacist Pager: [phone] 08/11/2015 7:47 AM

## 2015-08-12 DIAGNOSIS — E119 Type 2 diabetes mellitus without complications: Secondary | ICD-10-CM

## 2015-08-12 LAB — HEPARIN LEVEL (UNFRACTIONATED): HEPARIN UNFRACTIONATED: 0.49 [IU]/mL (ref 0.30–0.70)

## 2015-08-12 LAB — COMPREHENSIVE METABOLIC PANEL
ALBUMIN: 2.9 g/dL — AB (ref 3.5–5.0)
ALT: 27 U/L (ref 17–63)
AST: 30 U/L (ref 15–41)
Alkaline Phosphatase: 33 U/L — ABNORMAL LOW (ref 38–126)
Anion gap: 10 (ref 5–15)
BUN: 73 mg/dL — AB (ref 6–20)
CHLORIDE: 100 mmol/L — AB (ref 101–111)
CO2: 31 mmol/L (ref 22–32)
CREATININE: 6.54 mg/dL — AB (ref 0.61–1.24)
Calcium: 8.3 mg/dL — ABNORMAL LOW (ref 8.9–10.3)
GFR calc Af Amer: 8 mL/min — ABNORMAL LOW (ref >60)
GFR, EST NON AFRICAN AMERICAN: 7 mL/min — AB (ref >60)
GLUCOSE: 125 mg/dL — AB (ref 65–99)
Potassium: 4.3 mmol/L (ref 3.5–5.1)
Sodium: 141 mmol/L (ref 135–145)
Total Bilirubin: 0.4 mg/dL (ref 0.3–1.2)
Total Protein: 5.7 g/dL — ABNORMAL LOW (ref 6.5–8.1)

## 2015-08-12 LAB — PROTIME-INR
INR: 2.05 — ABNORMAL HIGH (ref 0.00–1.49)
Prothrombin Time: 23 seconds — ABNORMAL HIGH (ref 11.6–15.2)

## 2015-08-12 MED ORDER — CARVEDILOL 12.5 MG PO TABS
12.5000 mg | ORAL_TABLET | Freq: Two times a day (BID) | ORAL | Status: DC
  Administered 2015-08-12: 12.5 mg via ORAL
  Filled 2015-08-12: qty 1

## 2015-08-12 MED ORDER — CARVEDILOL 12.5 MG PO TABS: 12.5000 mg | ORAL_TABLET | Freq: Two times a day (BID) | ORAL | Status: AC

## 2015-08-12 MED ORDER — AMIODARONE HCL 200 MG PO TABS: 200.0000 mg | ORAL_TABLET | Freq: Two times a day (BID) | ORAL | Status: AC

## 2015-08-12 MED ORDER — AMIODARONE HCL 200 MG PO TABS: 200.0000 mg | ORAL_TABLET | Freq: Two times a day (BID) | ORAL | Status: DC

## 2015-08-12 MED ORDER — WARFARIN SODIUM 5 MG PO TABS: 5.0000 mg | ORAL_TABLET | Freq: Every day | ORAL | Status: AC

## 2015-08-12 MED ORDER — WARFARIN SODIUM 5 MG PO TABS
5.0000 mg | ORAL_TABLET | Freq: Once | ORAL | Status: AC
  Administered 2015-08-12: 5 mg via ORAL
  Filled 2015-08-12: qty 1

## 2015-08-12 NOTE — Discharge Summary (Signed)
Physician Discharge Summary  [name] [name] [name] [name] DOB/AGE: [birth year] 81 y.o.  PCP: Rinaldo Cloud, MD   Admit date: 08/05/2015 Discharge date: 08/12/2015  Discharge Diagnoses:   Principal Problem:   Dizziness Active Problems:   Chronic kidney disease (CKD), stage IV (severe) (HCC)   Pneumonia   Diabetes mellitus type 2, diet-controlled (HCC)   CAD (coronary artery disease)   Status post aortic valve replacement   Protein-calorie malnutrition, severe   Dyspnea   Palliative care encounter    Follow-up recommendations Follow-up with PCP in 3-5 days , including all  additional recommended appointments as below Follow-up CBC, CMP in 3-5 days Speech therapy recommendations Recommend continue Dys 3 texture, thin, small sips, no straws. Check INR every 48 hours Follow-up with cardiology in 2 weeks    Medication List    STOP taking these medications        furosemide 40 MG tablet  Commonly known as:  LASIX     isosorbide-hydrALAZINE 20-37.5 MG tablet  Commonly known as:  BIDIL      TAKE these medications        amiodarone 200 MG tablet  Commonly known as:  PACERONE  Take 1 tablet (200 mg total) by mouth 2 (two) times daily.     calcitRIOL 0.25 MCG capsule  Commonly known as:  ROCALTROL  Take 0.25 mcg by mouth daily.     carvedilol 12.5 MG tablet  Commonly known as:  COREG  Take 1 tablet (12.5 mg total) by mouth 2 (two) times daily with a meal.     donepezil 5 MG tablet  Commonly known as:  ARICEPT  Take 5 mg by mouth at bedtime.     feeding supplement (NEPRO CARB STEADY) Liqd  Take 237 mLs by mouth daily.     memantine 5 MG tablet  Commonly known as:  NAMENDA  Take 1 tablet (5 mg total) by mouth daily.     warfarin 5 MG tablet  Commonly known as:  COUMADIN  Take 1 tablet (5 mg total) by mouth daily.         Discharge Condition: Prognosis poor    Disposition: SNF   Consults: Palliative  care Cardiology Nephrology     Significant Diagnostic Studies:  Dg Chest 1 View  08/06/2015  CLINICAL DATA:  Dyspnea. EXAM: CHEST 1 VIEW COMPARISON:  CT of the chest 08/05/2015 FINDINGS: Postsurgical changes from prior aortic valve replacement and probable CABG are stable. Cardiomediastinal silhouette is stably enlarged. Mediastinal contours appear intact. There is no evidence of focal airspace consolidation or pneumothorax. There is persistent left pleural thickening versus effusion. Osseous structures are without acute abnormality. Extensive osteoarthritic changes of bilateral glenohumeral joints are seen. Soft tissues are grossly normal. IMPRESSION: Stable cardiomegaly. Stable left pleural thickening versus effusion. Electronically Signed   By: Ted Mcalpine M.D.   On: 08/06/2015 17:38   Dg Chest 2 View  08/05/2015  CLINICAL DATA:  Three-week history of weakness and dizziness. Cough. EXAM: CHEST  2 VIEW COMPARISON:  Mar 01, 2013 FINDINGS: There is mild loculated effusion at the left base. There is no edema or consolidation. Heart is mildly enlarged with pulmonary vascularity within normal limits. No adenopathy. Patient is status post aortic valve replacement and coronary artery bypass grafting. No adenopathy. There is degenerative change in the thoracic spine. IMPRESSION: Mild loculated effusion left base. No edema or consolidation. Stable cardiomegaly. Electronically Signed   By: Bretta Bang III M.D.   On: 08/05/2015 15:05  Ct Head Wo Contrast  08/05/2015  CLINICAL DATA:  Intermittent dizziness for 3 weeks, confusion. Decreased appetite, weight loss. Headache. EXAM: CT HEAD WITHOUT CONTRAST TECHNIQUE: Contiguous axial images were obtained from the base of the skull through the vertex without intravenous contrast. COMPARISON:  None. FINDINGS: Old bilateral basal ganglia lacune or infarcts. Low-density throughout the deep white matter compatible with chronic small vessel disease. No  acute infarction. No hemorrhage or hydrocephalus. No mass lesion or midline shift. No acute calvarial abnormality. Visualized paranasal sinuses and mastoids clear. Orbital soft tissues unremarkable. IMPRESSION: Old bilateral basal ganglia lacunar infarcts. No acute intracranial abnormality. Atrophy, chronic microvascular disease. Electronically Signed   By: Rolm Baptise M.D.   On: 08/05/2015 16:59   Ct Chest Wo Contrast  08/05/2015  CLINICAL DATA:  81 year old male with cough, chills and possible left pleural effusion on recent chest radiograph EXAM: CT CHEST WITHOUT CONTRAST TECHNIQUE: Multidetector CT imaging of the chest was performed following the standard protocol without IV contrast. COMPARISON:  08/05/2015 and prior radiographs.  09/04/2006 CT FINDINGS: Mediastinum/Nodes: Cardiomegaly and aortic valve replacement again noted. Heavy coronary artery calcifications are identified. An ascending thoracic aortic aneurysm measuring 5 cm is unchanged. There is no evidence of pericardial effusion or enlarged lymph nodes. Lungs/Pleura: Unchanged left pleural thickening accounts for the recent radiographic abnormality. There is no evidence of pleural effusion. Mild bibasilar scarring is present. Moderate centrilobular emphysema is identified. There is no evidence of airspace disease, suspicious nodule, consolidation or mass. There is no evidence of pneumothorax. Upper abdomen: No acute abnormalities. Musculoskeletal: No acute or suspicious abnormalities. Mild degenerative disc disease in the lower thoracic spine noted. IMPRESSION: No evidence of acute abnormality. Chronic left pleural thickening accounts for abnormality identified on recent chest radiograph. No pleural effusion. Unchanged 5 cm ascending thoracic aortic aneurysm, cardiomegaly and coronary artery disease. Emphysema. Electronically Signed   By: Margarette Canada M.D.   On: 08/05/2015 20:54   Mr Brain Wo Contrast  08/06/2015  CLINICAL DATA:  81 year old  hypertensive male with dementia and chronic kidney disease presenting with dizziness. Subsequent encounter. EXAM: MRI HEAD WITHOUT CONTRAST TECHNIQUE: Multiplanar, multiecho pulse sequences of the brain and surrounding structures were obtained without intravenous contrast. COMPARISON:  08/05/2015 head CT. FINDINGS: No acute infarct. Remote bilateral centrum semiovale and basal ganglia infarcts. Remote left cerebellar infarct. Small vessel disease type changes. Blood breakdown products posterior left corona radiata and left occipital lobe probably related to prior episodes of hemorrhagic ischemia. Moderate global atrophy without hydrocephalus. No intracranial mass lesion noted on this unenhanced exam. Ectatic patent major intracranial arterial structures. Cervical medullary junction, pituitary region, pineal region and orbital structures unremarkable. IMPRESSION: No acute infarct. Remote bilateral centrum semiovale and basal ganglia infarcts. Remote left cerebellar infarct. Small vessel disease type changes. Moderate global atrophy without hydrocephalus. Electronically Signed   By: Genia Del M.D.   On: 08/06/2015 21:52   Nm Pulmonary Perf And Vent  08/07/2015  CLINICAL DATA:  Shortness breath.  Dizziness for 2 weeks. EXAM: NUCLEAR MEDICINE VENTILATION - PERFUSION LUNG SCAN TECHNIQUE: Ventilation images were obtained in multiple projections using inhaled aerosol Tc-34m DTPA. Perfusion images were obtained in multiple projections after intravenous injection of Tc-70m MAA. RADIOPHARMACEUTICALS:  40 Technetium-44m DTPA aerosol inhalation and 6.0 Technetium-24m MAA IV COMPARISON:  One-view chest x-ray from the same day. FINDINGS: Ventilation: There is absent undulation in the left lower lobe. There is also segmental defect in the superior segment of the left upper lobe. The right lung uptake is  normal. Perfusion: Absent per fusion is matched in the left lower lobe. A matched defect is present in the superior  segment of the left upper lobe. The right lung perfuses normally. There is some clumping of radiopharmaceutical at the hilum. IMPRESSION: 1. Matched left lower lobe and left upper lobe defects. This represents a very low probability (0 - 9%) of pulmonary embolus using Pioped II criteria. Electronically Signed   By: San Morelle M.D.   On: 08/07/2015 10:22       Filed Weights   08/08/15 2142 08/10/15 2100 08/11/15 2118  Weight: 66.9 kg (147 lb 7.8 oz) 68.221 kg (150 lb 6.4 oz) 64.048 kg (141 lb 3.2 oz)     Microbiology: No results found for this or any previous visit (from the past 240 hour(s)).     Blood Culture    Component Value Date/Time   SDES URINE, CLEAN CATCH 03/30/2012 1158   SPECREQUEST NONE 03/30/2012 1158   CULT  03/30/2012 1158    Multiple bacterial morphotypes present, none predominant. Suggest appropriate recollection if clinically indicated.   REPTSTATUS 04/01/2012 FINAL 03/30/2012 1158      Labs: Results for orders placed or performed during the hospital encounter of 08/05/15 (from the past 48 hour(s))  CBC     Status: Abnormal   Collection Time: 08/11/15  4:38 AM  Result Value Ref Range   WBC 8.3 4.0 - 10.5 K/uL   RBC 4.05 (L) 4.22 - 5.81 MIL/uL   Hemoglobin 12.3 (L) 13.0 - 17.0 g/dL   HCT 37.8 (L) 39.0 - 52.0 %   MCV 93.3 78.0 - 100.0 fL   MCH 30.4 26.0 - 34.0 pg   MCHC 32.5 30.0 - 36.0 g/dL   RDW 17.5 (H) 11.5 - 15.5 %   Platelets 157 150 - 400 K/uL  Heparin level (unfractionated)     Status: None   Collection Time: 08/11/15  4:38 AM  Result Value Ref Range   Heparin Unfractionated 0.53 0.30 - 0.70 IU/mL    Comment:        IF HEPARIN RESULTS ARE BELOW EXPECTED VALUES, AND PATIENT DOSAGE HAS BEEN CONFIRMED, SUGGEST FOLLOW UP TESTING OF ANTITHROMBIN III LEVELS.   Comprehensive metabolic panel     Status: Abnormal   Collection Time: 08/11/15  4:38 AM  Result Value Ref Range   Sodium 145 135 - 145 mmol/L   Potassium 3.9 3.5 - 5.1 mmol/L    Chloride 108 101 - 111 mmol/L   CO2 25 22 - 32 mmol/L   Glucose, Bld 116 (H) 65 - 99 mg/dL   BUN 72 (H) 6 - 20 mg/dL   Creatinine, Ser 6.58 (H) 0.61 - 1.24 mg/dL   Calcium 8.5 (L) 8.9 - 10.3 mg/dL   Total Protein 5.8 (L) 6.5 - 8.1 g/dL   Albumin 3.1 (L) 3.5 - 5.0 g/dL   AST 28 15 - 41 U/L   ALT 23 17 - 63 U/L   Alkaline Phosphatase 36 (L) 38 - 126 U/L   Total Bilirubin 0.4 0.3 - 1.2 mg/dL   GFR calc non Af Amer 7 (L) >60 mL/min   GFR calc Af Amer 8 (L) >60 mL/min    Comment: (NOTE) The eGFR has been calculated using the CKD EPI equation. This calculation has not been validated in all clinical situations. eGFR's persistently <60 mL/min signify possible Chronic Kidney Disease.    Anion gap 12 5 - 15  Protime-INR     Status: Abnormal   Collection Time:  08/11/15  4:38 AM  Result Value Ref Range   Prothrombin Time 18.4 (H) 11.6 - 15.2 seconds   INR 1.53 (H) 0.00 - 1.49  Heparin level (unfractionated)     Status: None   Collection Time: 08/12/15  4:16 AM  Result Value Ref Range   Heparin Unfractionated 0.49 0.30 - 0.70 IU/mL    Comment:        IF HEPARIN RESULTS ARE BELOW EXPECTED VALUES, AND PATIENT DOSAGE HAS BEEN CONFIRMED, SUGGEST FOLLOW UP TESTING OF ANTITHROMBIN III LEVELS.   Comprehensive metabolic panel     Status: Abnormal   Collection Time: 08/12/15  4:16 AM  Result Value Ref Range   Sodium 141 135 - 145 mmol/L   Potassium 4.3 3.5 - 5.1 mmol/L   Chloride 100 (L) 101 - 111 mmol/L   CO2 31 22 - 32 mmol/L   Glucose, Bld 125 (H) 65 - 99 mg/dL   BUN 73 (H) 6 - 20 mg/dL   Creatinine, Ser 6.54 (H) 0.61 - 1.24 mg/dL   Calcium 8.3 (L) 8.9 - 10.3 mg/dL   Total Protein 5.7 (L) 6.5 - 8.1 g/dL   Albumin 2.9 (L) 3.5 - 5.0 g/dL   AST 30 15 - 41 U/L   ALT 27 17 - 63 U/L   Alkaline Phosphatase 33 (L) 38 - 126 U/L   Total Bilirubin 0.4 0.3 - 1.2 mg/dL   GFR calc non Af Amer 7 (L) >60 mL/min   GFR calc Af Amer 8 (L) >60 mL/min    Comment: (NOTE) The eGFR has been  calculated using the CKD EPI equation. This calculation has not been validated in all clinical situations. eGFR's persistently <60 mL/min signify possible Chronic Kidney Disease.    Anion gap 10 5 - 15  Protime-INR     Status: Abnormal   Collection Time: 08/12/15  4:16 AM  Result Value Ref Range   Prothrombin Time 23.0 (H) 11.6 - 15.2 seconds   INR 2.05 (H) 0.00 - 1.49     Lipid Panel  No results found for: CHOL, TRIG, HDL, CHOLHDL, VLDL, LDLCALC, LDLDIRECT   Lab Results  Component Value Date   HGBA1C 5.5 08/05/2015   HGBA1C 5.9* 03/30/2012     Lab Results  Component Value Date   CREATININE 6.54* 08/12/2015     HPI :*81 year old male with past medical history significant for multiple medical problems, i.e., coronary artery disease, history of inferior wall MI in the past, status post CABG, history of CVA or aortic stenosis status post bioprosthetic AVR, history of nonsustained VT and SVT in the past, hypertension, hyperlipidemia, chronic kidney disease, dementia, Was admitted on 08/05/2015 because of dizziness. Failure to thrive and poor appetite associated with coughing . Patient denies any chest pain. Cardiology consultation is called. As patient was noted to have atrial flutter with 2 to one block, and minimally elevated troponin.patient also was noted to have worsening renal function and has opted for medical management only in the past and has refused for hemodialysis. Patient also has stated in the past regarding DNR.  HOSPITAL COURSE:   Acute on chronic kidney disease stage V Patient creatinine baseline was 4.5 about 2 years ago in May 2014. Patient followed by Dr.Colodonato Creatinine around 6.54 now, nephrology. Patient's Lasix discontinued, currently on IV sodium bicarbonate Because of dementia Nephrology does not recommend any dialysis at this time Defer fluid management to nephrology   Probable community-acquired pneumonia CT scan does not show any  evidence of pneumonia, no  pleural effusion, no vascular congestion Concern for aspiration, however patient is not hypoxic and appears comfortable, therefore discontinued antibiotics after today's, patient remained stable and afebrile Speech therapy consultation Dysphagia 3 (Mech soft);Nectar    Chronic systolic/diastolic heart failure, does not appear to be in acute exacerbation  EF 45-50% on repeat echo, Lasix discontinued given low BP by nephrology and worsening renal function Discontinued bidil due to hypotension on 10/14. This can be restarted if the patient's blood pressure continues to increase  Lightheadedness, could be related to new onset atrial flutter ,Cardiology Dr. Criss Rosales has been consulted EKG shows atrial fibrillation from 10/13, repeat EKG shows atrial flutter, 2-D echo shows EF of 45-50%, patient closely followed by cardiology, needed titration of amiodarone and [place] Cardiology recommended anticoagulation with heparin/Coumadin INR therapeutic prior to discharge, PT/OT recommended SNF. MRI of the brain negative for acute CVA but showed old infarcts   New-onset atrial flutter with RVR.History of severe aortic stenosis, status post bioprosthetic aortic valve replacement chadsvas score of 8 Cardiology, will continue to adjust amiodarone and Coreg VQ scan negative for PE Cardiology recommended anticoagulation with heparin drip/Coumadin, until INR therapeutic INR 2.05 prior to discharge  Hypertension controlled continue Coreg and hold BiDil  Diabetes mellitus type 2 Diet-controlled, hemoglobin A1c 5.5, carbohydrate modified diet.  Status post aortic valve replacement Bioprosthetic valve, cardiology following     Discharge Exam:    Blood pressure 155/96, pulse 110, temperature 97.5 F (36.4 C), temperature source Oral, resp. rate 17, height $RemoveBe'5\' 10"'hwNctphih$  (1.778 m), weight 64.048 kg (141 lb 3.2 oz), SpO2 97 %. General: No acute respiratory distress Lungs: Clear to  auscultation bilaterally without wheezes or crackles Cardiovascular: Regular rate and rhythm without murmur gallop or rub normal S1 and S2 Abdomen: Nontender, nondistended, soft, bowel sounds positive, no rebound, no ascites, no appreciable mass Extremities: No significant cyanosis, clubbing, or edema bilateral lower extremities        Discharge Instructions    Diet - low sodium heart healthy    Complete by:  As directed      Diet - low sodium heart healthy    Complete by:  As directed      Diet - low sodium heart healthy    Complete by:  As directed      Increase activity slowly    Complete by:  As directed      Increase activity slowly    Complete by:  As directed      Increase activity slowly    Complete by:  As directed            Follow-up Information    Follow up with Charolette Forward, MD. Schedule an appointment as soon as possible for a visit in 3 days.   Specialty:  Cardiology   Contact information:   [place] [address] [phone]       Follow up with Charolette Forward, MD. Call in 3 days.   Specialty:  Cardiology   Contact information:   [place] [address] [phone]       Signed: Reyne Dumas 08/12/2015, 1:04 PM        Time spent >45 mins

## 2015-08-12 NOTE — Progress Notes (Signed)
Social Worker Erie NoeVanessa made aware of SNIF d/c today during progression.

## 2015-08-12 NOTE — Progress Notes (Signed)
Daily Progress Note   Patient Name: [name] [name]       Date: 08/12/2015 DOB: July 25, 1933  Age: 80 y.o. MRN#: [id number] Attending Physician: Richarda Overlie, MD Primary Care Physician: Rinaldo Cloud, MD Admit Date: 08/05/2015  Reason for Consultation/Follow-up: Establishing goals of care  Subjective:     I met with [name] [name] today and he is alert and very pleasant gentleman. He is very happy and has no complaints. He does not remember he is in the hospital with worsening renal failure although he knows he is in the hospital (looks to the wall to tell me [facility]).   His sister in law [name] [name] comes to bedside and we discuss my concern over his labs and the fact he is likely having very little urine output. She believe his swelling appears worse. She also says that she feels better with her decision after our conversation yesterday. I was sharing my concerns when visitors came in.    Length of Stay: 7 days  Current Medications: Scheduled Meds:  . amiodarone  200 mg Oral BID  . calcitRIOL  0.25 mcg Oral Daily  . carvedilol  12.5 mg Oral BID WC  . donepezil  5 mg Oral QHS  . feeding supplement (NEPRO CARB STEADY)  237 mL Oral Q24H  . sodium chloride  3 mL Intravenous Q12H  . Warfarin - Pharmacist Dosing Inpatient   Does not apply q1800    Continuous Infusions: .  sodium bicarbonate 150 mEq in sterile water 1000 mL infusion 150 mEq (08/12/15 0028)    PRN Meds: acetaminophen **OR** acetaminophen, hydrALAZINE, metoprolol, morphine injection, ondansetron **OR** ondansetron (ZOFRAN) IV, RESOURCE THICKENUP CLEAR  Palliative Performance Scale: 30%     Vital Signs: BP 145/85 mmHg  Pulse 105  Temp(Src) 98 F (36.7 C) (Oral)  Resp 17  Ht 5\' 10"  (1.778 m)  Wt 64.048 kg (141 lb 3.2 oz)  BMI 20.26 kg/m2  SpO2 99% SpO2: SpO2: 99 % O2 Device: O2 Device: Not Delivered O2 Flow Rate:    Intake/output summary:   Intake/Output Summary (Last 24 hours) at 08/12/15 1811 Last data  filed at 08/12/15 1620  Gross per 24 hour  Intake 2597.5 ml  Output    450 ml  Net 2147.5 ml   LBM: Last BM Date: 08/12/15 Baseline Weight: Weight: 63.866 kg (140 lb 12.8 oz) Most recent weight: Weight: 64.048 kg (141 lb 3.2 oz)  Physical Exam:              General: NAD, sitting up in bed, pleasant HEENT: [place]/AT Resp: No labored breathing Abd: Soft, NT, ND Neuro: Confused but knows who he is and that he is in hospital but not what for   Additional Data Reviewed: Recent Labs     08/10/15  0500  08/11/15  0438  08/12/15  0416  WBC  7.8  8.3   --   HGB  12.5*  12.3*   --   PLT  165  157   --   NA  141  145  141  BUN  75*  72*  73*  CREATININE  6.04*  6.58*  6.54*     Problem List:  Patient Active Problem List   Diagnosis Date Noted  . Dyspnea   . Palliative care encounter   . Protein-calorie malnutrition, severe 08/06/2015  . Pneumonia 08/05/2015  . Diabetes mellitus type 2, diet-controlled (HCC) 08/05/2015  . Dizziness 08/05/2015  . CAD (coronary artery disease) 08/05/2015  . Status post  aortic valve replacement 08/05/2015  . Chronic kidney disease (CKD), stage IV (severe) ([facility]) 08/09/2013     Palliative Care Assessment & Plan    Code Status:  DNR  Goals of Care:  I was unable to further discuss hospice with surrogate decision maker as visitors entered the room.   3. Symptom Management:  Denies any pain/discomfort.    5. Prognosis: Could be weeks with worsening renal function and likely little urine output.   5. Discharge Planning: I was unable to discuss hospice facility with as visitors entered the room and cut our conversation short.    Thank you for allowing the Palliative Medicine Team to assist in the care of this patient.   Time In: 1200 Time Out: 1230 Total Time 48min Prolonged Time Billed  no     Greater than 50%  of this time was spent counseling and coordinating care related to the above assessment and plan.   Vinie Sill,  NP Palliative Medicine Team Pager # [phone] (M-F 8a-5p) Team Phone # [phone] (Nights/Weekends)  08/12/2015, 6:11 PM

## 2015-08-12 NOTE — Progress Notes (Signed)
Subjective:  Denies any chest pain or shortness of breath.  Continues to have chronic cough.  Objective:  Vital Signs in the last 24 hours: Temp:  [97.6 F (36.4 C)-97.9 F (36.6 C)] 97.6 F (36.4 C) (10/19 0641) Pulse Rate:  [76-110] 107 (10/19 0641) Resp:  [16-17] 16 (10/19 0641) BP: (128-165)/(89-122) 165/122 mmHg (10/19 0641) SpO2:  [96 %-99 %] 96 % (10/19 0641) Weight:  [64.048 kg (141 lb 3.2 oz)] 64.048 kg (141 lb 3.2 oz) (10/18 2118)  Intake/Output from previous day: 10/18 0701 - 10/19 0700 In: 4290 [P.O.:360; I.V.:3930] Out: 900 [Urine:900] Intake/Output from this shift:    Physical Exam: Neck: no adenopathy, no carotid bruit, no JVD and supple, symmetrical, trachea midline Lungs: decreased breath sounds at bases with occasional rhonchi Heart: regularly irregular rhythm, S1, S2 normal and soft systolic murmur noted Abdomen: soft, non-tender; bowel sounds normal; no masses,  no organomegaly Extremities: extremities normal, atraumatic, no cyanosis or edema  Lab Results:  Recent Labs  08/10/15 0500 08/11/15 0438  WBC 7.8 8.3  HGB 12.5* 12.3*  PLT 165 157    Recent Labs  08/11/15 0438 08/12/15 0416  NA 145 141  K 3.9 4.3  CL 108 100*  CO2 25 31  GLUCOSE 116* 125*  BUN 72* 73*  CREATININE 6.58* 6.54*   No results for input(s): TROPONINI in the last 72 hours.  Invalid input(s): CK, MB Hepatic Function Panel  Recent Labs  08/12/15 0416  PROT 5.7*  ALBUMIN 2.9*  AST 30  ALT 27  ALKPHOS 33*  BILITOT 0.4   No results for input(s): CHOL in the last 72 hours. No results for input(s): PROTIME in the last 72 hours.  Imaging: Imaging results have been reviewed and No results found.  Cardiac Studies:  Assessment/Plan:  New-onset atrial flutter with controlled ventricular response.chadsvas score of 8 Coronary artery disease status post CABG in the past. History of severe aortic stenosis, status post bioprosthetic aortic valve  replacement. Minimally elevated troponin I secondary to progressive renal failure/tachycardia without significant MI Bronchitis rule out pneumonia Possible dysphagia Hypertension. Diabetes mellitus History of nonsustained ventricular tachycardia and SVT in the past. Chronic progressive kidney disease stage IV. Hyperlipidemia. Dementia. Chronic thrombocytopenia History of old CVA Plan Reduce amiodarone to 200 mg twice daily. Increase carvedilol to 12.5 mg twice daily. DC heparin. Okay to discharge from cardiac point of view. Follow-up with me in 2 weeks. Check PT/INR on Friday  LOS: 7 days    [name] CloudHarwani, [name] [name] 08/12/2015, 7:29 AM

## 2015-08-12 NOTE — Progress Notes (Signed)
Assessment/Plan: 1. 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 Stop furosemide   2. New  Atrial Fib/A flutter- per cardiology(Amio,coreg,coumadin) 3. HTN- stable 4. CHF- resolved 5. Protein malnutrition- renal diet 6. FTT/Dementia...not sure how much of this is uremia , may need competency eval.  Lives alone.  Subjective: Interval History: no complaints  Objective: Vital signs in last 24 hours: Temp:  [97.5 F (36.4 C)-97.9 F (36.6 C)] 97.5 F (36.4 C) (10/19 0852) Pulse Rate:  [107-110] 110 (10/19 0852) Resp:  [16-17] 17 (10/19 0852) BP: (128-165)/(89-122) 155/96 mmHg (10/19 0852) SpO2:  [96 %-99 %] 97 % (10/19 0852) Weight:  [64.048 kg (141 lb 3.2 oz)] 64.048 kg (141 lb 3.2 oz) (10/18 2118) Weight change: -4.173 kg (-9 lb 3.2 oz)  Intake/Output from previous day: 10/18 0701 - 10/19 0700 In: 4290 [P.O.:360; I.V.:3930] Out: 900 [Urine:900] Intake/Output this shift: Total I/O In: 240 [P.O.:240] Out: 150 [Urine:150]  General appearance: alert and cooperative Resp: clear to auscultation bilaterally Chest wall: no tenderness Cardio: regular rate and rhythm, S1, S2 normal, no murmur, click, rub or gallop GI: soft, non-tender; bowel sounds normal; no masses,  no organomegaly Extremities: extremities normal, atraumatic, no cyanosis or edema  Lab Results:  Recent Labs  08/10/15 0500 08/11/15 0438  WBC 7.8 8.3  HGB 12.5* 12.3*  HCT 38.2* 37.8*  PLT 165 157   BMET:  Recent Labs  08/11/15 0438 08/12/15 0416  NA 145 141  K 3.9 4.3  CL 108 100*  CO2 25 31  GLUCOSE 116* 125*  BUN 72* 73*  CREATININE 6.58* 6.54*  CALCIUM 8.5* 8.3*   No results for input(s): PTH in the last 72 hours. Iron Studies: No results for input(s): IRON, TIBC, TRANSFERRIN, FERRITIN in the last 72 hours. Studies/Results: No results found.  Scheduled: . amiodarone  400 mg Oral BID  . calcitRIOL  0.25 mcg Oral Daily   . carvedilol  6.25 mg Oral BID WC  . donepezil  5 mg Oral QHS  . feeding supplement (NEPRO CARB STEADY)  237 mL Oral Q24H  . sodium chloride  3 mL Intravenous Q12H  . warfarin  5 mg Oral ONCE-1800  . Warfarin - Pharmacist Dosing Inpatient   Does not apply q1800     LOS: 7 days   Mika Anastasi C 08/12/2015,10:32 AM

## 2015-08-12 NOTE — Clinical Social Work Placement (Signed)
   CLINICAL SOCIAL WORK PLACEMENT  NOTE 08/12/15 - DISCHARGE TO [facility]  Date:  08/12/2015  Patient Details  Name: [name] GammaClarence E Leonardo MRN: [id number] Date of Birth: 01/20/1933  Clinical Social Work is seeking post-discharge placement for this patient at the Skilled  Nursing Facility level of care (*CSW will initial, date and re-position this form in  chart as items are completed):  Yes   Patient/family provided with [facility] Clinical Social Work Department's list of facilities offering this level of care within the geographic area requested by the patient (or if unable, by the patient's family).  Yes   Patient/family informed of their freedom to choose among providers that offer the needed level of care, that participate in Medicare, Medicaid or managed care program needed by the patient, have an available bed and are willing to accept the patient.  No   Patient/family informed of 's ownership interest [facility] and [facility], as well as of the fact that they are under no obligation to receive care at these facilities.  PASRR submitted to EDS on 08/07/15     PASRR number received on  08/07/15     Existing PASRR number confirmed on       FL2 transmitted to all facilities in geographic area requested by pt/family on 08/07/15     FL2 transmitted to all facilities within larger geographic area on       Patient informed that his/her managed care company has contracts with or will negotiate with certain facilities, including the following:         08/07/15 - Patient/family informed of bed offers received.  [facility]     Physician recommends and patient chooses bed at      [facility].  Patient to be transferred to facility by  ambulance     Patient family notified on  08/12/15 of transfer.  Name of family member notified:   Marta AntuFaye Thorne (sister-in-law) [phone]      PHYSICIAN Please prepare priority discharge summary, including medications, Please prepare prescriptions     Additional Comment:    _______________________________________________ Cristobal Goldmannrawford, Young Brim Bradley, LCSW 08/12/2015, 3:02 PM

## 2015-08-12 NOTE — Progress Notes (Addendum)
ANTICOAGULATION CONSULT NOTE   Pharmacy Consult for Heparin + warfarin Indication: atrial fibrillation  Allergies  Allergen Reactions  . Clams [Shellfish Allergy]     Gout     Patient Measurements: Height: 5\' 10"  (177.8 cm) Weight: 141 lb 3.2 oz (64.048 kg) IBW/kg (Calculated) : 73   Vital Signs: Temp: 97.6 F (36.4 C) (10/19 0641) Temp Source: Oral (10/19 0641) BP: 165/122 mmHg (10/19 0641) Pulse Rate: 107 (10/19 0641)  Labs:  Recent Labs  08/10/15 0500 08/11/15 0438 08/12/15 0416  HGB 12.5* 12.3*  --   HCT 38.2* 37.8*  --   PLT 165 157  --   LABPROT 15.7* 18.4* 23.0*  INR 1.23 1.53* 2.05*  HEPARINUNFRC 0.45 0.53 0.49  CREATININE 6.04* 6.58* 6.54*    Estimated Creatinine Clearance: 8.2 mL/min (by C-G formula based on Cr of 6.54).  Assessment: 81 yo M with dementia and ARF with new onset AFib. He was started on IV heparin and warfarin for anticoagulation. CHADSVASC = 8, and not rate controlled. Not a good candidate for Lovenox d/t ARF. INR is therapeutic this morning at 2.05. HL remains therapeutic at 0.49 units/mL. Hgb 12.3, ptls 157- both stable and no bleeding noted.  Goal of Therapy:  INR 2-3 Heparin level 0.3-0.7 units/ml Monitor platelets by anticoagulation protocol: Yes   Plan:  -Continue heparin gtt at 750 units/hr -Recommend discharge warfarin dose of 5mg  daily with an INR check on Friday 10/21 (a dose for 5mg  has been entered for tonight to be given if he is not discharged) -Monitor daily HL / INR, CBC while he is here in the hospital -Follow for s/s of bleed  Poet Hineman D. Laureano Hetzer, PharmD, BCPS Clinical Pharmacist Pager: [phone] 08/12/2015 8:20 AM

## 2015-09-24 DEATH — deceased
